# Patient Record
Sex: Female | Born: 1955 | Race: Black or African American | Hispanic: No | Marital: Married | Smoking: Never smoker
Health system: Southern US, Community
[De-identification: ages and names within clinical notes are randomized; demographics above are authoritative.]

## PROBLEM LIST (undated history)

## (undated) DIAGNOSIS — I10 Essential (primary) hypertension: Secondary | ICD-10-CM

## (undated) HISTORY — PX: EYE SURGERY: SHX253

---

## 1998-06-09 ENCOUNTER — Encounter: Admission: RE | Admit: 1998-06-09 | Discharge: 1998-06-09 | Payer: Self-pay | Admitting: Family Medicine

## 1998-07-05 ENCOUNTER — Encounter: Admission: RE | Admit: 1998-07-05 | Discharge: 1998-07-05 | Payer: Self-pay | Admitting: Family Medicine

## 1998-08-22 ENCOUNTER — Encounter: Admission: RE | Admit: 1998-08-22 | Discharge: 1998-08-22 | Payer: Self-pay | Admitting: Family Medicine

## 1998-08-29 ENCOUNTER — Encounter: Admission: RE | Admit: 1998-08-29 | Discharge: 1998-11-27 | Payer: Self-pay | Admitting: *Deleted

## 1998-09-19 ENCOUNTER — Encounter: Admission: RE | Admit: 1998-09-19 | Discharge: 1998-09-19 | Payer: Self-pay | Admitting: Family Medicine

## 1999-02-02 ENCOUNTER — Encounter: Admission: RE | Admit: 1999-02-02 | Discharge: 1999-02-02 | Payer: Self-pay | Admitting: Family Medicine

## 1999-02-07 ENCOUNTER — Encounter: Admission: RE | Admit: 1999-02-07 | Discharge: 1999-02-07 | Payer: Self-pay | Admitting: Family Medicine

## 1999-02-08 ENCOUNTER — Encounter: Admission: RE | Admit: 1999-02-08 | Discharge: 1999-02-08 | Payer: Self-pay | Admitting: Family Medicine

## 1999-05-24 ENCOUNTER — Encounter: Admission: RE | Admit: 1999-05-24 | Discharge: 1999-05-24 | Payer: Self-pay | Admitting: Family Medicine

## 1999-06-19 ENCOUNTER — Encounter: Admission: RE | Admit: 1999-06-19 | Discharge: 1999-06-19 | Payer: Self-pay | Admitting: Sports Medicine

## 1999-10-02 ENCOUNTER — Encounter: Admission: RE | Admit: 1999-10-02 | Discharge: 1999-10-02 | Payer: Self-pay | Admitting: Sports Medicine

## 1999-10-05 ENCOUNTER — Encounter: Admission: RE | Admit: 1999-10-05 | Discharge: 1999-10-05 | Payer: Self-pay | Admitting: Family Medicine

## 1999-10-31 ENCOUNTER — Encounter: Admission: RE | Admit: 1999-10-31 | Discharge: 1999-10-31 | Payer: Self-pay | Admitting: Family Medicine

## 1999-12-21 ENCOUNTER — Encounter: Admission: RE | Admit: 1999-12-21 | Discharge: 1999-12-21 | Payer: Self-pay | Admitting: Family Medicine

## 2000-01-04 ENCOUNTER — Encounter: Admission: RE | Admit: 2000-01-04 | Discharge: 2000-01-04 | Payer: Self-pay | Admitting: Family Medicine

## 2000-02-01 ENCOUNTER — Encounter: Admission: RE | Admit: 2000-02-01 | Discharge: 2000-02-01 | Payer: Self-pay | Admitting: Family Medicine

## 2000-02-18 ENCOUNTER — Encounter: Admission: RE | Admit: 2000-02-18 | Discharge: 2000-02-18 | Payer: Self-pay | Admitting: Family Medicine

## 2000-02-19 ENCOUNTER — Ambulatory Visit (HOSPITAL_COMMUNITY): Admission: RE | Admit: 2000-02-19 | Discharge: 2000-02-19 | Payer: Self-pay | Admitting: *Deleted

## 2000-05-01 ENCOUNTER — Encounter: Admission: RE | Admit: 2000-05-01 | Discharge: 2000-05-01 | Payer: Self-pay | Admitting: Family Medicine

## 2000-05-02 ENCOUNTER — Emergency Department (HOSPITAL_COMMUNITY): Admission: EM | Admit: 2000-05-02 | Discharge: 2000-05-02 | Payer: Self-pay | Admitting: Emergency Medicine

## 2000-05-28 ENCOUNTER — Encounter: Admission: RE | Admit: 2000-05-28 | Discharge: 2000-05-28 | Payer: Self-pay | Admitting: Family Medicine

## 2000-06-17 ENCOUNTER — Encounter: Admission: RE | Admit: 2000-06-17 | Discharge: 2000-06-17 | Payer: Self-pay | Admitting: Sports Medicine

## 2000-06-24 ENCOUNTER — Encounter: Admission: RE | Admit: 2000-06-24 | Discharge: 2000-06-24 | Payer: Self-pay | Admitting: Sports Medicine

## 2000-07-08 ENCOUNTER — Encounter: Admission: RE | Admit: 2000-07-08 | Discharge: 2000-07-08 | Payer: Self-pay | Admitting: Family Medicine

## 2000-07-22 ENCOUNTER — Encounter: Admission: RE | Admit: 2000-07-22 | Discharge: 2000-07-22 | Payer: Self-pay | Admitting: Family Medicine

## 2000-08-12 ENCOUNTER — Encounter: Admission: RE | Admit: 2000-08-12 | Discharge: 2000-08-12 | Payer: Self-pay | Admitting: Family Medicine

## 2000-09-11 ENCOUNTER — Encounter: Admission: RE | Admit: 2000-09-11 | Discharge: 2000-09-11 | Payer: Self-pay | Admitting: Family Medicine

## 2000-09-30 ENCOUNTER — Encounter: Admission: RE | Admit: 2000-09-30 | Discharge: 2000-09-30 | Payer: Self-pay | Admitting: Family Medicine

## 2000-11-17 ENCOUNTER — Encounter: Admission: RE | Admit: 2000-11-17 | Discharge: 2000-11-17 | Payer: Self-pay | Admitting: Family Medicine

## 2000-11-25 ENCOUNTER — Encounter: Admission: RE | Admit: 2000-11-25 | Discharge: 2000-11-25 | Payer: Self-pay | Admitting: Sports Medicine

## 2001-02-02 ENCOUNTER — Emergency Department (HOSPITAL_COMMUNITY): Admission: EM | Admit: 2001-02-02 | Discharge: 2001-02-02 | Payer: Self-pay | Admitting: Emergency Medicine

## 2001-04-24 ENCOUNTER — Encounter: Admission: RE | Admit: 2001-04-24 | Discharge: 2001-04-24 | Payer: Self-pay | Admitting: Family Medicine

## 2001-04-28 ENCOUNTER — Encounter: Admission: RE | Admit: 2001-04-28 | Discharge: 2001-04-28 | Payer: Self-pay | Admitting: Family Medicine

## 2001-04-28 ENCOUNTER — Encounter: Payer: Self-pay | Admitting: Family Medicine

## 2001-07-01 ENCOUNTER — Encounter: Admission: RE | Admit: 2001-07-01 | Discharge: 2001-07-01 | Payer: Self-pay | Admitting: Family Medicine

## 2001-10-05 ENCOUNTER — Encounter: Admission: RE | Admit: 2001-10-05 | Discharge: 2001-10-05 | Payer: Self-pay | Admitting: Sports Medicine

## 2001-10-23 ENCOUNTER — Encounter: Admission: RE | Admit: 2001-10-23 | Discharge: 2001-10-23 | Payer: Self-pay | Admitting: Family Medicine

## 2001-12-22 ENCOUNTER — Encounter: Admission: RE | Admit: 2001-12-22 | Discharge: 2002-03-22 | Payer: Self-pay | Admitting: *Deleted

## 2002-01-01 ENCOUNTER — Encounter: Admission: RE | Admit: 2002-01-01 | Discharge: 2002-01-01 | Payer: Self-pay | Admitting: Family Medicine

## 2002-01-19 ENCOUNTER — Encounter: Admission: RE | Admit: 2002-01-19 | Discharge: 2002-01-19 | Payer: Self-pay | Admitting: Family Medicine

## 2002-05-21 ENCOUNTER — Encounter: Admission: RE | Admit: 2002-05-21 | Discharge: 2002-05-21 | Payer: Self-pay | Admitting: Family Medicine

## 2002-06-10 ENCOUNTER — Encounter: Admission: RE | Admit: 2002-06-10 | Discharge: 2002-06-10 | Payer: Self-pay | Admitting: Family Medicine

## 2002-10-18 ENCOUNTER — Emergency Department (HOSPITAL_COMMUNITY): Admission: EM | Admit: 2002-10-18 | Discharge: 2002-10-18 | Payer: Self-pay | Admitting: Emergency Medicine

## 2002-10-27 ENCOUNTER — Encounter: Admission: RE | Admit: 2002-10-27 | Discharge: 2002-10-27 | Payer: Self-pay | Admitting: Family Medicine

## 2003-01-05 ENCOUNTER — Encounter: Admission: RE | Admit: 2003-01-05 | Discharge: 2003-01-05 | Payer: Self-pay | Admitting: Family Medicine

## 2003-04-04 ENCOUNTER — Encounter: Admission: RE | Admit: 2003-04-04 | Discharge: 2003-04-04 | Payer: Self-pay | Admitting: Family Medicine

## 2003-05-11 ENCOUNTER — Encounter: Admission: RE | Admit: 2003-05-11 | Discharge: 2003-05-11 | Payer: Self-pay | Admitting: Sports Medicine

## 2003-05-11 ENCOUNTER — Encounter: Payer: Self-pay | Admitting: Sports Medicine

## 2003-06-21 ENCOUNTER — Encounter: Payer: Self-pay | Admitting: Neurosurgery

## 2003-06-21 ENCOUNTER — Encounter: Admission: RE | Admit: 2003-06-21 | Discharge: 2003-06-21 | Payer: Self-pay | Admitting: Neurosurgery

## 2003-07-21 ENCOUNTER — Encounter: Admission: RE | Admit: 2003-07-21 | Discharge: 2003-07-21 | Payer: Self-pay | Admitting: Family Medicine

## 2003-09-26 ENCOUNTER — Encounter: Admission: RE | Admit: 2003-09-26 | Discharge: 2003-09-26 | Payer: Self-pay | Admitting: Family Medicine

## 2003-10-14 ENCOUNTER — Encounter: Admission: RE | Admit: 2003-10-14 | Discharge: 2003-10-14 | Payer: Self-pay | Admitting: Family Medicine

## 2003-11-09 ENCOUNTER — Emergency Department (HOSPITAL_COMMUNITY): Admission: EM | Admit: 2003-11-09 | Discharge: 2003-11-09 | Payer: Self-pay | Admitting: Emergency Medicine

## 2003-12-01 ENCOUNTER — Encounter: Admission: RE | Admit: 2003-12-01 | Discharge: 2003-12-01 | Payer: Self-pay | Admitting: Family Medicine

## 2003-12-16 ENCOUNTER — Encounter: Admission: RE | Admit: 2003-12-16 | Discharge: 2003-12-16 | Payer: Self-pay | Admitting: Family Medicine

## 2003-12-23 ENCOUNTER — Encounter: Admission: RE | Admit: 2003-12-23 | Discharge: 2003-12-23 | Payer: Self-pay | Admitting: Neurosurgery

## 2004-03-09 ENCOUNTER — Encounter (INDEPENDENT_AMBULATORY_CARE_PROVIDER_SITE_OTHER): Payer: Self-pay | Admitting: *Deleted

## 2004-03-09 LAB — CONVERTED CEMR LAB

## 2004-03-19 ENCOUNTER — Emergency Department (HOSPITAL_COMMUNITY): Admission: EM | Admit: 2004-03-19 | Discharge: 2004-03-19 | Payer: Self-pay

## 2004-03-30 ENCOUNTER — Encounter: Admission: RE | Admit: 2004-03-30 | Discharge: 2004-03-30 | Payer: Self-pay | Admitting: Family Medicine

## 2004-04-12 ENCOUNTER — Encounter: Admission: RE | Admit: 2004-04-12 | Discharge: 2004-04-12 | Payer: Self-pay | Admitting: Family Medicine

## 2004-09-07 ENCOUNTER — Ambulatory Visit: Payer: Self-pay

## 2004-12-06 ENCOUNTER — Ambulatory Visit: Payer: Self-pay | Admitting: Family Medicine

## 2005-03-25 ENCOUNTER — Ambulatory Visit: Payer: Self-pay | Admitting: Sports Medicine

## 2005-06-28 ENCOUNTER — Ambulatory Visit: Payer: Self-pay | Admitting: Family Medicine

## 2005-09-18 ENCOUNTER — Ambulatory Visit: Payer: Self-pay | Admitting: Family Medicine

## 2006-02-04 ENCOUNTER — Ambulatory Visit: Payer: Self-pay | Admitting: Family Medicine

## 2006-06-05 ENCOUNTER — Emergency Department (HOSPITAL_COMMUNITY): Admission: EM | Admit: 2006-06-05 | Discharge: 2006-06-05 | Payer: Self-pay | Admitting: Emergency Medicine

## 2006-07-02 ENCOUNTER — Ambulatory Visit: Payer: Self-pay | Admitting: Sports Medicine

## 2006-09-02 ENCOUNTER — Ambulatory Visit: Payer: Self-pay | Admitting: Family Medicine

## 2006-10-08 ENCOUNTER — Ambulatory Visit: Payer: Self-pay | Admitting: Family Medicine

## 2006-12-12 ENCOUNTER — Ambulatory Visit: Payer: Self-pay | Admitting: Family Medicine

## 2007-01-16 ENCOUNTER — Encounter (INDEPENDENT_AMBULATORY_CARE_PROVIDER_SITE_OTHER): Payer: Self-pay | Admitting: Family Medicine

## 2007-01-16 ENCOUNTER — Ambulatory Visit: Payer: Self-pay | Admitting: Family Medicine

## 2007-01-16 LAB — CONVERTED CEMR LAB
BUN: 10 mg/dL (ref 6–23)
CO2: 25 meq/L (ref 19–32)
Calcium: 9.4 mg/dL (ref 8.4–10.5)
Chloride: 102 meq/L (ref 96–112)
Creatinine, Ser: 0.74 mg/dL (ref 0.40–1.20)
Glucose, Bld: 240 mg/dL — ABNORMAL HIGH (ref 70–99)
Potassium: 3.6 meq/L (ref 3.5–5.3)
Sodium: 136 meq/L (ref 135–145)

## 2007-02-05 DIAGNOSIS — IMO0002 Reserved for concepts with insufficient information to code with codable children: Secondary | ICD-10-CM | POA: Insufficient documentation

## 2007-02-05 DIAGNOSIS — I10 Essential (primary) hypertension: Secondary | ICD-10-CM | POA: Insufficient documentation

## 2007-02-05 DIAGNOSIS — E1165 Type 2 diabetes mellitus with hyperglycemia: Secondary | ICD-10-CM | POA: Insufficient documentation

## 2007-02-05 DIAGNOSIS — E785 Hyperlipidemia, unspecified: Secondary | ICD-10-CM | POA: Insufficient documentation

## 2007-02-05 DIAGNOSIS — E1151 Type 2 diabetes mellitus with diabetic peripheral angiopathy without gangrene: Secondary | ICD-10-CM | POA: Insufficient documentation

## 2007-02-06 ENCOUNTER — Encounter (INDEPENDENT_AMBULATORY_CARE_PROVIDER_SITE_OTHER): Payer: Self-pay | Admitting: *Deleted

## 2007-10-02 ENCOUNTER — Ambulatory Visit: Payer: Self-pay | Admitting: Family Medicine

## 2007-10-02 LAB — CONVERTED CEMR LAB: Hgb A1c MFr Bld: 10.4 %

## 2007-10-23 ENCOUNTER — Encounter (INDEPENDENT_AMBULATORY_CARE_PROVIDER_SITE_OTHER): Payer: Self-pay | Admitting: Family Medicine

## 2007-10-23 ENCOUNTER — Ambulatory Visit: Payer: Self-pay | Admitting: Family Medicine

## 2007-10-23 LAB — CONVERTED CEMR LAB: Whiff Test: NEGATIVE

## 2007-10-25 LAB — CONVERTED CEMR LAB
ALT: 12 units/L (ref 0–35)
AST: 11 units/L (ref 0–37)
Albumin: 3.7 g/dL (ref 3.5–5.2)
Alkaline Phosphatase: 52 units/L (ref 39–117)
BUN: 11 mg/dL (ref 6–23)
CO2: 23 meq/L (ref 19–32)
Calcium: 9.1 mg/dL (ref 8.4–10.5)
Chloride: 105 meq/L (ref 96–112)
Cholesterol: 158 mg/dL (ref 0–200)
Creatinine, Ser: 0.85 mg/dL (ref 0.40–1.20)
Glucose, Bld: 170 mg/dL — ABNORMAL HIGH (ref 70–99)
HDL: 38 mg/dL — ABNORMAL LOW (ref >39)
LDL Cholesterol: 104 mg/dL — ABNORMAL HIGH (ref 0–99)
Potassium: 4.1 meq/L (ref 3.5–5.3)
Sodium: 139 meq/L (ref 135–145)
Total Bilirubin: 0.8 mg/dL (ref 0.3–1.2)
Total CHOL/HDL Ratio: 4.2
Total Protein: 6.9 g/dL (ref 6.0–8.3)
Triglycerides: 80 mg/dL (ref <150)
VLDL: 16 mg/dL (ref 0–40)

## 2007-11-20 ENCOUNTER — Telehealth (INDEPENDENT_AMBULATORY_CARE_PROVIDER_SITE_OTHER): Payer: Self-pay | Admitting: Family Medicine

## 2007-11-25 ENCOUNTER — Telehealth (INDEPENDENT_AMBULATORY_CARE_PROVIDER_SITE_OTHER): Payer: Self-pay | Admitting: *Deleted

## 2008-01-19 ENCOUNTER — Ambulatory Visit: Payer: Self-pay | Admitting: Family Medicine

## 2008-01-19 LAB — CONVERTED CEMR LAB
Hgb A1c MFr Bld: 10.8 %
Whiff Test: NEGATIVE

## 2008-02-01 ENCOUNTER — Telehealth (INDEPENDENT_AMBULATORY_CARE_PROVIDER_SITE_OTHER): Payer: Self-pay | Admitting: Family Medicine

## 2008-03-08 ENCOUNTER — Encounter (INDEPENDENT_AMBULATORY_CARE_PROVIDER_SITE_OTHER): Payer: Self-pay | Admitting: Family Medicine

## 2008-03-15 ENCOUNTER — Encounter: Payer: Self-pay | Admitting: *Deleted

## 2008-03-16 ENCOUNTER — Encounter (INDEPENDENT_AMBULATORY_CARE_PROVIDER_SITE_OTHER): Payer: Self-pay | Admitting: Family Medicine

## 2008-03-24 ENCOUNTER — Ambulatory Visit: Payer: Self-pay | Admitting: Family Medicine

## 2008-05-05 ENCOUNTER — Ambulatory Visit: Payer: Self-pay | Admitting: Family Medicine

## 2008-06-20 ENCOUNTER — Encounter: Payer: Self-pay | Admitting: *Deleted

## 2008-07-28 ENCOUNTER — Telehealth: Payer: Self-pay | Admitting: *Deleted

## 2008-08-29 ENCOUNTER — Encounter (INDEPENDENT_AMBULATORY_CARE_PROVIDER_SITE_OTHER): Payer: Self-pay | Admitting: Family Medicine

## 2008-09-16 ENCOUNTER — Ambulatory Visit: Payer: Self-pay | Admitting: Family Medicine

## 2008-09-16 ENCOUNTER — Encounter (INDEPENDENT_AMBULATORY_CARE_PROVIDER_SITE_OTHER): Payer: Self-pay | Admitting: Family Medicine

## 2008-09-16 LAB — CONVERTED CEMR LAB
ALT: 11 units/L (ref 0–35)
AST: 11 units/L (ref 0–37)
Albumin: 3.8 g/dL (ref 3.5–5.2)
Alkaline Phosphatase: 59 units/L (ref 39–117)
BUN: 12 mg/dL (ref 6–23)
CO2: 23 meq/L (ref 19–32)
Calcium: 8.7 mg/dL (ref 8.4–10.5)
Chloride: 103 meq/L (ref 96–112)
Cholesterol: 173 mg/dL (ref 0–200)
Creatinine, Ser: 0.89 mg/dL (ref 0.40–1.20)
Glucose, Bld: 247 mg/dL — ABNORMAL HIGH (ref 70–99)
HDL: 42 mg/dL (ref >39)
LDL Cholesterol: 118 mg/dL — ABNORMAL HIGH (ref 0–99)
Potassium: 3.7 meq/L (ref 3.5–5.3)
Sodium: 137 meq/L (ref 135–145)
Total Bilirubin: 0.5 mg/dL (ref 0.3–1.2)
Total CHOL/HDL Ratio: 4.1
Total Protein: 7 g/dL (ref 6.0–8.3)
Triglycerides: 65 mg/dL (ref <150)
VLDL: 13 mg/dL (ref 0–40)

## 2008-09-18 ENCOUNTER — Encounter (INDEPENDENT_AMBULATORY_CARE_PROVIDER_SITE_OTHER): Payer: Self-pay | Admitting: Family Medicine

## 2008-09-21 ENCOUNTER — Telehealth (INDEPENDENT_AMBULATORY_CARE_PROVIDER_SITE_OTHER): Payer: Self-pay | Admitting: *Deleted

## 2008-09-22 ENCOUNTER — Telehealth: Payer: Self-pay | Admitting: *Deleted

## 2008-10-11 ENCOUNTER — Telehealth (INDEPENDENT_AMBULATORY_CARE_PROVIDER_SITE_OTHER): Payer: Self-pay | Admitting: *Deleted

## 2008-10-12 ENCOUNTER — Encounter (INDEPENDENT_AMBULATORY_CARE_PROVIDER_SITE_OTHER): Payer: Self-pay | Admitting: *Deleted

## 2008-11-07 ENCOUNTER — Telehealth (INDEPENDENT_AMBULATORY_CARE_PROVIDER_SITE_OTHER): Payer: Self-pay | Admitting: Family Medicine

## 2008-11-27 ENCOUNTER — Encounter (INDEPENDENT_AMBULATORY_CARE_PROVIDER_SITE_OTHER): Payer: Self-pay | Admitting: Family Medicine

## 2008-12-12 ENCOUNTER — Telehealth (INDEPENDENT_AMBULATORY_CARE_PROVIDER_SITE_OTHER): Payer: Self-pay | Admitting: *Deleted

## 2008-12-13 ENCOUNTER — Telehealth: Payer: Self-pay | Admitting: *Deleted

## 2008-12-14 ENCOUNTER — Ambulatory Visit: Payer: Self-pay | Admitting: Family Medicine

## 2008-12-14 ENCOUNTER — Encounter: Payer: Self-pay | Admitting: Family Medicine

## 2008-12-14 DIAGNOSIS — R300 Dysuria: Secondary | ICD-10-CM | POA: Insufficient documentation

## 2008-12-14 LAB — CONVERTED CEMR LAB
Bilirubin Urine: NEGATIVE
Blood in Urine, dipstick: NEGATIVE
Glucose, Urine, Semiquant: 500
Hgb A1c MFr Bld: 11.3 %
Nitrite: NEGATIVE
Specific Gravity, Urine: 1.025
Urobilinogen, UA: 0.2
WBC Urine, dipstick: NEGATIVE
pH: 6

## 2008-12-15 LAB — CONVERTED CEMR LAB
BUN: 12 mg/dL (ref 6–23)
CO2: 26 meq/L (ref 19–32)
Calcium: 9.2 mg/dL (ref 8.4–10.5)
Chloride: 103 meq/L (ref 96–112)
Creatinine, Ser: 0.77 mg/dL (ref 0.40–1.20)
Glucose, Bld: 202 mg/dL — ABNORMAL HIGH (ref 70–99)
Potassium: 4 meq/L (ref 3.5–5.3)
Sodium: 139 meq/L (ref 135–145)

## 2009-02-03 ENCOUNTER — Telehealth (INDEPENDENT_AMBULATORY_CARE_PROVIDER_SITE_OTHER): Payer: Self-pay | Admitting: Family Medicine

## 2009-02-03 ENCOUNTER — Ambulatory Visit: Payer: Self-pay | Admitting: Family Medicine

## 2009-02-03 ENCOUNTER — Inpatient Hospital Stay (HOSPITAL_COMMUNITY): Admission: AD | Admit: 2009-02-03 | Discharge: 2009-02-04 | Payer: Self-pay | Admitting: Family Medicine

## 2009-02-03 ENCOUNTER — Encounter: Payer: Self-pay | Admitting: *Deleted

## 2009-02-03 DIAGNOSIS — I6350 Cerebral infarction due to unspecified occlusion or stenosis of unspecified cerebral artery: Secondary | ICD-10-CM | POA: Insufficient documentation

## 2009-02-03 LAB — CONVERTED CEMR LAB: Hgb A1c MFr Bld: 10.7 %

## 2009-02-04 ENCOUNTER — Encounter (INDEPENDENT_AMBULATORY_CARE_PROVIDER_SITE_OTHER): Payer: Self-pay | Admitting: Family Medicine

## 2009-02-04 ENCOUNTER — Emergency Department (HOSPITAL_COMMUNITY): Admission: EM | Admit: 2009-02-04 | Discharge: 2009-02-04 | Payer: Self-pay | Admitting: Emergency Medicine

## 2009-02-06 ENCOUNTER — Telehealth (INDEPENDENT_AMBULATORY_CARE_PROVIDER_SITE_OTHER): Payer: Self-pay | Admitting: Family Medicine

## 2009-02-06 ENCOUNTER — Telehealth (INDEPENDENT_AMBULATORY_CARE_PROVIDER_SITE_OTHER): Payer: Self-pay | Admitting: *Deleted

## 2009-02-07 ENCOUNTER — Ambulatory Visit: Payer: Self-pay | Admitting: Family Medicine

## 2009-02-08 ENCOUNTER — Ambulatory Visit (HOSPITAL_COMMUNITY): Admission: RE | Admit: 2009-02-08 | Discharge: 2009-02-08 | Payer: Self-pay | Admitting: Family Medicine

## 2009-02-08 ENCOUNTER — Encounter (INDEPENDENT_AMBULATORY_CARE_PROVIDER_SITE_OTHER): Payer: Self-pay | Admitting: Family Medicine

## 2009-02-08 DIAGNOSIS — I6529 Occlusion and stenosis of unspecified carotid artery: Secondary | ICD-10-CM | POA: Insufficient documentation

## 2009-02-16 ENCOUNTER — Telehealth (INDEPENDENT_AMBULATORY_CARE_PROVIDER_SITE_OTHER): Payer: Self-pay | Admitting: Family Medicine

## 2009-02-17 ENCOUNTER — Encounter (INDEPENDENT_AMBULATORY_CARE_PROVIDER_SITE_OTHER): Payer: Self-pay | Admitting: Family Medicine

## 2009-02-18 ENCOUNTER — Emergency Department (HOSPITAL_COMMUNITY): Admission: EM | Admit: 2009-02-18 | Discharge: 2009-02-18 | Payer: Self-pay | Admitting: Emergency Medicine

## 2009-02-20 ENCOUNTER — Telehealth (INDEPENDENT_AMBULATORY_CARE_PROVIDER_SITE_OTHER): Payer: Self-pay | Admitting: Family Medicine

## 2009-03-20 ENCOUNTER — Encounter (INDEPENDENT_AMBULATORY_CARE_PROVIDER_SITE_OTHER): Payer: Self-pay | Admitting: Family Medicine

## 2009-03-30 ENCOUNTER — Telehealth (INDEPENDENT_AMBULATORY_CARE_PROVIDER_SITE_OTHER): Payer: Self-pay | Admitting: Family Medicine

## 2009-03-30 ENCOUNTER — Ambulatory Visit: Payer: Self-pay | Admitting: Family Medicine

## 2009-03-30 LAB — CONVERTED CEMR LAB
Bilirubin Urine: NEGATIVE
Glucose, Urine, Semiquant: 500
Ketones, urine, test strip: NEGATIVE
Nitrite: NEGATIVE
Protein, U semiquant: NEGATIVE
Specific Gravity, Urine: 1.015
Urobilinogen, UA: 0.2
WBC Urine, dipstick: NEGATIVE
pH: 7

## 2009-03-31 ENCOUNTER — Encounter: Payer: Self-pay | Admitting: Sports Medicine

## 2009-04-03 ENCOUNTER — Encounter (INDEPENDENT_AMBULATORY_CARE_PROVIDER_SITE_OTHER): Payer: Self-pay | Admitting: Family Medicine

## 2009-04-11 ENCOUNTER — Encounter (INDEPENDENT_AMBULATORY_CARE_PROVIDER_SITE_OTHER): Payer: Self-pay | Admitting: Family Medicine

## 2009-04-11 ENCOUNTER — Telehealth (INDEPENDENT_AMBULATORY_CARE_PROVIDER_SITE_OTHER): Payer: Self-pay | Admitting: *Deleted

## 2009-04-11 ENCOUNTER — Ambulatory Visit: Payer: Self-pay | Admitting: Family Medicine

## 2009-04-11 LAB — CONVERTED CEMR LAB: Whiff Test: NEGATIVE

## 2009-04-12 LAB — CONVERTED CEMR LAB
ALT: 11 units/L (ref 0–35)
AST: 10 units/L (ref 0–37)
Albumin: 3.8 g/dL (ref 3.5–5.2)
Alkaline Phosphatase: 59 units/L (ref 39–117)
BUN: 14 mg/dL (ref 6–23)
CO2: 23 meq/L (ref 19–32)
Calcium: 8.9 mg/dL (ref 8.4–10.5)
Chloride: 105 meq/L (ref 96–112)
Cholesterol: 165 mg/dL (ref 0–200)
Creatinine, Ser: 0.84 mg/dL (ref 0.40–1.20)
Glucose, Bld: 192 mg/dL — ABNORMAL HIGH (ref 70–99)
HDL: 36 mg/dL — ABNORMAL LOW (ref >39)
LDL Cholesterol: 118 mg/dL — ABNORMAL HIGH (ref 0–99)
Potassium: 4.1 meq/L (ref 3.5–5.3)
Sodium: 138 meq/L (ref 135–145)
Total Bilirubin: 0.7 mg/dL (ref 0.3–1.2)
Total CHOL/HDL Ratio: 4.6
Total Protein: 6.7 g/dL (ref 6.0–8.3)
Triglycerides: 56 mg/dL (ref <150)
VLDL: 11 mg/dL (ref 0–40)

## 2009-04-16 ENCOUNTER — Encounter (INDEPENDENT_AMBULATORY_CARE_PROVIDER_SITE_OTHER): Payer: Self-pay | Admitting: Family Medicine

## 2009-05-16 ENCOUNTER — Encounter (INDEPENDENT_AMBULATORY_CARE_PROVIDER_SITE_OTHER): Payer: Self-pay | Admitting: Family Medicine

## 2009-06-02 ENCOUNTER — Encounter: Payer: Self-pay | Admitting: *Deleted

## 2009-06-15 ENCOUNTER — Telehealth (INDEPENDENT_AMBULATORY_CARE_PROVIDER_SITE_OTHER): Payer: Self-pay | Admitting: *Deleted

## 2009-06-19 ENCOUNTER — Ambulatory Visit: Payer: Self-pay | Admitting: Family Medicine

## 2009-07-03 ENCOUNTER — Telehealth: Payer: Self-pay | Admitting: Family Medicine

## 2009-08-03 ENCOUNTER — Telehealth: Payer: Self-pay | Admitting: Family Medicine

## 2009-08-04 ENCOUNTER — Ambulatory Visit: Payer: Self-pay | Admitting: Family Medicine

## 2009-08-04 ENCOUNTER — Encounter: Payer: Self-pay | Admitting: Family Medicine

## 2009-08-04 LAB — CONVERTED CEMR LAB
Bilirubin Urine: NEGATIVE
Blood in Urine, dipstick: NEGATIVE
Glucose, Urine, Semiquant: 500
Hgb A1c MFr Bld: 10.6 %
Ketones, urine, test strip: NEGATIVE
Nitrite: NEGATIVE
Protein, U semiquant: NEGATIVE
Specific Gravity, Urine: 1.015
Urobilinogen, UA: 0.2
WBC Urine, dipstick: NEGATIVE
Whiff Test: NEGATIVE
pH: 6

## 2009-08-07 ENCOUNTER — Encounter: Payer: Self-pay | Admitting: Family Medicine

## 2009-10-13 ENCOUNTER — Encounter: Payer: Self-pay | Admitting: Family Medicine

## 2009-10-26 ENCOUNTER — Telehealth: Payer: Self-pay | Admitting: Family Medicine

## 2009-12-04 ENCOUNTER — Telehealth: Payer: Self-pay | Admitting: Family Medicine

## 2010-01-04 ENCOUNTER — Ambulatory Visit: Payer: Self-pay | Admitting: Family Medicine

## 2010-01-04 DIAGNOSIS — H6980 Other specified disorders of Eustachian tube, unspecified ear: Secondary | ICD-10-CM | POA: Insufficient documentation

## 2010-01-05 ENCOUNTER — Ambulatory Visit: Payer: Self-pay | Admitting: Family Medicine

## 2010-01-05 LAB — CONVERTED CEMR LAB
Bilirubin Urine: NEGATIVE
Glucose, Urine, Semiquant: 500
Ketones, urine, test strip: NEGATIVE
Nitrite: NEGATIVE
Protein, U semiquant: NEGATIVE
Specific Gravity, Urine: <1.005
Urobilinogen, UA: 0.2
WBC Urine, dipstick: NEGATIVE
pH: 6

## 2010-01-06 ENCOUNTER — Encounter: Payer: Self-pay | Admitting: Family Medicine

## 2010-01-09 ENCOUNTER — Telehealth (INDEPENDENT_AMBULATORY_CARE_PROVIDER_SITE_OTHER): Payer: Self-pay | Admitting: *Deleted

## 2010-01-10 ENCOUNTER — Telehealth: Payer: Self-pay | Admitting: *Deleted

## 2010-01-23 ENCOUNTER — Ambulatory Visit: Payer: Self-pay | Admitting: Family Medicine

## 2010-01-23 DIAGNOSIS — L293 Anogenital pruritus, unspecified: Secondary | ICD-10-CM | POA: Insufficient documentation

## 2010-01-23 LAB — CONVERTED CEMR LAB: Whiff Test: NEGATIVE

## 2010-04-17 ENCOUNTER — Telehealth: Payer: Self-pay | Admitting: Family Medicine

## 2010-04-26 ENCOUNTER — Encounter: Payer: Self-pay | Admitting: Family Medicine

## 2010-04-26 ENCOUNTER — Ambulatory Visit: Payer: Self-pay | Admitting: Family Medicine

## 2010-04-26 LAB — CONVERTED CEMR LAB
ALT: 16 units/L (ref 0–35)
AST: 16 units/L (ref 0–37)
Albumin: 4.1 g/dL (ref 3.5–5.2)
Alkaline Phosphatase: 103 units/L (ref 39–117)
BUN: 15 mg/dL (ref 6–23)
Bilirubin Urine: NEGATIVE
Blood in Urine, dipstick: NEGATIVE
CO2: 24 meq/L (ref 19–32)
Calcium: 9.1 mg/dL (ref 8.4–10.5)
Chloride: 98 meq/L (ref 96–112)
Cholesterol: 192 mg/dL (ref 0–200)
Creatinine, Ser: 0.83 mg/dL (ref 0.40–1.20)
Glucose, Bld: 264 mg/dL — ABNORMAL HIGH (ref 70–99)
Glucose, Urine, Semiquant: 500
HDL: 42 mg/dL (ref >39)
Hgb A1c MFr Bld: 12.5 %
Ketones, urine, test strip: NEGATIVE
LDL Cholesterol: 139 mg/dL — ABNORMAL HIGH (ref 0–99)
Nitrite: NEGATIVE
Potassium: 3.7 meq/L (ref 3.5–5.3)
Sodium: 135 meq/L (ref 135–145)
Specific Gravity, Urine: 1.02
Total Bilirubin: 0.7 mg/dL (ref 0.3–1.2)
Total CHOL/HDL Ratio: 4.6
Total Protein: 7 g/dL (ref 6.0–8.3)
Triglycerides: 55 mg/dL (ref <150)
Urobilinogen, UA: 0.2
VLDL: 11 mg/dL (ref 0–40)
WBC Urine, dipstick: NEGATIVE
pH: 6

## 2010-05-03 ENCOUNTER — Encounter: Payer: Self-pay | Admitting: Family Medicine

## 2010-05-03 ENCOUNTER — Telehealth: Payer: Self-pay | Admitting: Family Medicine

## 2010-05-08 ENCOUNTER — Telehealth: Payer: Self-pay | Admitting: Family Medicine

## 2010-07-31 ENCOUNTER — Ambulatory Visit: Payer: Self-pay | Admitting: Family Medicine

## 2010-08-01 ENCOUNTER — Telehealth: Payer: Self-pay | Admitting: Family Medicine

## 2010-08-02 ENCOUNTER — Telehealth: Payer: Self-pay | Admitting: Family Medicine

## 2010-08-02 ENCOUNTER — Ambulatory Visit: Payer: Self-pay | Admitting: Family Medicine

## 2010-08-02 LAB — CONVERTED CEMR LAB
Bilirubin Urine: NEGATIVE
Blood in Urine, dipstick: NEGATIVE
Glucose, Urine, Semiquant: 500
Ketones, urine, test strip: NEGATIVE
Nitrite: NEGATIVE
Specific Gravity, Urine: 1.025
Urobilinogen, UA: 0.2
WBC Urine, dipstick: NEGATIVE
pH: 6

## 2010-08-03 ENCOUNTER — Telehealth: Payer: Self-pay | Admitting: *Deleted

## 2010-11-05 ENCOUNTER — Telehealth: Payer: Self-pay | Admitting: Family Medicine

## 2010-11-06 ENCOUNTER — Ambulatory Visit: Payer: Self-pay | Admitting: Family Medicine

## 2010-11-06 DIAGNOSIS — Z780 Asymptomatic menopausal state: Secondary | ICD-10-CM | POA: Insufficient documentation

## 2010-11-07 ENCOUNTER — Telehealth: Payer: Self-pay | Admitting: *Deleted

## 2010-11-15 ENCOUNTER — Ambulatory Visit: Payer: Self-pay | Admitting: Family Medicine

## 2010-11-15 LAB — CONVERTED CEMR LAB
Bilirubin Urine: NEGATIVE
Blood in Urine, dipstick: NEGATIVE
Glucose, Urine, Semiquant: 500
Ketones, urine, test strip: NEGATIVE
Nitrite: NEGATIVE
Protein, U semiquant: NEGATIVE
Specific Gravity, Urine: 1.02
Urobilinogen, UA: 0.2
WBC Urine, dipstick: NEGATIVE
pH: 6.5

## 2010-11-27 ENCOUNTER — Ambulatory Visit: Payer: Self-pay

## 2010-11-27 ENCOUNTER — Emergency Department (HOSPITAL_COMMUNITY)
Admission: EM | Admit: 2010-11-27 | Discharge: 2010-11-27 | Payer: Self-pay | Source: Home / Self Care | Admitting: Emergency Medicine

## 2010-11-28 ENCOUNTER — Encounter: Payer: Self-pay | Admitting: Family Medicine

## 2010-12-29 ENCOUNTER — Encounter: Payer: Self-pay | Admitting: Neurosurgery

## 2010-12-30 ENCOUNTER — Encounter: Payer: Self-pay | Admitting: Cardiovascular Disease

## 2011-01-03 ENCOUNTER — Telehealth (INDEPENDENT_AMBULATORY_CARE_PROVIDER_SITE_OTHER): Payer: Self-pay | Admitting: *Deleted
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
## 2011-01-10 NOTE — Progress Notes (Signed)
Summary: Rx Req  Phone Note Refill Request Call back at Home Phone [phone] Message from:  Patient  Refills Requested: Medication #1:  NORVASC 10 MG TABS Take 1 tablet by mouth once a day  Medication #2:  HUMULIN N 100 UNIT/ML SUSP 38 units subcutaneously in am and 15 units in pm. [facility].  Initial call taken by: Clydell Hakim,  Apr 17, 2010 10:34 AM  Follow-up for Phone Call         will forward to Dr. Jennette Kettle. Follow-up by: Theresia Lo RN,  Apr 17, 2010 10:58 AM    Prescriptions: NORVASC 10 MG TABS (AMLODIPINE BESYLATE) Take 1 tablet by mouth once a day  #30 x 0   Entered by:   Theresia Lo RN   Authorized by:   Denny Levy MD   Signed by:   Theresia Lo RN on 04/17/2010   Method used:   Telephoned to ...       [facility] [address] [phone]* (retail)       [address]       [place], [place]  [address]       Ph: [phone] or [phone]       Fax: [phone]   RxID:   [id number]  Larita Fife I will fill one month but she was supposed to f/u 2 w after Feb ov and no showed---so she needs appt. Dr Burnadette Pop has seen her for that visit so he would be a good f/u choice Thanks!   Denny Levy MD  Apr 17, 2010 2:23 PM   appointment scheduled with Dr. Burnadette Pop for 04/26/2010.  patient had refills on the insulin. Rx called in for Norvasc. Theresia Lo RN  Apr 17, 2010 4:50 PM

## 2011-01-10 NOTE — Progress Notes (Signed)
Summary: test results  Phone Note Outgoing Call   Call placed by: Magnus Ivan MD,  May 08, 2010 3:57 PM Call placed to: Patient Details for Reason: need to put patient on a statin Summary of Call: called patient and told her about her high ldl cholesterol and high glucose and advised her to come and see me with an appointment so that we can put her on a statin to help because of her diabetes and her past CVA.  will send a letter to patient about results as per her requests.

## 2011-01-10 NOTE — Assessment & Plan Note (Signed)
Summary: f/u chronic issues   Vital Signs:  Patient profile:   55 year old female Weight:      145.6 pounds Temp:     98 degrees F oral Pulse rate:   84 / minute Pulse rhythm:   regular BP sitting:   178 / 97  (left arm)  Vitals Entered By: Loralee Pacas CMA (Apr 26, 2010 8:35 AM)  Serial Vital Signs/Assessments:  Time      Position  BP       Pulse  Resp  Temp     By                     170/88                         Marisue Ivan  MD  Comments: right arm, manual, sitting By: Marisue Ivan  MD   CC: follow-up visit Is Patient Diabetic? Yes Did you bring your meter with you today? No Comments pt states that she wanted to have urine checked    Primary Care Provider:  Magnus Ivan MD  CC:  follow-up visit.  History of Present Illness: 55yo F here for f/u HTN and DM  HTN: No adverse effects from medication.  Checking it regularly and states it ranges in the 120-130s/70-80s.  Was not well controlled at last visit.  No dizziness, HA, CP, palpitations, or swelling.  DM:  Currently on Humulin N 57u in AM and 15u in PM.  No adverse effects. Rare hypoglycemic events.  No new paresthesia or peripheral nerve pain.  CBGs checked 3-4 times a day and typically run b/w 170-190s with occasional readings up to 270s.  Due for diabetic eye exam.  HLD: Stopped taking the simvastatin b/c states it made her "feel funny and not well".  Denies any muscle pain or cramps.  No abd pain.  Currently taking Omega 3 supplements.  Last LDL 118 (04/2009)    Habits & Providers  Alcohol-Tobacco-Diet     Tobacco Status: never  Current Medications (verified): 1)  Aspirin 81 Mg Tbec (Aspirin) .... Take 1 Tablet By Mouth Once A Day 2)  Humulin N 100 Unit/ml Susp (Insulin Isophane Human) .... 38 Units Subcutaneously in Am and 15 Units in Pm. Increase The Nightime Dose By 1 Unit Each Night Until Your Am Blood Sugar Is Less Than 130. 3)  Metoprolol Tartrate 50 Mg Tabs (Metoprolol Tartrate) .Marland Kitchen..  1 Tab By Mouth Daily 4)  Norvasc 10 Mg Tabs (Amlodipine Besylate) .... Take 1 Tablet By Mouth Once A Day 5)  Bd Insulin Syringe Ultrafine 30g X 1/2" 0.5 Ml  Misc (Insulin Syringe-Needle U-100) .... Inject Subcutaneously Two Times A Day As  Directed 6)  Accu-Chek Compact   Strp (Glucose Blood) .... Use 4 Times A Day As Directed Disp: Qs X One Month 7)  Hydrochlorothiazide 25 Mg Tabs (Hydrochlorothiazide) .... Take 1/2 Tablet Two Times A Day 8)  Metformin Hcl 500 Mg Tabs (Metformin Hcl) .Marland Kitchen.. 1 Tablet By Mouth Every Morning With Food  Allergies (verified): 1)  * Hyzaar 2)  Diovan  Review of Systems      See HPI  Physical Exam  General:  VS Reviewed. Well appearing, NAD.  Neck:  supple, full ROM, no goiter or mass  Lungs:  Normal respiratory effort, chest expands symmetrically. Lungs are clear to auscultation, no crackles or wheezes. Heart:  Normal rate and regular rhythm. S1 and S2 normal without gallop,  murmur, click, rub or other extra sounds. Abdomen:  Soft, NT, ND, no HSM, active BS  Extremities:  no edema Neurologic:  no focal deficits   Impression & Recommendations:  Problem # 1:  HYPERTENSION, BENIGN SYSTEMIC (ICD-401.1) Assessment Unchanged  Not well controlled in the office despite nl home readings.  Pt reluctant to change any BP meds. Hx of intolerance of ACE-I's per pt. She was previously taking HCTZ 12.5mg  daily.  Will inc to two times a day. Check CMET today. F/U with Dr. Mayford Knife in 2 weeks to reassess.  Her updated medication list for this problem includes:    Metoprolol Tartrate 50 Mg Tabs (Metoprolol tartrate) .Marland Kitchen... 1 tab by mouth daily    Norvasc 10 Mg Tabs (Amlodipine besylate) .Marland Kitchen... Take 1 tablet by mouth once a day    Hydrochlorothiazide 25 Mg Tabs (Hydrochlorothiazide) .Marland Kitchen... 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 A1c  12.5%.   Will inc bedtime dosing by 1 unit each night until morning glucose is less than 130. Will also start metformin 500mg . Will reassess in 2 weeks.  Her updated medication list for this problem includes:    Aspirin 81 Mg Tbec (Aspirin) .Marland Kitchen... Take 1 tablet by mouth once a day    Humulin N 100 Unit/ml Susp (Insulin isophane human) .Marland KitchenMarland KitchenMarland KitchenMarland Kitchen 38 units subcutaneously in am and 15 units in pm. increase the nightime dose by 1 unit each night until your am blood sugar is less than 130.    Metformin Hcl 500 Mg Tabs (Metformin hcl) .Marland Kitchen... 1 tablet by mouth every morning with food  Orders: A1C-[facility] (47829) Ophthalmology Referral (Ophthalmology) [facility]- Est  Level 4 (56213)  Problem # 3:  HYPERLIPIDEMIA (ICD-272.4) Assessment: Unchanged Checking FLP and LFTs today. Pt not willing to take a statin but she needs it b/c of her DM and CAD risk factors.  The following medications were removed from the medication list:    Simvastatin 20 Mg Tabs (Simvastatin) .Marland Kitchen... 1 tab by mouth at bedtime  Orders: Lipid-[facility] ([id number]) [facility]- Est  Level 4 (29528)  Complete Medication List: 1)  Aspirin 81 Mg Tbec (Aspirin) .... Take 1 tablet by mouth once a day 2)  Humulin N 100 Unit/ml Susp (Insulin isophane human) .... 38 units subcutaneously in am and 15 units in pm. increase the nightime dose by 1 unit each night until your am blood sugar is less than 130. 3)  Metoprolol Tartrate 50 Mg Tabs (Metoprolol tartrate) .Marland Kitchen.. 1 tab by mouth daily 4)  Norvasc 10 Mg Tabs (Amlodipine besylate) .... Take 1 tablet by mouth once a day 5)  Bd Insulin Syringe Ultrafine 30g X 1/2" 0.5 Ml Misc (Insulin syringe-needle u-100) .... Inject subcutaneously two times a day as  directed 6)  Accu-chek Compact Strp (Glucose blood) .... Use 4 times a day as directed disp: qs x one month 7)  Hydrochlorothiazide 25 Mg Tabs (Hydrochlorothiazide) .... Take 1/2 tablet two times a day 8)  Metformin Hcl 500 Mg Tabs (Metformin hcl) .Marland Kitchen.. 1 tablet by  mouth every morning with food  Other Orders: Urinalysis-[facility] (00000) Urine Culture-[facility] ([id number]) Comp Met-[facility] ([id number])  Patient Instructions: 1)  Please schedule a follow-up appointment in 2 weeks with Dr. Mayford Knife. 2)  Changes today: 1. Increase the nighttime insulin by 1 unit every night until morning glucose is less than 130.  2.  Start the new med called  Metformin 3. HCTZ take 1/2 two times a day  Prescriptions: METFORMIN HCL 500 MG TABS (METFORMIN HCL) 1 tablet by mouth every morning with food  #30 x 1   Entered and Authorized by:   Marisue Ivan  MD   Signed by:   Marisue Ivan  MD on 04/26/2010   Method used:   [facility] to        [facility] [address] [phone]* (retail)       [address]       [place], [place]  [address]       Ph: [phone] or [phone]       Fax: [phone]   RxID:   [id number]   Laboratory Results   Urine Tests  Date/Time Received: Apr 26, 2010 8:57 AM  Date/Time Reported: Apr 26, 2010 9:28 AM   Routine Urinalysis   Color: yellow Appearance: Clear Glucose: 500   (Normal Range: Negative) Bilirubin: negative   (Normal Range: Negative) Ketone: negative   (Normal Range: Negative) Spec. Gravity: 1.020   (Normal Range: 1.003-1.035) Blood: negative   (Normal Range: Negative) pH: 6.0   (Normal Range: 5.0-8.0) Protein: trace   (Normal Range: Negative) Urobilinogen: 0.2   (Normal Range: 0-1) Nitrite: negative   (Normal Range: Negative) Leukocyte Esterace: negative   (Normal Range: Negative)    Comments: urine sent for culture ...........test performed by...........Marland KitchenTerese Door, CMA'  Blood Tests   Date/Time Received: Apr 26, 2010 8:37 AM  Date/Time Reported: Apr 26, 2010 8:58 AM   HGBA1C: 12.5%   (Normal Range: Non-Diabetic - 3-6%   Control Diabetic - 6-8%)  Comments: ...............test performed by......Marland KitchenBonnie A. [place], MLS (ASCP)cm       Prevention & Chronic  Care Immunizations   Influenza vaccine: Not documented   Influenza vaccine due: Refused  (12/14/2008)    Tetanus booster: 04/08/1993: Done.   Tetanus booster due: 04/09/2003    Pneumococcal vaccine: Not documented  Colorectal Screening   Hemoccult: Done.  (08/09/1997)   Hemoccult due: 08/09/1998    Colonoscopy: Not documented  Other Screening   Pap smear: NEGATIVE FOR INTRAEPITHELIAL LESIONS OR MALIGNANCY.  (04/11/2009)   Pap smear due: 03/09/2005    Mammogram: Done.  (12/09/2002)   Mammogram due: 12/10/2003   Smoking status: never  (04/26/2010)  Diabetes Mellitus   HgbA1C: 12.5  (04/26/2010)   Hemoglobin A1C due: 04/17/2008    Eye exam: Not documented   Diabetic eye exam action/deferral: Ophthalmology referral  (04/26/2010)    Foot exam: yes  (10/02/2007)   Foot exam action/deferral: Do today   High risk foot: Not documented   Foot care education: Not documented   Foot exam due: 10/01/2008    Urine microalbumin/creatinine ratio: Not documented    Diabetes flowsheet reviewed?: Yes   Progress toward A1C goal: Deteriorated  Lipids   Total Cholesterol: 165  (04/11/2009)   LDL: 118  (04/11/2009)   LDL Direct: Not documented   HDL: 36  (04/11/2009)   Triglycerides: 56  (04/11/2009)    SGOT (AST): 10  (04/11/2009)   SGPT (ALT): 11  (04/11/2009) CMP ordered    Alkaline phosphatase: 59  (04/11/2009)   Total bilirubin: 0.7  (04/11/2009)  Hypertension   Last Blood Pressure: 178 / 97  (04/26/2010)   Serum creatinine: 0.84  (04/11/2009)   Serum potassium 4.1  (04/11/2009) CMP ordered     Hypertension flowsheet reviewed?: Yes   Progress toward BP goal: Unchanged  Self-Management Support :  Personal Goals (by the next clinic visit) :     Personal A1C goal: 8  (04/26/2010)     Personal blood pressure goal: 140/90  (04/26/2010)     Personal LDL goal: 100  (04/26/2010)    Patient will work on the following items until the next clinic visit to reach self-care  goals:     Medications and monitoring: take my medicines every day, check my blood sugar, check my blood pressure, bring all of my medications to every visit  (04/26/2010)     Eating: drink diet soda or water instead of juice or soda, eat more vegetables, use fresh or frozen vegetables, eat foods that are low in salt, eat baked foods instead of fried foods, eat fruit for snacks and desserts  (04/26/2010)    Diabetes self-management support: Copy of home glucose meter record, CBG self-monitoring log, Written self-care plan, Education handout  (04/26/2010)   Diabetes care plan printed   Diabetes education handout printed    Hypertension self-management support: Copy of home glucose meter record, CBG self-monitoring log, Written self-care plan  (04/26/2010)   Hypertension self-care plan printed.    Lipid self-management support: Written self-care plan  (04/26/2010)   Lipid self-care plan printed.   Nursing Instructions: Refer for screening diabetic eye exam (see order) Diabetic foot exam today    Diabetic Foot Exam Foot Inspection Is there a history of a foot ulcer?              No Is there a foot ulcer now?              No Can the patient see the bottom of their feet?          Yes Are the shoes appropriate in style and fit?          Yes Is there swelling or an abnormal foot shape?          No Are the toenails long?                No Are the toenails thick?                No Are the toenails ingrown?              No Is there heavy callous build-up?              No Is there pain in the calf muscle (Intermittent claudication) when walking?    NoIs there a claw toe deformity?              No Is there elevated skin temperature?            No Is there limited ankle dorsiflexion?            No Is there foot or ankle muscle weakness?            No  Diabetic Foot Care Education    10-g (5.07) Semmes-Weinstein Monofilament Test           Right Foot          Left Foot Visual Inspection      normal           normal Site 1         normal         normal Site 2         normal         normal Site 3  normal         normal Site 4         normal         normal Site 5         normal         normal Site 6         normal         normal

## 2011-01-10 NOTE — Progress Notes (Signed)
Summary: [facility] lab results  Phone Note Call from Patient Call back at Home Phone [phone]   Summary of Call: pt would like to discuss lab results from last week. Initial call taken by: Haydee Salter,  September 21, 2008 10:42 AM  Follow-up for Phone Call        pt will wait for letter and will make an appt. She was notified that an rx was sent to the pharmacy for her wet prep results Follow-up by: Alphia Kava,  September 21, 2008 1:57 PM

## 2011-01-10 NOTE — Progress Notes (Signed)
Summary: Rx  Phone Note Call from Patient Call back at Home Phone [phone]   Reason for Call: Talk to Nurse Summary of Call: pt is calling re: an rx that was suppose to be called in, pt not sure what it was. Initial call taken by: Knox Royalty,  September 22, 2008 11:36 AM  Follow-up for Phone Call        patient reports pharmacy does not have RX for  Metronidazole. contacted pharmacy and this Rx is ready for her to pick up. she has already picked up Diovan. read her contents of letter MD  is sending her and explained she should receive in mail soon. Follow-up by: Theresia Lo RN,  September 22, 2008 11:46 AM

## 2011-01-10 NOTE — Assessment & Plan Note (Signed)
Summary: f/u uti & vag burning   Vital Signs:  Patient profile:   55 year old female Height:      66 inches Weight:      146.6 pounds Temp:     98.3 degrees F Pulse rate:   83 / minute BP sitting:   170 / 96  (right arm)  Vitals Entered By: Theresia Lo RN (March 30, 2009 1:40 PM) CC: follow up UTI from three weeks ago, still has some dysuria, vaginal irriation Is Patient Diabetic? No   History of Present Illness: 83 female with DM, HTN, HLD seen as a work in.  Pt has had burning during urination for approx 3 weeks now.  Also associated with vaginal burning and itching.  No discharge.  No urinary frequency.  No change in color or odor of urine.  No association with menstrual period (no menopause yet).  Pt recently went to ER for UTI.  Was given Macrobid.  Completed course but has had no change in symptoms.  UA done in ER but no UCx.  Pt states she has had these same symptoms for a long time.  UA done today which was not consistent with UTI/cystitis.  When I informed pt that she would need a pelvic exam and wet prep she refused. Offered to self-wet prep but lab does not accept these.  Stated she would come back and have her PCP to this.  No HA, SOB, CP, fevers/chills/N/V/D/C.  No current bleeding or discharge.  No flank pain, no abd pain.  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 Lives with husband and 4 children. No alcohol,  tobacco, IVDA.   Husband is [place].  Neither like to take meds and rather use herbal meds (09/16/2008)  Review of Systems       12 point negative except as above in HPI.  Physical Exam  General:  Well-developed,well-nourished,in no acute distress; alert,appropriate and cooperative throughout examination Lungs:  Normal respiratory effort, chest expands symmetrically. Lungs are clear to auscultation, no crackles or wheezes. Heart:  Normal rate and regular rhythm. S1 and S2 normal without gallop, murmur, click, rub or other extra sounds. Abdomen:  Bowel sounds positive,abdomen soft and non-tender without masses, organomegaly or hernias noted.  No CVA tenderness.   Impression & Recommendations:  Problem # 1:  DYSURIA (ICD-788.1) With recurrent UTI symptoms and Hx recurrent BV, would need pelvic exam and wet prep when pt ready at next appt.  Likely etiology is DM.  Would also need to follow up on UCx checked with today's UA.  If negative UCx and wet prep, and no signs atrophic vaginitis/lichen planus/lichen simplex or other lesions on pelvic exam, would consider interstitial cystitis.  Pt would need Urology referral if we get to this point.  Orders: [facility]- Est Level  3 (16109) Urinalysis-[facility] (00000) Urine Culture-[facility] ([id number])  Complete Medication List: 1)  Aspirin 81 Mg Tbec (Aspirin) .... Take 1 tablet by mouth once a day 2)  Humulin N 100 Unit/ml Susp (Insulin isophane human) .... 38 units subcutaneously in am and 20 units in pm. 3)  Metoprolol Succinate 25 Mg Tb24 (Metoprolol succinate) .... Take 1 tablet once a day 4)  Norvasc 10 Mg Tabs (Amlodipine besylate) .... Take 1 tablet by mouth once a day 5)  Bd Insulin Syringe Ultrafine 30g X 1/2" 0.5 Ml Misc (Insulin syringe-needle u-100) .... Inject subcutaneously two times a day as  directed 6)  Diovan Hct 160-12.5 Mg Tabs (Valsartan-hydrochlorothiazide) .Marland Kitchen.. 1 tab by mouth daily 7)  Accu-chek Compact Strp (Glucose blood) .... Use 4  times a day as directed disp: qs x one month  Patient Instructions: 1)  Good to meet you today, 2)  Its too bad that you do not want a pelvic/wet prep today.  You should still come back at Dr. Aretha Parrot next slot and have her do a full pelvic exam and wet prep.   3)  -Dr. Karie Schwalbe.  Laboratory Results   Urine Tests  Date/Time Received: March 30, 2009 2:14 PM  Date/Time Reported: March 30, 2009 2:29 PM   Routine Urinalysis   Color: yellow Appearance: Clear Glucose: 500   (Normal Range: Negative) Bilirubin: negative   (Normal Range: Negative) Ketone: negative   (Normal Range: Negative) Spec. Gravity: 1.015   (Normal Range: 1.003-1.035) Blood: small   (Normal Range: Negative) pH: 7.0   (Normal Range: 5.0-8.0) Protein: negative   (Normal Range: Negative) Urobilinogen: 0.2   (Normal Range: 0-1) Nitrite: negative   (Normal Range: Negative) Leukocyte Esterace: negative   (Normal Range: Negative)  Urine Microscopic WBC/HPF: rare RBC/HPF: rare Bacteria/HPF: trace Epithelial/HPF: 0-3    Comments: Urine sent for culture ...........test performed by...........Marland KitchenTerese Door, CMA

## 2011-01-10 NOTE — Miscellaneous (Signed)
Summary: prevention  Clinical Lists Changes  Observations: Added new observation of TDBOOSTDUE: 04/09/2003 (11/27/2008 10:50) Added new observation of HDLNXTDUE: 09/16/2009 (11/27/2008 10:50) Added new observation of LDLNXTDUE: 09/16/2009 (11/27/2008 10:50) Added new observation of HEMOCULTDUE: 08/09/1998 (11/27/2008 10:50) Added new observation of PAP DUE: 03/09/2005 (11/27/2008 10:50) Added new observation of MAMMO DUE: 12/10/2003 (11/27/2008 10:50) Added new observation of DB FT EX DUE: 10/01/2008 (11/27/2008 10:50) Added new observation of HGBA1CNXTDUE: 04/17/2008 (11/27/2008 10:50) Added new observation of CREATNXTDUE: 09/16/2009 (11/27/2008 10:50) Added new observation of POTASSIUMDUE: 09/16/2009 (11/27/2008 10:50)     Last HDL:  42 (09/16/2008 8:55:00 PM) HDL Next Due:  1 yr Last LDL:  118 (09/16/2008 8:55:00 PM) LDL Next Due:  1 yr

## 2011-01-10 NOTE — Progress Notes (Signed)
  Phone Note Outgoing Call   Details for Reason: Keep appt Summary of Call: Please have patient keep appointment 3/2 pror to her MRA so i can discuss DM and HTN Initial call taken by: Johney Maine MD,  February 06, 2009 3:12 PM  Follow-up for Phone Call        pt notified Follow-up by: Alphia Kava,  February 06, 2009 3:14 PM

## 2011-01-10 NOTE — Miscellaneous (Signed)
Summary: Orders Update  Clinical Lists Changes  Orders: Added new Test order of MRI with Contrast (MRI w/Contrast) - Signed 

## 2011-01-10 NOTE — Progress Notes (Signed)
Summary: Medication confusion  Phone Note Call from Patient Call back at Home Phone [phone]   Summary of Call: Pt states she thought Dr. Karn Pickler was going to lower her Hyzaar dosage, but when she picked up rx earlier this month it was still the same dosage.  Pt would like to speak with someone about this to see what to do. Initial call taken by: Haydee Salter,  November 20, 2007 11:56 AM  Follow-up for Phone Call        she is unclear about hyzaar dose. was given sample of a lower dose, has rx ready for p/u at pharmacy for usual dose she had been on. should she resume the higher dose? message to pcp for clarification Follow-up by: Golden Circle RN,  November 20, 2007 11:57 AM  Additional Follow-up for Phone Call Additional follow up Details #1::        Pt was given samples of a lower dose of Hyzaar and told to increase her amlodipine at last visit.  She was to have kept a BP diary and followed up in one month but DNKA this past week.  I had received a refill request from pharmacy for her previous rx.  Because she did not keep her appt and the last BP I have is very high, she should continue at the Hyzaar dose she had been on prior to samples.  Should continue with amlodpine.  Needs to reschedule as I will not continue to refill meds without knowing patients blood pressure. Pt also missed a Pharm clinic appt which needs to be rescheduled as pt's diabetes is very poorly controlled and pt wanted to talk to them about starting Lantus.    Please notify pt. Additional Follow-up by: Johney Maine, MD    Additional Follow-up for Phone Call Additional follow up Details #2::    Informed pt of the above message and set her up an appt with Dr. Karn Pickler on 01/08/08 @ 2:00pm. Follow-up by: Haydee Salter,  November 23, 2007 10:42 AM

## 2011-01-10 NOTE — Progress Notes (Signed)
  Phone Note Outgoing Call Call back at [facility] Phone [phone]   Call placed by: Johney Maine MD,  February 20, 2009 12:13 PM Details for Reason: returning call Summary of Call: went to vascular MD.  Given zocor 20mg   and will start.  Also MD wanted to increase beta blocker to 50mg .  Wants her to do stress test.  Didn't talk about surgery.  Says thy did something to neck and said it wasn't her neck causing problem.  But then said the tech couldn't hear anything in her neck. She does not seem to have clear understanding as to what happened at appt..  Doesn't want to start Lantus b/c has too much going on. ADvised her that it is IMPORTANT to start this to get better glucose control.  Needs appt ASAP once her other issues are resolved.   Will try and get records.   Initial call taken by: Johney Maine MD,  February 20, 2009 12:26 PM  Follow-up for Phone Call        Can we call [facility] and get records from last weeks appt? patient does not understand what occured at appt.   Follow-up by: Johney Maine MD,  February 20, 2009 12:27 PM  Additional Follow-up for Phone Call Additional follow up Details #1::        Called Dr. Hazle Coca office and records are to be faxed today.  Let me know if you want me to schedule a f/u appointment. Additional Follow-up by: Dennison Nancy RN,  February 20, 2009 2:30 PM

## 2011-01-10 NOTE — Progress Notes (Signed)
Summary: Rx issue  Phone Note Call from Patient Call back at Home Phone [phone]   Summary of Call: Pt states wrong dose of norvasc was called into her pharmacy.  Pt takes 5mg  and this refill was for 10 mg.  Pt uses [facility] on [address]. Initial call taken by: Haydee Salter,  February 01, 2008 10:13 AM  Follow-up for Phone Call        Pt should have been taking 10mg  of Norvasc. This has been discussed at 2 office visits, the first one back in October.  She probably was supposed to have taken 2 5mg  until they ran out.  She should take ONE 10mg  pill daily. She also needs to make an appt with Pharm clinic to discuss her insulin and HTN regimine.  She was supposed to have made this appt after last visit. It is impt she makes this as her A1C continues to increase.  Please notify her of above Follow-up by: Johney Maine MD,  February 01, 2008 9:01 PM  Additional Follow-up for Phone Call Additional follow up Details #1::        Advised pt that she is supposed to be taking the norvasc 10 mg per day as discussed at the last 2 office.  Pt states when she took norvasc 5 mg and hyzaar 100/25mg  she got dizzy - she was scared to take the 10 mg of norvasc.  She says she was told by Dr. Karn Pickler to take 1/2 tab of hyzaar 100/25 mg and the norvasc 10 mg so this is what she is going to try to eliminate her dizziness.  She states that taking 1/2 of the hyzaar 100/25 mg causes her dizziness to go away.  Advised will give Dr. Karn Pickler this information to review and we will be in touch if changes need to be made.  Scheduled pt for pharm clinic 03/01/08 and to see Dr. Karn Pickler 03/08/08. Additional Follow-up by: AMY MARTIN RN,  February 02, 2008 11:00 AM    Additional Follow-up for Phone Call Additional follow up Details #2::    Compliance and understanding continue to be issues with this patient.  At last office visit pt stated she was taking a whole Hyzaar b/c it was no longer causing dizziness, NOrvasc, and metoprolol.   It seems that pt doesn not truly understand the regimine.  Pt has been scheduled for Pharm clinc to help sort out HTN meds as well as hopefully transition pt to long acting insulin, which she has refused in the past.  I will not make any further changes as it will only cloud the picture. Follow-up by: Johney Maine MD,  February 02, 2008 12:06 PM

## 2011-01-10 NOTE — Progress Notes (Signed)
Summary: request to speak with MD  Phone Note Call from Patient Call back at Home Phone [phone]   Reason for Call: Talk to Doctor Summary of Call: pt is requesting to speak with MD re: some things that are going on with her, did not want to give any other details Initial call taken by: ERIN LEVAN,  November 25, 2007 2:10 PM  Follow-up for Phone Call        Pt. states she had samples of lower dose of Hyzarr which she felt better with.  Now she only has the original higher dose and she feels dizzy and dehydrated.  Wants Rx for the lower dose.  Knows she missed a f/u appt., but had personal problems.  Next scheduled appt. end of Jan. Also wonders if she should just take the higher dose every other day. Follow-up by: Starleen Blue RN,  November 25, 2007 2:51 PM  Additional Follow-up for Phone Call Additional follow up Details #1::        Pt may cut Hyzaar in half and take every day.  It is VERY important for her to check her blood pressures.  If they are higher than 155 systolic, she needs to call me as I will have to adjust meds.  Please make sure she continues to take her Norvasc and metoprolol as directed.  Thanks Additional Follow-up by: Johney Maine MD,  November 26, 2007 3:21 PM    Additional Follow-up for Phone Call Additional follow up Details #2::    PT. CONTACTED AND GIVEN INSTRUCTIONS.  UNDERSTOOD Follow-up by: Starleen Blue RN,  November 27, 2007 9:37 AM

## 2011-01-10 NOTE — Progress Notes (Signed)
Summary: Lab Res  Phone Note Call from Patient Call back at Home Phone [phone]   Caller: Patient Summary of Call: Calling concerning lab report from last Friday. Initial call taken by: Clydell Hakim,  January 09, 2010 4:31 PM  Follow-up for Phone Call        will forward to MD. Follow-up by: Theresia Lo RN,  January 09, 2010 4:37 PM  Additional Follow-up for Phone Call Additional follow up Details #1::        pt calling back about lab results wondering if she has a bladder inf? Additional Follow-up by: Clydell Hakim,  January 10, 2010 2:01 PM    Additional Follow-up for Phone Call Additional follow up Details #2::    no answer Follow-up by: Golden Circle RN,  January 10, 2010 2:27 PM  Additional Follow-up for Phone Call Additional follow up Details #3:: Details for Additional Follow-up Action Taken: called and advised patient that urine culture was negative , no growth. she is to follow up with MD in 2 weeks , advised her to call for appointment. Additional Follow-up by: Theresia Lo RN,  January 10, 2010 4:32 PM

## 2011-01-10 NOTE — Progress Notes (Signed)
  Phone Note Outgoing Call Call back at [facility] Phone [phone]   Call placed by: Johney Maine MD,  February 16, 2009 10:02 AM Details for Reason: appt reminder Summary of Call: called patient and reminded her of vascular appt tomorrow.   Initial call taken by: Johney Maine MD,  February 16, 2009 10:03 AM      Appended Document:  Pt has additional questions for MD.  Advised pt it might be monday before MD able to call.  # [id number] or cell [phone]

## 2011-01-10 NOTE — Assessment & Plan Note (Signed)
Summary: menopause/requested female dr/kh   Vital Signs:  Patient profile:   55 year old female Height:      66 inches Weight:      151 pounds BMI:     24.46 Temp:     98.1 degrees F oral Pulse rate:   81 / minute BP sitting:   192 / 104  (right arm) Cuff size:   regular  Vitals Entered By: Jimmy Footman, CMA (November 06, 2010 8:50 AM) CC: menopause issues, leg and back pain Is Patient Diabetic? Yes Did you bring your meter with you today? No Pain Assessment Patient in pain? yes     Location: back and leg Intensity: 8 Type: sharp   Primary Care Provider:  Edd Arbour  CC:  menopause issues and leg and back pain.  History of Present Illness: 55 yo here to see female provider for menopausal symptoms  Menopausal symptoms:  She is concerned that her symptoms are abdnormal at her age.  Has had period 10 times in the past year- still fairly regular.  Mother had menopause late and is still experiencing hot flashes in her 70's.  She experiences hot flashes and vaginal dryness but neither are symptomatic enough to warrant treatment and is does nto disrupt her quality of life.  HYPERTENSION Meds: Taking and tolerating? no- has been out for 1-2 weeks Home BP's: 130's/80's. Chest Pain: no Dyspnea: no  She gives me long list of medications she does not want to take.  She feels that HCTZ caused her dizziness and in turn caused her stroke.  She states she would like to take supplements instead as she feels they are safer.   Habits & Providers  Alcohol-Tobacco-Diet     Tobacco Status: never  Current Medications (verified): 1)  Aspirin 81 Mg Tbec (Aspirin) .... Take 1 Tablet By Mouth Once A Day 2)  Humulin N 100 Unit/ml Susp (Insulin Isophane Human) .... 38 Units Subcutaneously in Am and 15 Units in Pm. Increase The Nightime Dose By 1 Unit Each Night Until Your Am Blood Sugar Is Less Than 130. 3)  Metoprolol Tartrate 50 Mg Tabs (Metoprolol Tartrate) .Marland Kitchen.. 1 Tab By Mouth Two Times  A Day 4)  Norvasc 10 Mg Tabs (Amlodipine Besylate) .... Take 1 Tablet By Mouth Once A Day 5)  Bd Insulin Syringe Ultrafine 30g X 1/2" 0.5 Ml  Misc (Insulin Syringe-Needle U-100) .... Inject Subcutaneously Two Times A Day As  Directed 6)  Accu-Chek Compact   Strp (Glucose Blood) .... Use 4 Times A Day As Directed Disp: Qs X One Month 7)  Metformin Hcl 500 Mg Tabs (Metformin Hcl) .Marland Kitchen.. 1 Tablet By Mouth Every Morning With Food  Allergies: 1)  * Hyzaar 2)  Diovan PMH-FH-SH reviewed for relevance  Review of Systems      See HPI  Physical Exam  General:  Well-developed,well-nourished,in no acute distress; alert,appropriate and cooperative throughout examination.  Nervous appearing. Lungs:  Normal respiratory effort, chest expands symmetrically. Lungs are clear to auscultation, no crackles or wheezes. Heart:  Normal rate and regular rhythm. S1 and S2 normal without gallop, murmur, click, rub or other extra sounds.   Impression & Recommendations:  Problem # 1:  PERIMENOPAUSAL STATUS (ICD-V49.81)  She is not seeking treatment.  Advised may try herbal supplements of vasomotor symptoms and vaginal moisturizer and libricant for vaginial dryness.  Would not consider systemic estrogen in this patient with hx of CVA and poorly controlled HTN.  Orders: [facility]- Est Level  3 (56433)  Problem # 2:  HYPERTENSION, BENIGN SYSTEMIC (ICD-401.1)  porly controlled likely due to healthcare beliefs, suspicious of prescription medication.  Advised retsrating metoprolol and norvasc, and bringing BP log to visit in 1 week.  Her updated medication list for this problem includes:    Metoprolol Tartrate 50 Mg Tabs (Metoprolol tartrate) .Marland Kitchen... 1 tab by mouth two times a day    Norvasc 10 Mg Tabs (Amlodipine besylate) .Marland Kitchen... Take 1 tablet by mouth once a day  Orders: [facility]- Est Level  3 (11914)  Complete Medication List: 1)  Aspirin 81 Mg Tbec (Aspirin) .... Take 1 tablet by mouth once a day 2)  Humulin N 100  Unit/ml Susp (Insulin isophane human) .... 38 units subcutaneously in am and 15 units in pm. increase the nightime dose by 1 unit each night until your am blood sugar is less than 130. 3)  Metoprolol Tartrate 50 Mg Tabs (Metoprolol tartrate) .Marland Kitchen.. 1 tab by mouth two times a day 4)  Norvasc 10 Mg Tabs (Amlodipine besylate) .... Take 1 tablet by mouth once a day 5)  Bd Insulin Syringe Ultrafine 30g X 1/2" 0.5 Ml Misc (Insulin syringe-needle u-100) .... Inject subcutaneously two times a day as  directed 6)  Accu-chek Compact Strp (Glucose blood) .... Use 4 times a day as directed disp: qs x one month 7)  Metformin Hcl 500 Mg Tabs (Metformin hcl) .Marland Kitchen.. 1 tablet by mouth every morning with food  Patient Instructions: 1)  I am very concerned with your blood pressure!!!!!!  2)  Please restart your blood pressure medicines- they will help prevent stroke. 3)  I would not use herbal supplements to help control your high blood pressure or diabetes. 4)  You can try herbals supplements such as black cohosh to help with menopausal symptoms. 5)  FOllow-up in one week to recheck blood pressure, talk about diabetes, and get fasting labs (no food in 8 hours)   Orders Added: 1)  [facility]- Est Level  3 [78295]     Prevention & Chronic Care Immunizations   Influenza vaccine: Not documented   Influenza vaccine due: Refused  (12/14/2008)    Tetanus booster: 04/08/1993: Done.   Tetanus booster due: 04/09/2003    Pneumococcal vaccine: Not documented  Colorectal Screening   Hemoccult: Done.  (08/09/1997)   Hemoccult due: 08/09/1998    Colonoscopy: Not documented  Other Screening   Pap smear: NEGATIVE FOR INTRAEPITHELIAL LESIONS OR MALIGNANCY.  (04/11/2009)   Pap smear due: 03/09/2005    Mammogram: Done.  (12/09/2002)   Mammogram due: 12/10/2003   Smoking status: never  (11/06/2010)  Diabetes Mellitus   HgbA1C: 12.5  (04/26/2010)   Hemoglobin A1C due: 04/17/2008    Eye exam: Not documented    Diabetic eye exam action/deferral: Ophthalmology referral  (04/26/2010)    Foot exam: yes  (10/02/2007)   Foot exam action/deferral: Do today   High risk foot: Not documented   Foot care education: Not documented   Foot exam due: 10/01/2008    Urine microalbumin/creatinine ratio: Not documented  Lipids   Total Cholesterol: 192  (04/26/2010)   LDL: 139  (04/26/2010)   LDL Direct: Not documented   HDL: 42  (04/26/2010)   Triglycerides: 55  (04/26/2010)    SGOT (AST): 16  (04/26/2010)   SGPT (ALT): 16  (04/26/2010)   Alkaline phosphatase: 103  (04/26/2010)   Total bilirubin: 0.7  (04/26/2010)  Hypertension   Last Blood Pressure: 192 / 104  (11/06/2010)   Serum creatinine: 0.83  (  04/26/2010)   Serum potassium 3.7  (04/26/2010)    Hypertension flowsheet reviewed?: Yes   Progress toward BP goal: Unchanged  Self-Management Support :   Personal Goals (by the next clinic visit) :     Personal A1C goal: 8  (04/26/2010)     Personal blood pressure goal: 140/90  (04/26/2010)     Personal LDL goal: 100  (04/26/2010)    Patient will work on the following items until the next clinic visit to reach self-care goals:     Medications and monitoring: take my medicines every day, check my blood pressure, bring all of my medications to every visit  (11/06/2010)     Eating: drink diet soda or water instead of juice or soda, eat more vegetables, use fresh or frozen vegetables, eat foods that are low in salt, eat baked foods instead of fried foods, eat fruit for snacks and desserts  (04/26/2010)    Diabetes self-management support: Written self-care plan  (07/31/2010)    Hypertension self-management support: BP self-monitoring log, Education handout, Written self-care plan  (11/06/2010)   Hypertension self-care plan printed.   Hypertension education handout printed    Lipid self-management support: Written self-care plan  (04/26/2010)

## 2011-01-10 NOTE — Assessment & Plan Note (Signed)
Summary: mri results/dlh   Vital Signs:  Patient Profile:   55 Years Old Female Height:     66.25 inches Weight:      147.4 pounds BMI:     23.70 Temp:     97.5 degrees F oral Pulse rate:   84 / minute Pulse rhythm:   regular BP sitting:   154 / 94  (left arm)  Pt. in pain?   no  Vitals Entered By: Modesta Messing LPN (February 07, 1609 9:32 AM)                  PCP:  Johney Maine MD  Chief Complaint:  Hospital follow up visit for stroke.  History of Present Illness: 1)stroke: discusses MRI results w/ patient.  She said she wasn't completely aware that she had  many mini-strokes.  Had actually returned to ED same day she was sent home b/c she felt like heart was racing.  Was sent home.  Has regained full strength of right extremities. no trouble speaking.  is finally understanding importance of meds.  has MRA scheduled for tomorrow. Regrets not having it done w/ contrast in hospital but states the importance of contrast wasn't explained to her.  2)HTN: Pt doing well.  Taking prescribed medications as indicated.  Denies chest pain, shortness of breath,  edema.  Denies HA, vision changes. Does not want to start new medicicine today.  Understands that uncontrolled HTN played role in stroke.  Requests rx for bp cuff  3)DM: Pt doing well.  Taking medications as prescribed.  Denies increased urination, being thirsty, vision changes, signs of low sugars.   Wants to start lantus but not until after imaging and work-up done.    Reviewed medical history.  Updated and reviewed medications.       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 Lungs are clear to auscultation, no crackles or wheezes. Heart:     Normal rate and regular rhythm. S1 and S2 normal without gallop, murmur, click, rub or other extra sounds. Neurologic:     cranial nerves II-XII intact, strength normal in all extremities, and gait normal.   Psych:     normally interactive, good eye contact, and not anxious appearing.      Impression & Recommendations:  Problem # 1:  CVA (ICD-434.91) Assessment: Improved Appears to have no sequela. Spent over half of 30 min appt counseling patient on stroke and prevention and importance of medication adherance.  She will have MRA w/ contrast tomorrow to  get a better look at stenoses L ICA, which is likely the cause of her stroke.  Will refer to vascular surgeon if confirmed.  Needs 2D Echo ordered at next appt.  See below for prevention.  continue asa which patient was not taking regularly.  Needs better cholesterol control (LDL 137) however patient refuses statin due to side effects.  Her updated medication list for this problem includes:    Aspirin 81 Mg Tbec (Aspirin) .Marland Kitchen... Take 1 tablet by mouth once a day  Orders: [facility]- Est  Level 4 (56213) MRI with & without Contrast (MRI w&w/o Contrast) MRI (MRI)   Problem # 2:  HYPERTENSION, BENIGN SYSTEMIC (ICD-401.1) Assessment: Unchanged Spent much time discussing that patient's BP today still puts her at risk for stroke. Out of acute window of CVA so ok to lower BP however given stenosis is likely etiology, recomendatiosn are to lower after 7-10days.  Discussed option of addign on medication but  patient does not want to add on anything at today's appt. She will continue to think about it. I informed her that I strongly recommend new med.  However,  no point in adding med if patient not willing to take.  I hope to work with her on this and will discuss at next appt.  Could increase metoprolol but this does not add much bp control.  Would not add on clonodine pill due to adherence problem but could consider a patch.  Hydralzine would be a poor choice given patient's difficulty in taking meds. Cardura and Janace Aris are also options Her updated medication list for this problem includes:    Metoprolol Succinate 25 Mg Tb24 (Metoprolol succinate) .Marland Kitchen... Take 1 tablet once a day    Norvasc 10 Mg Tabs (Amlodipine besylate) .Marland Kitchen... Take 1 tablet by mouth once a day    Diovan Hct 160-12.5 Mg Tabs (Valsartan-hydrochlorothiazide) .Marland Kitchen... 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  patient finally agreeable to Lantus as she has read on it.  Encouraged this change for safety as well as adherance issues.  Will discuss and start at next visit.   Her updated medication list for this problem includes:    Aspirin 81 Mg Tbec (Aspirin) .Marland Kitchen... Take 1 tablet by mouth once a day    Humulin N 100 Unit/ml Susp (Insulin isophane human) .Marland KitchenMarland KitchenMarland KitchenMarland Kitchen 38 units subcutaneously in am and 20 units in pm.    Diovan Hct 160-12.5 Mg Tabs (Valsartan-hydrochlorothiazide) .Marland Kitchen... 1 tab by mouth daily  Labs Reviewed: HgBA1c: 10.7 (02/03/2009)   Creat: 0.77 (12/14/2008)     Orders: [facility]- Est  Level 4 (84696)   Problem # 4:  HYPERLIPIDEMIA (ICD-272.4) Assessment: Unchanged   Needs better cholesterol control (LDL 137) however patient  refuses statin due to side effects.  Complete Medication List: 1)  Aspirin 81 Mg Tbec (Aspirin) .... Take 1 tablet by mouth once a day 2)  Humulin N 100 Unit/ml Susp (Insulin isophane human) .... 38 units subcutaneously in am and 20 units in pm. 3)  Metoprolol Succinate 25 Mg Tb24 (Metoprolol succinate) .... Take 1 tablet once a day 4)  Norvasc 10 Mg Tabs (Amlodipine besylate) .... Take 1 tablet by mouth once a day 5)  Bd Insulin Syringe Ultrafine 30g X 1/2" 0.5 Ml Misc (Insulin syringe-needle u-100) .... Inject subcutaneously two times a day as  directed 6)  Diovan Hct 160-12.5 Mg Tabs (Valsartan-hydrochlorothiazide) .Marland Kitchen.. 1 tab by mouth daily 7)  Accu-chek Compact Strp (Glucose blood) .... Use 4 times a day as directed disp: qs x one month   Patient Instructions: 1)  f/u next week (OK to double book) 2)  IT IS SUPER IMPORTANT TO TAKE ALL YOUR BLOOD PRESSURE AND DIABETES MEDICINE TO PREVENT STROKES AND  HEART ATTACK 3)  GO TO YOUR MRI APPOINTMENT 4)  THINK ABOUT THE IMPORTANCE OF ADDING ON A BLOOD PRESSURE MEDICINE AND STARTING LANTUS 5)  TRY AND AVOID FATTY FOODS AND SWEET FOODS  Appended Document: mri results/dlh Of note BP elevated to 170s systolic 90s diastolic in hospital on all meds

## 2011-01-10 NOTE — Letter (Signed)
Summary: [name]  [address]   [place], [place] [address]   Phone: [phone]  Fax: [phone]    06/02/2009 MRN: [id number]  Dear [name],   You have missed three scheduled appointments with the [facility] since the beginning of the year.  It is expected that if you are unable to keep your appointments that you will call to cancel at least 24 hours prior to your appointment time.  In order for [place] to provide you with the best possible care, we urge you to keep your appointments.  If you continue to miss appointments, however, we will ask that you find another healthcare provider.  Our business office staff can be reached at [phone] Monday through Friday from 8:30 a.m.-5:00 p.m. and will be glad to reschedule your appointment as necessary.  Thank you for your cooperation.  Sincerely,     [name] MSN RN Clinic Manager [name]

## 2011-01-10 NOTE — Progress Notes (Signed)
Summary: phn msg  Phone Note Call from Patient Call back at Home Phone [phone] Call back at [phone]   Caller: Patient Summary of Call: pt was checking on test she had today would like to have cream instead of pills if something is called in for her yeast infection. Initial call taken by: Clydell Hakim,  Apr 11, 2009 11:35 AM  Follow-up for Phone Call        pt stataes she picked her rx and this is fine with her. Follow-up by: Alphia Kava,  Apr 11, 2009 1:57 PM

## 2011-01-10 NOTE — Assessment & Plan Note (Signed)
Summary: UTI?,df   Vital Signs:  Patient profile:   55 year old female Height:      66 inches Weight:      149 pounds BMI:     24.14 Temp:     98.5 degrees F oral Pulse rate:   79 / minute BP sitting:   170 / 90  (right arm) Cuff size:   regular  Vitals Entered By: Tessie Fass CMA (November 15, 2010 9:49 AM) CC: dysuria, itching and burning.   Primary Care Provider:  Edd Arbour  CC:  dysuria and itching and burning.Marland Kitchen  History of Present Illness: 55 y/o F here for dysuria  DYSURIA Onset:  3 wks ago  Description: Itching and burning sensation.  Suprapubic pressure.  Urine darker than normal.   Modifying factors: Pt purchased Azo Test Strip 2 days ago and it was positive .    Symptoms Urgency:  yes Frequency: yes  Hesitancy: yes  Hematuria: ?, urine darker  Flank Pain:  pt has back pain chronically from herniated disk Fever: ?, has hot flashes so pt cannot tell  Nausea/Vomiting:  +nausea, no vomiting  Missed LMP: perimenopausal, finished menses 2 days ago STD exposure: no Discharge: no Irritants: no Rash: no  Red Flags   More than 3 UTI's last 12 months:  no  PMH of Diabetes or Immunosuppression:  +DM Renal Disease/Calculi:  Urinary Tract Abnormality:  no Instrumentation or Trauma: no    Allergies: 1)  * Hyzaar 2)  Diovan  Review of Systems       per hpi    Impression & Recommendations:  Problem # 1:  DYSURIA (ICD-788.1) Assessment New Pt with symptoms but UA negative.  Will not treat.    Orders: Urinalysis-[facility] (00000) [facility]- Est Level  3 (60454)  Problem # 2:  HYPERTENSION, BENIGN SYSTEMIC (ICD-401.1) BP elevated today.  Pt has appt next week to see pcp regarding HTN.    Her updated medication list for this problem includes:    Metoprolol Tartrate 50 Mg Tabs (Metoprolol tartrate) .Marland Kitchen... 1 tab by mouth two times a day    Norvasc 10 Mg Tabs (Amlodipine besylate) .Marland Kitchen... Take 1 tablet by mouth once a day  Complete Medication List: 1)   Aspirin 81 Mg Tbec (Aspirin) .... Take 1 tablet by mouth once a day 2)  Humulin N 100 Unit/ml Susp (Insulin isophane human) .... 38 units subcutaneously in am and 15 units in pm. increase the nightime dose by 1 unit each night until your am blood sugar is less than 130. 3)  Metoprolol Tartrate 50 Mg Tabs (Metoprolol tartrate) .Marland Kitchen.. 1 tab by mouth two times a day 4)  Norvasc 10 Mg Tabs (Amlodipine besylate) .... Take 1 tablet by mouth once a day 5)  Bd Insulin Syringe Ultrafine 30g X 1/2" 0.5 Ml Misc (Insulin syringe-needle u-100) .... Inject subcutaneously two times a day as  directed 6)  Accu-chek Compact Strp (Glucose blood) .... Use 4 times a day as directed disp: qs x one month 7)  Metformin Hcl 500 Mg Tabs (Metformin hcl) .Marland Kitchen.. 1 tablet by mouth every morning with food   Orders Added: 1)  Urinalysis-[facility] [00000] 2)  [facility]- Est Level  3 [99213]    Laboratory Results   Urine Tests  Date/Time Received: November 15, 2010 9:57 AM  Date/Time Reported: November 15, 2010 10:12 AM   Routine Urinalysis   Color: yellow Appearance: Clear Glucose: 500   (Normal Range: Negative) Bilirubin: negative   (  Normal Range: Negative) Ketone: negative   (Normal Range: Negative) Spec. Gravity: 1.020   (Normal Range: 1.003-1.035) Blood: negative   (Normal Range: Negative) pH: 6.5   (Normal Range: 5.0-8.0) Protein: negative   (Normal Range: Negative) Urobilinogen: 0.2   (Normal Range: 0-1) Nitrite: negative   (Normal Range: Negative) Leukocyte Esterace: negative   (Normal Range: Negative)    Comments: .........Marland Kitchenbiochemical negative; microscopic not indicated ...............test performed by......Marland KitchenBonnie A. [place], MLS (ASCP)cm

## 2011-01-10 NOTE — Miscellaneous (Signed)
Summary: dnka/ts  Clinical Lists Changes 

## 2011-01-10 NOTE — Miscellaneous (Signed)
Summary: pt summary  Clinical Lists Changes patient does not adhere to medication regimine.  has fear of physicians and many treatments.  continues to miss appointments.  Problems: Assessed CAROTID ARTERY STENOSIS, LEFT as unchanged - followed by vascular but not significant enough for surgery Her updated medication list for this problem includes:    Aspirin 81 Mg Tbec (Aspirin) .Marland Kitchen... Take 1 tablet by mouth once a day  Assessed CVA as comment only - CONTINUES TO BE HESITANT TO CHANGE MEDICATIONS ADN FOLLOW TREATMENT PLAN DESPITE CVA.   Have discussed the severity of her situation.  Need to discuss increasing her ASA to 325mg  at upcoming visit.  On a statin.  [facility]ed [facility].  carotid dopplers and stress test wnl Her updated medication list for this problem includes:    Aspirin 81 Mg Tbec (Aspirin) .Marland Kitchen... Take 1 tablet by mouth once a day  Assessed HYPERTENSION, BENIGN SYSTEMIC as unchanged - improved but not at goal.  I think that non adherance is the main issue although she did have elevated BP while in  hospital on all meds.  She does not like starting new meds and says most give her side effects.  Would add on hydralazine if continud elevation.  or switch norvasc to diltiazem and titrate upwards.  or consider cardura.   Her updated medication list for this problem includes:    Metoprolol Succinate 25 Mg Tb24 (Metoprolol succinate) .Marland Kitchen... Take 1 tablet once a day    Norvasc 10 Mg Tabs (Amlodipine besylate) .Marland Kitchen... Take 1 tablet by mouth once a day    Diovan Hct 160-12.5 Mg Tabs (Valsartan-hydrochlorothiazide) .Marland Kitchen... 1 tab by mouth daily  Prior BP: 157/95 (04/11/2009)  Labs Reviewed: K+: 4.1 (04/11/2009) Creat: : 0.84 (04/11/2009)   Chol: 165 (04/11/2009)   HDL: 36 (04/11/2009)   LDL: 118 (04/11/2009)   TG: 56 (04/11/2009)  Assessed HYPERLIPIDEMIA as unchanged -  Her updated medication list for this problem includes:    Simvastatin 20 Mg Tabs (Simvastatin) .Marland Kitchen... 1 tab by mouth at  bedtime  Labs Reviewed: SGOT: 10 (04/11/2009)   SGPT: 11 (04/11/2009)   HDL:36 (04/11/2009), 42 (09/16/2008)  LDL:118 (04/11/2009), 118 (09/16/2008)  Chol:165 (04/11/2009), 173 (09/16/2008)  Trig:56 (04/11/2009), 65 (09/16/2008)  Assessed DIABETES MELLITUS, II, COMPLICATIONS as comment only - Needed A1C end of May or early June but MISSED Appt.  I have advised patient that Lantus is preferred insulin as treating w/ short acting insulin is dangerous and not as effective.  patient continues to refuse this change but again says she will switch at next visit.  Encouraged good eating habits.  Informed her that 150s are much better and that 130-150 is a good goal Her updated medication list for this problem includes:    Aspirin 81 Mg Tbec (Aspirin) .Marland Kitchen... Take 1 tablet by mouth once a day    Humulin N 100 Unit/ml Susp (Insulin isophane human) .Marland KitchenMarland KitchenMarland KitchenMarland Kitchen 38 units subcutaneously in am and 20 units in pm.    Diovan Hct 160-12.5 Mg Tabs (Valsartan-hydrochlorothiazide) .Marland Kitchen... 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 Take 1 tablet by mouth once a day  Problem # 2:  CVA (ICD-434.91) CONTINUES TO BE HESITANT TO CHANGE  MEDICATIONS ADN FOLLOW TREATMENT PLAN DESPITE CVA.   Have discussed the severity of her situation.  Need to discuss increasing her ASA to 325mg  at upcoming visit.  On a statin.  [facility]ed [facility].  carotid dopplers and stress test wnl Her updated medication list for this problem includes:    Aspirin 81 Mg Tbec (Aspirin) .Marland Kitchen... Take 1 tablet by mouth once a day  Problem # 3:  HYPERTENSION, BENIGN SYSTEMIC (ICD-401.1) Assessment: Unchanged improved but not at goal.  I think that non adherance is the main  issue although she did have elevated BP while in  hospital on all meds.  She does not like starting new meds and says most give her side effects.  Would add on hydralazine if continud elevation.  or switch norvasc to diltiazem and titrate upwards.  or consider cardura.   Her updated medication list for this problem includes:    Metoprolol Succinate 25 Mg Tb24 (Metoprolol succinate) .Marland Kitchen... Take 1 tablet once a day    Norvasc 10 Mg Tabs (Amlodipine besylate) .Marland Kitchen... Take 1 tablet by mouth once a day    Diovan Hct 160-12.5 Mg Tabs (Valsartan-hydrochlorothiazide) .Marland Kitchen... 1 tab by mouth daily  Prior BP: 157/95 (04/11/2009)  Labs Reviewed: K+: 4.1 (04/11/2009) Creat: : 0.84 (04/11/2009)   Chol: 165 (04/11/2009)   HDL: 36 (04/11/2009)   LDL: 118 (04/11/2009)   TG: 56 (04/11/2009)  Problem # 4:  HYPERLIPIDEMIA (ICD-272.4) Assessment: Unchanged  Her updated medication list for this problem includes:    Simvastatin 20 Mg Tabs (Simvastatin) .Marland Kitchen... 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  I have advised patient that Lantus is preferred insulin as treating w/ short acting insulin is dangerous and not as effective.  patient continues to refuse this change but again says she will switch at next visit.  Encouraged good eating habits.  Informed her that 150s are much better and that 130-150 is a good goal Her updated medication list for this problem includes:    Aspirin 81 Mg Tbec (Aspirin) .Marland Kitchen... Take 1 tablet by mouth once a day    Humulin N 100 Unit/ml Susp (Insulin isophane human) .Marland KitchenMarland KitchenMarland KitchenMarland Kitchen 38 units subcutaneously in am and 20 units in pm.    Diovan Hct 160-12.5 Mg Tabs  (Valsartan-hydrochlorothiazide) .Marland Kitchen... 1 tab by mouth daily  Orders: [facility]- Est  Level 4 (16109)  Complete Medication List: 1)  Aspirin 81 Mg Tbec (Aspirin) .... Take 1 tablet by mouth once a day 2)  Humulin N 100 Unit/ml Susp (Insulin isophane human) .... 38 units subcutaneously in am and 20 units in pm. 3)  Metoprolol Succinate 25 Mg Tb24 (Metoprolol succinate) .... Take 1 tablet once a day 4)  Norvasc 10 Mg Tabs (Amlodipine besylate) .... Take 1 tablet by mouth once a day 5)  Bd Insulin Syringe Ultrafine 30g X 1/2" 0.5 Ml Misc (Insulin syringe-needle u-100) .... Inject subcutaneously two times a day as  directed 6)  Diovan Hct 160-12.5 Mg Tabs (Valsartan-hydrochlorothiazide) .Marland Kitchen.. 1 tab by mouth daily 7)  Accu-chek Compact Strp (Glucose blood) .... Use 4 times a day as directed disp: qs x one month 8)  Simvastatin 20 Mg Tabs (Simvastatin) .Marland Kitchen.. 1 tab by mouth  at bedtime

## 2011-01-10 NOTE — Assessment & Plan Note (Signed)
Summary: F/U Rx clinic DM insulin change.    Vital Signs:  Patient Profile:   55 Years Old Female Height:     66.25 inches Weight:      151 pounds BMI:     24.28 Pulse rate:   82 / minute BP sitting:   157 / 90  (right arm)                 PCP:  JESSICA TRICHE MD  Chief Complaint:  Pain and Cramps .  History of Present Illness: DM regimen  NPH 35 QAM  NPH 17 QPM  Pain and cramps managed with ibuprofen 200mg  as needed.  Works OK per patient.  Diovan HCT has caused a slight cough.   Diabetes Management History:      She has not been enrolled in the "Diabetic Education Program".  She states that she is not following the diet appropriately.  She is checking home blood sugars.  She says that she is not exercising regularly.        Reported hypoglycemic symptoms include confusion.  Frequency of hypoglycemic symptoms are reported to be fairly frequently.  Other comments include: shakes, disorientation.        Her home fasting blood sugars are as follows: highest: 200; lowest: 135; average: 170.  11:00 AM blood sugars include: average: 180.  Her 4:00 PM blood sugars reveal: average: 200.  9:00 PM blood sugars are: average: 165.       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  Remains uninterested in changing insulin regime today.  Her cramps make it difficult to think about any thing new today.  Following education on glycemic goals and frequency of hypoglycemia patient is willing to consider new DM regiment with long acting insulin and bolus coverage at meals.  She will reschedule visit with Rx clinic to attempt Lantus and apid acting insulin in 2 weeks.  Needs significan  support to make change in insulin regimen. Her updated medication list for this problem includes:    Aspirin 81 Mg Tbec (Aspirin) .Marland Kitchen... Take 1 tablet by mouth once a day    Humulin N 100 Unit/ml Susp (Insulin isophane human) .Marland KitchenMarland KitchenMarland KitchenMarland Kitchen 35 units subcutaneously in am and 17 units in pm.    Diovan Hct 80-12.5 Mg Tabs (Valsartan-hydrochlorothiazide) .Marland Kitchen... 1 daily  Orders: Reassessment Each 15 min unit- [facility] ([address]75)   Problem # 2:  HYPERTENSION, BENIGN SYSTEMIC (ICD-401.1) Assessment: Improved imporved BP numbers in patient currenty taking 4 BP meds. Reports increase in cough with Diovan HCT.  Patient reports less dizziness with this compared to Hyzaar.  We  can consider alternative ARB/HCTZ combination at next visit.  Patient reluctant to make change as she has recenlty purchased another month supply.  cough is uncommon with ARB/HCTZ but possibily of switching ot an alternaive option is reasonable.  Her updated medication list for this problem includes:    Metoprolol Succinate 25 Mg Tb24 (Metoprolol succinate) .Marland Kitchen... Take 1 tablet once a day    Norvasc 10 Mg Tabs (Amlodipine besylate) .Marland Kitchen... Take 1 tablet by mouth once a day    Diovan Hct 80-12.5 Mg Tabs (Valsartan-hydrochlorothiazide) .Marland Kitchen... 1 daily  [id number]   Complete Medication List: 1)  Aspirin 81 Mg Tbec (Aspirin) .... Take 1 tablet by mouth once a day 2)  Humulin N 100 Unit/ml Susp (Insulin isophane human) .... 35 units subcutaneously in am and 17 units in pm. 3)  Metoprolol Succinate 25 Mg Tb24 (Metoprolol succinate) .... Take 1 tablet once a day 4)  Norvasc 10 Mg Tabs (Amlodipine besylate) .... Take 1 tablet by mouth once a day 5)  Bd Insulin Syringe Ultrafine 30g X 1/2" 0.5 Ml Misc (Insulin syringe-needle u-100) .... Inject subcutaneously two times a day as  directed 6)  Diovan Hct 80-12.5 Mg Tabs (Valsartan-hydrochlorothiazide) .Marland Kitchen.. 1 daily  Diabetes Management Assessment/Plan:      The following  lipid goals have been established for the patient: Total cholesterol goal of 200; LDL cholesterol goal of 100; HDL cholesterol goal of 40; Triglyceride goal of 200.     Patient Instructions: 1)  Please schedule a follow-up appointment in 2-3 weeks Rx Clinic. 2)  Bring Medication into visit. 3)  Bring blood glucose meter.   Prescriptions: DIOVAN HCT 80-12.5 MG  TABS (VALSARTAN-HYDROCHLOROTHIAZIDE) 1 daily  #30 x 0   Entered and Authorized by:   Madelon Lips PHARMD   Signed by:   Madelon Lips PHARMD on 05/05/2008   Method used:   Historical   RxID:   [id number]  ]

## 2011-01-10 NOTE — Progress Notes (Signed)
Summary: Test Res  Phone Note Call from Patient Call back at Home Phone [phone] Call back at [phone]   Caller: Patient Summary of Call: Checking on her tests from yesterday. Initial call taken by: Clydell Hakim,  August 01, 2010 12:09 PM  Follow-up for Phone Call        the lab never received a urine sample.  Please let patient know if she is continuing to have problems to come in for appt. Follow-up by: Delbert Harness MD,  August 01, 2010 1:57 PM  Additional Follow-up for Phone Call Additional follow up Details #1::        message left to return call. Additional Follow-up by: Theresia Lo RN,  August 01, 2010 2:06 PM    Additional Follow-up for Phone Call Additional follow up Details #2::    pt returned call and is home now Follow-up by: De Nurse,  August 01, 2010 2:08 PM  spoke with patient . she did obtain urine specimen yesterday and placed it in the window to lab but the lab never received an order so at end of day the urine was discarded.  there is no order for urine noted. will ask MD if she will put in urinalysis order . patient will return tomorrow AM for urinaylsis. states she had discussed with MD at appointment yesterday and she does not feel she needs to come back in for another.appointment . states she just wanted to check to see if she has a UTI because she has a burning sensation and voiding small amounts of urine .  Theresia Lo RN  August 01, 2010 4:45 PM  order entered. Delbert Harness MD  August 01, 2010 11:52 PM

## 2011-01-10 NOTE — Assessment & Plan Note (Signed)
Summary: htn meds & higher cbgs   Vital Signs:  Patient Profile:   55 Years Old Female Height:     66.25 inches Weight:      149.4 pounds Temp:     98.2 degrees F Pulse rate:   88 / minute BP sitting:   132 / 86  (left arm)  Pt. in pain?   no  Vitals Entered By: Jacki Cones RN (December 14, 2008 10:16 AM)                 Flu Vaccine Next Due:  Refused   PCP:  Johney Maine MD  Chief Complaint:  f/u bp, dm, and and has dysuria.  History of Present Illness: 55 yo F here as work-in for elevated blood sugars  1. Blood sugars/DM - patient did not bring her meter with her today.  She states AM blood sugar has been up to 414 and it has never been in the 400s before.  She attributes this to increased dosage of diovan and as such has stopped taking the medicine the past 2 days.  I reviewed side effects of medication with her and showed her that hyperglycemia is not a side effect and given her a1c's, her average blood sugar over the past year is around the 300s so there are probably times where it has been higher than 400 but she has just not checked it at those times.  Regardless, she is taking humulin N 38 qam and 20 qpm.  Tolerating injections well.  2. HTN - BP elevated today and was 148/94 on repeat measurement.  No cp, dyspnea, other complaints.  Complained of being more dehydrated with diovan hct and dizzy.  I discussed diabetes affecting increased urination when cbgs > 250 and can lead to dehydration.  States home BPs were in 100s systolic when on diovan hct, metoprolol, and norvasc though she has stopped diovan hct.  3. Dysuria - she complained of increased urination and mild burning with urination so urinalysis was sent.    Updated Prior Medication List: ASPIRIN 81 MG TBEC (ASPIRIN) Take 1 tablet by mouth once a day HUMULIN N 100 UNIT/ML SUSP (INSULIN ISOPHANE HUMAN) 38 units subcutaneously in am and 20 units in pm. METOPROLOL SUCCINATE 25 MG TB24 (METOPROLOL SUCCINATE) Take 1  tablet once a day NORVASC 10 MG TABS (AMLODIPINE BESYLATE) Take 1 tablet by mouth once a day BD INSULIN SYRINGE ULTRAFINE 30G X 1/2" 0.5 ML  MISC (INSULIN SYRINGE-NEEDLE U-100) inject subcutaneously two times a day as  directed DIOVAN HCT 160-25 MG TABS (VALSARTAN-HYDROCHLOROTHIAZIDE) 1 tab by mouth daily ACCU-CHEK COMPACT   STRP (GLUCOSE BLOOD) Use 4 times a day as directed Disp: QS x one month  Current Allergies (reviewed today): * HYZAAR  Past Medical History:    Reviewed history from 09/16/2008 and no changes required:        Recurrant BV and vaginitis       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  Repeat bp 148/94 HEENT: NCAT, PERRL, EOMI, no conjunctival inflammation, pharynx nl without erythema or exudates, MMM. External ear exam nl - TMs  without bulging or erythema.  No nasal d/c Neck: No LAN, thyromegaly, masses, bruits CV: RRR, no MRG Lungs: Nl resp effort. CTAB no wheezes, rales, rhonchi Ext: warm, well perfused.  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  She stated she felt comfortable with increasing insulin on her own and reported to me that she understood the patient instructions I gave her to increase nighttime dose by 2 units each night until AM blood sugar is less than 130 then maintain that evening dose of humulin.  Stressed importance of maintaining regular meal schedules including evening snack - she needs to either have a small snack before bedtime every night or choose not to do this, otherwise she runs the  risk of hypoglycemia or hyperglycemia through the night without a routine.  Expressed understanding.  Told her to call me if there is any confusion.  Her updated medication list for this problem includes:    Aspirin 81 Mg Tbec (Aspirin) .Marland Kitchen... Take 1 tablet by mouth once a day    Humulin N 100 Unit/ml Susp (Insulin isophane human) .Marland KitchenMarland KitchenMarland KitchenMarland Kitchen 38 units subcutaneously in am and 20 units in pm.    Diovan Hct 160-12.5 Mg Tabs (Valsartan-hydrochlorothiazide) .Marland Kitchen... 1 tab by mouth daily  Orders: A1C-[facility] ([address]) [facility]- Est  Level 4 (60454)   Problem # 2:  HYPERTENSION, BENIGN SYSTEMIC (ICD-401.1) Assessment: Deteriorated BP sounds like it was very well controlled with metoprolol, norvasc, and diovan.  Will decrease dosage of hctz component of diovan given her report of dizziness to see if this helps with that symptom.  As noted in HPI, showed her hyperglycemia is not a side effect of diovan hct and that her average blood sugar by a1c portends her control is poor and has nothing to do with this medication (average blood sugar by a1c will be > 300).  Diovan hct 150/12.5 sent to her pharmacy.  Her updated medication list for this problem includes:    Metoprolol Succinate 25 Mg Tb24 (Metoprolol succinate) .Marland Kitchen... Take 1 tablet once a day    Norvasc 10 Mg Tabs (Amlodipine besylate) .Marland Kitchen... Take 1 tablet by mouth once a day    Diovan Hct 160-12.5 Mg Tabs (Valsartan-hydrochlorothiazide) .Marland Kitchen... 1 tab by mouth daily  Orders: Basic Met-[facility] ([address]11-91478) [facility]- Est  Level 4 (29562)   Problem # 3:  DYSURIA (ICD-788.1) Assessment: New No evidence of infection.  Discussed increased urination and setup for infection coming from her glycosuria - need to get sugars less than 250 to make an impact on this.  Orders: Urinalysis-[facility] (00000) [facility]- Est  Level 4 (13086)  The following medications were removed from the medication list:    Metronidazole 500 Mg Tabs (Metronidazole) .Marland Kitchen... 1 tab by mouth two times a day for 7  days   Complete Medication List: 1)  Aspirin 81 Mg Tbec (Aspirin) .... Take 1 tablet by mouth once a day 2)  Humulin N 100 Unit/ml Susp (Insulin isophane human) .... 38 units subcutaneously in am and 20 units in pm. 3)  Metoprolol Succinate 25 Mg Tb24 (Metoprolol succinate) .... Take 1 tablet once a day 4)  Norvasc 10 Mg Tabs (Amlodipine besylate) .... Take 1 tablet by mouth once a day 5)  Bd Insulin Syringe Ultrafine 30g X 1/2" 0.5 Ml Misc (Insulin syringe-needle u-100) .... Inject subcutaneously two times a  day as  directed 6)  Diovan Hct 160-12.5 Mg Tabs (Valsartan-hydrochlorothiazide) .Marland Kitchen.. 1 tab by mouth daily 7)  Accu-chek Compact Strp (Glucose blood) .... Use 4 times a day as directed disp: qs x one month   Patient Instructions: 1)  I want you to focus on your morning blood sugars for now.  Increase your humulin N by 2 units every evening until your morning blood sugar is less than 130 (for example you are taking 20 units now - increase to 22 tonight.  If tomorrow morning blood sugar is more than 130, increase to 24 tomorrow night and keep doing this until the next morning's sugar is less than 130).  Once you get to a dose that gets your blood sugar less than 130, continue that dose.  You may need a significant increase given how high your a1c is. 2)  Bring your meter with you to all your appointments. 3)  I have sent the new dose of diovan/hct to your pharmacy. 4)  Follow up with Dr. Karn Pickler at your scheduled appointment.   Prescriptions: DIOVAN HCT 160-12.5 MG TABS (VALSARTAN-HYDROCHLOROTHIAZIDE) 1 tab by mouth daily  #30 x 5   Entered and Authorized by:   Norton Blizzard MD   Signed by:   Norton Blizzard MD on 12/14/2008   Method used:   [facility]        [facility] [address] [phone]* (retail)       [address]       [place], [place]  [address]       Ph: [phone] or [phone]       Fax: [phone]   RxID:    [id number]  ] Laboratory Results   Urine Tests  Date/Time Received: December 14, 2008 10:18 AM  Date/Time Reported: December 14, 2008 10:23 AM   Routine Urinalysis   Color: yellow Appearance: Clear Glucose: 500   (Normal Range: Negative) Bilirubin: negative   (Normal Range: Negative) Ketone: trace (5)   (Normal Range: Negative) Spec. Gravity: 1.025   (Normal Range: 1.003-1.035) Blood: negative   (Normal Range: Negative) pH: 6.0   (Normal Range: 5.0-8.0) Protein: trace   (Normal Range: Negative) Urobilinogen: 0.2   (Normal Range: 0-1) Nitrite: negative   (Normal Range: Negative) Leukocyte Esterace: negative   (Normal Range: Negative)    Comments: ...............test performed by......Marland KitchenBonnie A. [place], [place] (ASCP)   Blood Tests   Date/Time Received: December 14, 2008 10:07 AM  Date/Time Reported: December 14, 2008 10:14 AM   HGBA1C: 11.3%   (Normal Range: Non-Diabetic - 3-6%   Control Diabetic - 6-8%)  Comments: AMY MARTIN RN  December 14, 2008 10:14 AM

## 2011-01-10 NOTE — Progress Notes (Signed)
Summary: question  Phone Note Call from Patient Call back at Home Phone [phone]   Caller: Patient Summary of Call: pt is wondering where her forms to be faxed are and if there is any questions pls call Initial call taken by: De Nurse,  October 26, 2009 10:46 AM  Follow-up for Phone Call        I will call patient tomorrow evening after my day shift to address her concern.  Follow-up by: Magnus Ivan MD,  November 07, 2009 11:20 PM  Additional Follow-up for Phone Call Additional follow up Details #1::         Patient needed me to fill out papers for medication to be refilled and faxed through a mail order company.  Someone must have filled out papers and faxed information.  Patient told me that she should be getting medicine soon.  I told her to follow up with me if medication does not come.  Will also be seeing her in clinic next week. Additional Follow-up by: Magnus Ivan MD,  November 08, 2009 7:00 PM

## 2011-01-10 NOTE — Assessment & Plan Note (Signed)
Summary: BP follow up/ls   Vital Signs:  Patient profile:   55 year old female Height:      66 inches Weight:      147 pounds BMI:     23.81 Temp:     98.1 degrees F oral Pulse rate:   82 / minute BP sitting:   181 / 91  (right arm) Cuff size:   regular  Vitals Entered By: Tessie Fass CMA (January 04, 2010 4:14 PM) CC: f/u bp/ear/dysuria Is Patient Diabetic? Yes Pain Assessment Patient in pain? no        Primary Care Provider:  Magnus Ivan MD  CC:  f/u bp/ear/dysuria.  History of Present Illness: ear:  patient is having  throbbing/ringing in her R ear.  L ear without problems.  came on about a month ago when she started experiencing a cold.  nothing makes symptoms better or worse.  no pain radiation.  patient still with lingering cough.  no headache  htn: chest pain-no headaches-no vision changes-with blood sugar sob-sometimes with coughing from cold  swelling-no compliant with medication-yes do bp readings at home?yes values: 137 (really upset), 117/80  dysuria: pt experiencing dysuria, increased need to void but only a small amount coming out.  possible blood in urine but just ending period.   Habits & Providers  Alcohol-Tobacco-Diet     Tobacco Status: never  Allergies (verified): 1)  * Hyzaar 2)  Diovan  Review of Systems       as per hpi  Physical Exam  General:  alert and in some distress, especially about ear and needing to leave. vital signs wnl except for extremely elevated blood pressure. Ears:  L ear without erythema, landmarks visualized. R ear without erythema.  landmarks not visualized as well.  tympanometer determined that both ears were wnl. Heart:  II/VI systolic murmur Pulses:  2+ radial pulses Extremities:  trace left pedal edema and trace right pedal edema.   Psych:  memory intact for recent and remote and slightly anxious.     Impression & Recommendations:  Problem # 1:  HYPERTENSION, BENIGN SYSTEMIC  (ICD-401.1)  Very concerned for patient's htn.  Retake was 170/90.  Decided to add HCTZ to regimen and have patient reassessed in 2 weeks.  Her updated medication list for this problem includes:    Metoprolol Tartrate 50 Mg Tabs (Metoprolol tartrate) ..... Increased as per her cardiologist    Norvasc 10 Mg Tabs (Amlodipine besylate) .Marland Kitchen... Take 1 tablet by mouth once a day    Hydrochlorothiazide 25 Mg Tabs (Hydrochlorothiazide) .Marland Kitchen... Take 1 tablet once a day  Orders: [facility]- Est  Level 4 ([address])  Problem # 2:  DYSURIA (ICD-788.1) Assessment: Deteriorated  Patient concerned about a bladder infection.  Will write future orders to have urine examined.  Orders: [facility]- Est  Level 4 (99214)Future Orders: Urinalysis-[facility] (00000) ... 01/04/2011 Urine Culture-[facility] ([id number]) ... 01/04/2011  Problem # 3:  EUSTACHIAN TUBE DYSFUNCTION, RIGHT (ICD-381.81) Assessment: New  Pt having this issue which might be connected to recent cold.  Told patient to take OTC Sudafed to help drain any fluid in ears and hopefully she will feel better.  Will follow up on symptoms at next appointment.  Orders: [facility]- Est  Level 4 (91478)  Complete Medication List: 1)  Aspirin 81 Mg Tbec (Aspirin) .... Take 1 tablet by mouth once a day 2)  Humulin N 100 Unit/ml Susp (Insulin isophane human) .... 38 units subcutaneously in am and 15 units in pm. 3)  Metoprolol Tartrate 50 Mg Tabs (Metoprolol tartrate) .... Increased as per her cardiologist 4)  Norvasc 10 Mg Tabs (Amlodipine besylate) .... Take 1 tablet by mouth once a day 5)  Bd Insulin Syringe Ultrafine 30g X 1/2" 0.5 Ml Misc (Insulin syringe-needle u-100) .... Inject subcutaneously two times a day as  directed 6)  Accu-chek Compact Strp (Glucose blood) .... Use 4 times a day as directed disp: qs x one month 7)  Simvastatin 20 Mg Tabs (Simvastatin) .Marland Kitchen.. 1 tab by mouth at bedtime 8)  Hydrochlorothiazide 25 Mg Tabs (Hydrochlorothiazide) .... Take 1 tablet once a  day  Patient Instructions: 1)  Please follow up with me in 2 weeks for your blood pressure. 2)  Please buy some Sudafed over the counter for your ears and ask the pharmacist if you can use it with your hypertension. 3)  Just come back to get your urine checked. 4)  Thank you and be blessed! Prescriptions: HYDROCHLOROTHIAZIDE 25 MG TABS (HYDROCHLOROTHIAZIDE) take 1 tablet once a day  #30 x 2   Entered and Authorized by:   Magnus Ivan MD   Signed by:   Magnus Ivan MD on 01/04/2010   Method used:   [facility]        [facility] [address] [phone]* (retail)       [address]       [place], [place]  [address]       Ph: [phone] or [phone]       Fax: [phone]   RxID:   [id number]

## 2011-01-10 NOTE — Progress Notes (Signed)
Summary: Rx Req  Phone Note Refill Request Call back at Home Phone [phone] Message from:  Patient  Refills Requested: Medication #1:  NORVASC 10 MG TABS Take 1 tablet by mouth once a day [facility] SAYS THEY DID NOT GET THE ONE FOR HUMULIN.  Initial call taken by: Clydell Hakim,  January 10, 2010 2:00 PM    Prescriptions: HUMULIN N 100 UNIT/ML SUSP (INSULIN ISOPHANE HUMAN) 38 units subcutaneously in am and 15 units in pm.  #3 x 11   Entered by:   Golden Circle RN   Authorized by:   Magnus Ivan MD   Signed by:   Golden Circle RN on 01/10/2010   Method used:   [facility] to        [facility] [address] [phone]* (retail)       [address]       [place], [place]  [address]       Ph: [phone] or [phone]       Fax: [phone]   RxID:   [id number] NORVASC 10 MG TABS (AMLODIPINE BESYLATE) Take 1 tablet by mouth once a day  #30 x 2   Entered by:   Golden Circle RN   Authorized by:   Magnus Ivan MD   Signed by:   Golden Circle RN on 01/10/2010   Method used:   [facility] to        [facility] [address] [phone]* (retail)       [address]       [place], [place]  [address]       Ph: [phone] or [phone]       Fax: [phone]   RxID:   [id number]

## 2011-01-10 NOTE — Miscellaneous (Signed)
Summary: RX  Pt dropped off forms from [name] for RX refills so she can get them through the mail. placed forms in triage. Destiny Morton  October 13, 2009 1:03 PM    Form placed in MD box...............................................Marland KitchenGaren Grams LPN October 13, 2009 3:44 PM

## 2011-01-10 NOTE — Letter (Signed)
Summary: Generic Letter  [name]  [address]   [place], [place] [address]   Phone: [phone]  Fax: [phone]    05/03/2010  SHAKORA NORDQUIST [address] [facility] [address] [place], [place]  [address]  Dear Ms. Lemen,  Enclosed in the envelope is a copy of the bloodwork that we recently drew.  I have highlighted the abnormal values.  Please make an appointment to see me as soon as possible to determine what kind of medication we should use to help with some of these abnormal lab values.  Thank you and be blessed!   Sincerely,    Magnus Ivan MD    Appended Document: Generic Letter mailed.

## 2011-01-10 NOTE — Progress Notes (Signed)
Summary: Rx  Phone Note Refill Request Call back at Home Phone [phone]   Refills Requested: Medication #1:  METOPROLOL TARTRATE 50 MG TABS 1 tab by mouth two times a day pt is completely out as of last wed.   Initial call taken by: Knox Royalty,  November 05, 2010 9:49 AM  Follow-up for Phone Call        I refilled her metoprolol. thank you Follow-up by: Edd Arbour,  November 05, 2010 11:53 AM

## 2011-01-10 NOTE — Assessment & Plan Note (Signed)
Summary: discuss labs wp   Vital Signs:  Patient Profile:   55 Years Old Female Height:     66.25 inches Weight:      151.6 pounds BMI:     24.37 Temp:     98.5 degrees F oral Pulse rate:   83 / minute BP sitting:   169 / 102  (left arm) Cuff size:   regular  Pt. in pain?   yes    Location:   abdomen-cramps    Intensity:   7  Vitals Entered By: Dedra Skeens CMA, (September 16, 2008 8:31 AM)                       PCP:  Johney Maine MD  Chief Complaint:  discuss labs.  History of Present Illness: cc: wants labs 1)htn: Pt doing well.  Taking prescribed medications as indicated.  Denies chest pain, shortness of breath,  edema.  Denies HA, vision changes. Says BP at home is in 130s.  Taking meds.    2)DM: Pt doing well.  Taking medications as prescribed.  Denies increased urination, being thirsty, vision changes, signs of low sugars.   States cbgs in am and lunch are 180s.  nighttime readings 220s.  3)vag d/c:  says has "lots going on".  NO worries about sexually transmitted diseases.   Reviewed medical history.  Updated and reviewed medications.       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 Lives with husband and 4 children. No alcohol, tobacco, IVDA.   Husband is [place].  Neither like to take meds and rather use herbal meds    Review of Systems      See HPI   Physical Exam  General:     Well-developed,well-nourished,in no acute distress; alert,appropriate and cooperative throughout examination Lungs:     Normal respiratory effort, chest expands symmetrically. Lungs are clear to auscultation, no  crackles or wheezes. Heart:     Normal rate and regular rhythm. S1 and S2 normal without gallop, murmur, click, rub or other extra sounds. Extremities:     no edema    Impression & Recommendations:  Problem # 1:  HYPERTENSION, BENIGN SYSTEMIC (ICD-401.1) Assessment: Deteriorated Difficult patient as she does not routinely take meds.  Will have her check BP at home and bring in readings.  Cmet. Her updated medication list for this problem includes:    Metoprolol Succinate 25 Mg Tb24 (Metoprolol succinate) .Marland Kitchen... Take 1 tablet once a day    Norvasc 10 Mg Tabs (Amlodipine besylate) .Marland Kitchen... Take 1 tablet by mouth once a day    Diovan Hct 160-25 Mg Tabs (Valsartan-hydrochlorothiazide) .Marland Kitchen... 1 tab a day  BP today: 169/102 Prior BP: 157/90 (05/05/2008)  Labs Reviewed: Creat: 0.85 (10/23/2007) Chol: 158 (10/23/2007)   HDL: 38 (10/23/2007)   LDL: 104 (10/23/2007)   TG: 80 (10/23/2007)  Orders: Comp Met-[facility] ([id number]) [facility]- Est  Level 4 (28413)   Problem # 2:  VAGINAL DISCHARGE (ICD-623.5) called patient but no answer.  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  patient does not want to change to long acting insulin.  was upset b/c her bill to Pharm D clinic was almost $200.  I discussed that poorly controlled dm will lead to heart problems, cva, kidney failure.  Risk of MI.  Pt udnerstands risks but does not wish to change meds at this time.   Her updated medication list for this problem includes:    Aspirin 81 Mg Tbec (Aspirin) .Marland Kitchen... Take 1 tablet by mouth once a day    Humulin N 100 Unit/ml Susp (Insulin isophane human) .Marland KitchenMarland KitchenMarland KitchenMarland Kitchen 35 units subcutaneously in am and 17 units in pm.    Diovan Hct 160-25 Mg Tabs (Valsartan-hydrochlorothiazide) .Marland Kitchen... 1 tab a day  Orders: A1C-[facility] ([id number]) [facility]- Est  Level 4 (21308)  Labs Reviewed: HgBA1c: 10.8 (01/19/2008)    Creat: 0.85 (10/23/2007)      Complete Medication List: 1)  Aspirin 81 Mg Tbec (Aspirin) .... Take 1 tablet by mouth once a day 2)  Humulin N 100 Unit/ml Susp (Insulin isophane human) .... 35 units subcutaneously in am and 17 units in pm. 3)  Metoprolol Succinate 25 Mg Tb24 (Metoprolol succinate) .... Take 1 tablet once a day 4)  Norvasc 10 Mg Tabs (Amlodipine besylate) .... Take 1 tablet by mouth once a day 5)  Bd Insulin Syringe Ultrafine 30g X 1/2" 0.5 Ml Misc (Insulin syringe-needle u-100) .... Inject subcutaneously two times a day as  directed 6)  Diovan Hct 160-25 Mg Tabs (Valsartan-hydrochlorothiazide) .Marland Kitchen.. 1 tab a day 7)  Accu-chek Compact Strp (Glucose blood) .... Use 4 times a day as directed disp: qs x one month 8)  Metronidazole 500 Mg Tabs (Metronidazole) .Marland Kitchen.. 1 tab by mouth two times a day for 7 days  Other Orders: Lipid-[facility] ([id number])   Patient Instructions: 1)  Please schedule a follow-up appointment in 3 months. 2)  TAKE 2 DIOVAN A DAY.  PICK UP  NEW PRESCRIPTION WHEN YOU RUN OUT AND TAKE ONE A DAY. PRESCRIPTION IS AT PHARMACY 3)  CONTINUE TO TAKE ALL YOUR MEDICINES.  CALL BEFORE YOU RUN OUT IF YOU NEED REFILLS.  4)  WRITE DOWN YOUR BLOOD PRESSURES AND BRING SHEET INTO CLINIC. 5)  CHECK YOUR SUGARS three times a day AND WRITE NUMBERS DOWN    Prescriptions: METRONIDAZOLE 500 MG TABS (METRONIDAZOLE) 1 tab by mouth two times a day for 7 days  #14 x 0   Entered and Authorized by:   Johney Maine MD   Signed by:   Johney Maine MD on 09/16/2008   Method used:   [facility]        [facility] [address] [phone]* (retail)       [address]       [place], [place]  [address]       Ph: [phone] or [phone]       Fax: [phone]   RxID:   [id number] DIOVAN HCT 160-25 MG TABS (VALSARTAN-HYDROCHLOROTHIAZIDE) 1 tab a day  #30 x 11   Entered and Authorized by:   Johney Maine MD   Signed by:   Johney Maine MD on  09/16/2008   Method used:   [facility]        [facility] [address] [id number] (retail)       [address]       [place], [place]  [address]       Ph: [phone] or [phone]       Fax: [phone]   RxID:   [id number]  ] Laboratory Results  Date/Time Received: September 16, 2008 8:56 AM  Date/Time Reported: September 16, 2008 9:09 AM   [place] Source: Vaginal WBC/hpf: 5-8 Bacteria/hpf: 3+ Clue cells/hpf: few Yeast/hpf: none Trichomonas/hpf: none Comments: ...........test performed by...........Marland KitchenTerese Door, CMA

## 2011-01-10 NOTE — Progress Notes (Signed)
Summary: Rx  Phone Note Call from Patient Call back at Home Phone [phone] Call back at [phone]   Summary of Call: wants to speak with rn about "some medication" - wouldn't elaborate. Initial call taken by: Haydee Salter,  December 12, 2008 11:04 AM  Follow-up for Phone Call        lmom to call back Follow-up by: Alphia Kava,  December 12, 2008 11:23 AM  Additional Follow-up for Phone Call Additional follow up Details #1::        pt states she continues to cough and have elevated blood sugars. She wants to know if this is caused by her diovan? Advised pt to come in for an ov per 11/07/08 note. She has an appt scheduled 01-06-08. Advised pt to come in and be seen sooner by another physician  to address her elevated blood sugars. She stated i will check y schedule and see if i can come in earlier.  Additional Follow-up by: Alphia Kava,  December 12, 2008 11:28 AM

## 2011-01-10 NOTE — Assessment & Plan Note (Signed)
Summary: r arm pain & loss of from   Vital Signs:  Patient Profile:   55 Years Old Female Height:     66.25 inches Weight:      149.9 pounds Pulse rate:   95 / minute BP sitting:   180 / 100  (left arm) Cuff size:   regular  Pt. in pain?   yes    Location:   right shoulder  Vitals Entered By: Arlyss Repress CMA, (February 03, 2009 3:54 PM)              Is Patient Diabetic? No     PCP:  JESSICA TRICHE MD  Chief Complaint:  right shoulder pain. right leg feels like 'going out' per pt..  History of Present Illness: Patient is a 55 yr old female, R-hand dominant, with history of DM, HTN and hyperlipidemia, who presents on the third day after experiencing sudden weakness and loss of control of her R arm and R leg.  States that this episode has been painless, not accompanied by dysarthria or trouble swallowing, or any visual disturbance.  She has experienced some difficulties maintaining balance over the same time frame.  No prior similar episodes.  Had a herniated disc in her low back 5 yrs ago that produced shooting pains down her R leg, but no such pains have been present during this episode.  No CVA history for patient, no ACS history.   ROS: She denies visual changes, denies chest pain or dyspnea, denies cough or aspiration.  No fevers or chills, no nuchal tightness. Has had an intermittent HA on her L parietal area.   Hx; Mother had CVA in her 78s.  No other CVA history in family.     Current Allergies: * HYZAAR      Physical Exam  General:     Alert, oriented to date, place, time.  No apparent distress.  Head:     Normocephalic and atraumatic without obvious abnormalities. No apparent alopecia or balding. Eyes:     EOMI, PERRL.  Fundi with no appreciable papilledema Mouth:     Oral mucosa and oropharynx without lesions or exudates.  Teeth in good repair. Palate raises symmetrically Neck:     No carotid bruits appreciated. Neck supple.  Lungs:     Normal  respiratory effort, chest expands symmetrically. Lungs are clear to auscultation, no crackles or wheezes. Heart:     Normal rate and regular rhythm. S1 and S2 normal without gallop, murmur, click, rub or other extra sounds. Abdomen:     Bowel sounds positive,abdomen soft and non-tender without masses, organomegaly or hernias noted. Extremities:     No pedal edema. Neurologic:     Gait: Right arm drop while walking; lacks cadence of arm-swing while walking.  Rhomberg negative.  Appears slightly unsteady when getting up/down from exam table.  Sensation grossly intact in all 4 extremities.  Handgrip grossly symmetric; marked right-sided weakness with UE extension/flexion at elbow, shoulder (3/5 on R, 5/5 on L).  Notable weakness with hip flexion on R side (3/5). DTRs: patellar reflexes 1+ symmetric bilat.; no clonus in feet on exam.     Impression & Recommendations:  Problem # 1:  CVA (ICD-434.91) Patient with DM, HTN, hyperlipidemia who presents with R arm and leg weakness acutely beginning 3 days ago; no pain, no trauma or injury. Accompanied by balance disturbance.  Denies visual disturbance.  Her ABCD-score is 6 (2 pts for unilat weakness, 2 pts duration>2min, 1 pt each for BP>140/90,  DM).  Given that today is Friday afternoon, I believe that admission with inpatient workup is indicated for this patient.  She agrees to present to [facility] for direct admission, will order MRi/MRA with carotids; ECG; telemetry; bedside swallow eval and diet afterward; home medication regimen and sliding scale insulin wiht CBGs during admission.  I have discussed with Dr Clelia Croft, who will sign-out this patient to the admitting intern, Dr. Wallene Huh.   Her updated medication list for this problem includes:    Aspirin 81 Mg Tbec (Aspirin) .Marland Kitchen... Take 1 tablet by mouth once a day  Orders: [facility]- Est Level  5 ([address]109)   Problem # 2:  HYPERTENSION, BENIGN SYSTEMIC (ICD-401.1) Patient with notable elevation in BP at  this time. care not to drop BP precipitously in this patient with presumed stroke.   Her updated medication list for this problem includes:    Metoprolol Succinate 25 Mg Tb24 (Metoprolol succinate) .Marland Kitchen... Take 1 tablet once a day    Norvasc 10 Mg Tabs (Amlodipine besylate) .Marland Kitchen... Take 1 tablet by mouth once a day    Diovan Hct 160-12.5 Mg Tabs (Valsartan-hydrochlorothiazide) .Marland Kitchen... 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  May consider longer-acting once-daily insulin such as Lantus or Levemir.   Her updated medication list for this problem includes:    Aspirin 81 Mg Tbec (Aspirin) .Marland Kitchen... Take 1 tablet by mouth once a day    Humulin N 100 Unit/ml Susp (Insulin isophane human) .Marland KitchenMarland KitchenMarland KitchenMarland Kitchen 38 units subcutaneously in am and 20 units in pm.    Diovan Hct 160-12.5 Mg Tabs (Valsartan-hydrochlorothiazide) .Marland Kitchen... 1 tab by mouth daily  Orders: [facility]- Est Level  5 (09811)   Complete Medication List: 1)  Aspirin 81 Mg Tbec (Aspirin) .... Take 1 tablet by mouth once a day 2)  Humulin N 100 Unit/ml Susp (Insulin isophane human) .... 38 units subcutaneously in am and 20 units in pm. 3)  Metoprolol Succinate 25 Mg Tb24 (Metoprolol succinate) .... Take 1 tablet once a day 4)  Norvasc 10 Mg Tabs (Amlodipine besylate) .... Take 1 tablet by mouth once a day 5)  Bd Insulin Syringe Ultrafine 30g X 1/2" 0.5 Ml Misc (Insulin syringe-needle u-100) .... Inject subcutaneously two times a day as  directed 6)  Diovan Hct 160-12.5 Mg Tabs (Valsartan-hydrochlorothiazide) .Marland Kitchen.. 1 tab by mouth daily 7)  Accu-chek Compact Strp (Glucose blood) .... Use 4 times a day as directed disp: qs x one month

## 2011-01-10 NOTE — Assessment & Plan Note (Signed)
Summary: wp   Vital Signs:  Patient Profile:   55 Years Old Female Weight:      154 pounds Pulse rate:   89 / minute BP sitting:   177 / 101  (left arm)                 PCP:  JESSICA TRICHE MD  Chief Complaint:  Diabetes Management.  History of Present Illness: 55 yo AAF presents for DM and insulin education.  Patient currently on Novolin N 35 units in the am and 17 units in the pm.  She is hesitant to increase her insulin quickly because she had a low blood sugar many years ago and it scared her.  She understands signs of hypoglycemia but reports no symptoms lately. She states her goal is to get her blood sugar between 150 and 175 mg/dl.   She is checking her blood sugars three to four times a day.  Her lowest reading in the last two weeeks is 189 mg/dl and the highest reading is 230 mg/dl.  She is interested in switching to Lantus but wants information on it first She reports only taking Hyzaar 100/25 mg 1/2 tab daily because she feels dizzy with a full tablet.  She also is not taking simvastatin because it caused muscle pain,especially in her back.  Her blood pressure today is elevated.  On repeat is was 153/97.    Diabetes Management History:      She has not been enrolled in the "Diabetic Education Program".  She states lack of understanding of dietary principles and is not following her diet appropriately.  She is checking home blood sugars.  She says that she is not exercising regularly.       Updated Prior Medication List: ASPIRIN 81 MG TBEC (ASPIRIN) Take 1 tablet by mouth once a day HUMULIN N 100 UNIT/ML SUSP (INSULIN ISOPHANE HUMAN) 35 units subcutaneously in am and 17 units in pm. 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  She would like to have information on Lantus and will make a decision in the next 2 weeks.  She will continue Novolin N 35 units qam and 17 units qpm.  She will continue increasing her Novolin by 1 unit as prescribed by Dr. Karn Pickler.  Follow up in Pharmacy clinic in 2 weeks. The following medications were removed from the medication list:    Hyzaar 100-25 Mg Tabs (Losartan potassium-hctz) .Marland Kitchen... Take 1 tablet by mouth once a day  Her updated medication list for this problem includes:    Aspirin 81 Mg Tbec (Aspirin) .Marland Kitchen... Take 1 tablet by mouth once a day    Humulin N 100 Unit/ml Susp (Insulin isophane human) .Marland KitchenMarland KitchenMarland KitchenMarland Kitchen 35 units subcutaneously in am and 17 units in pm.    Diovan Hct 80-12.5 Mg Tabs (Valsartan-hydrochlorothiazide) .Marland Kitchen... Take 1 tablet daily  Orders: Inital Assessment Each - [facility] (33295)   Problem # 2:  HYPERTENSION, BENIGN SYSTEMIC (ICD-401.1) Assessment: Deteriorated Currently not at goal  of less than 130/80.  Patient decreased her Hyzaar due to dizziness.  Will dc  Hyzaar and start Diovan HCT 80/12.5 mg daily.  Patient reported a cough with ACEIs.   The following medications were removed from the medication list:    Hyzaar 100-25 Mg Tabs (Losartan potassium-hctz) .Marland Kitchen... Take 1 tablet by mouth once a day  Her updated medication list for this problem includes:    Metoprolol Succinate 25 Mg Tb24 (Metoprolol succinate) .Marland Kitchen... Take 1 tablet once a day    Norvasc 10 Mg Tabs (Amlodipine besylate) .Marland Kitchen... Take 1 tablet by mouth once a day    Diovan Hct 80-12.5 Mg Tabs (Valsartan-hydrochlorothiazide) .Marland Kitchen... Take 1 tablet daily  Orders: Inital Assessment Each - [facility] (04540)   Problem # 3:  HYPERLIPIDEMIA  (ICD-272.4) Assessment: Unchanged Currently not taking simvastatin due muscle pain.  Will need to recheck her LDL at next visit and treat appropriately.   Pravastatin may be an option.   The following medications were removed from the medication list:    Simvastatin 20 Mg Tabs (Simvastatin) .Marland Kitchen... Take 1 tablet by mouth at bedtime  Orders: Inital Assessment Each - [facility] (98119)   Complete Medication List: 1)  Aspirin 81 Mg Tbec (Aspirin) .... Take 1 tablet by mouth once a day 2)  Humulin N 100 Unit/ml Susp (Insulin isophane human) .... 35 units subcutaneously in am and 17 units in pm. 3)  Metoprolol Succinate 25 Mg Tb24 (Metoprolol succinate) .... Take 1 tablet once a day 4)  Norvasc 10 Mg Tabs (Amlodipine besylate) .... Take 1 tablet by mouth once a day 5)  Bd Insulin Syringe Ultrafine 30g X 1/2" 0.5 Ml Misc (Insulin syringe-needle u-100) .... Inject subcutaneously two times a day as  directed 6)  Diovan Hct 80-12.5 Mg Tabs (Valsartan-hydrochlorothiazide) .... Take 1 tablet daily  Diabetes Management Assessment/Plan:      The following lipid goals have been established for the patient: Total cholesterol goal of 200; LDL cholesterol goal of 100; HDL cholesterol goal of 40; Triglyceride goal of 200.     Patient Instructions: 1)  Please schedule a follow-up appointment in 2 weeks with the pharmacy clinic. 2)  Check your blood sugars regularly. If your readings are usually above 300: or below 70 you should contact our office.   3)  Continue to check your blood sugar three to four times a day. 4)  Continue Novolin N 35 units in the morning and 17 units in the evening.  Increase by 1 unit daily if your morning blood sugar is above 120 mg/dl. 5)  Stop Hyzaar.  Start Diovan HCT 80/12.5 mg once daily. 6)  Read the information given about Lantus and will discuss the switch to Lantus at next pharmacy visit.    Prescriptions: DIOVAN HCT 80-12.5 MG  TABS (VALSARTAN-HYDROCHLOROTHIAZIDE) Take 1  tablet daily  #30 x 3   Entered by:   Skarleth Delmonico  Pharm D   Authorized by:   Johney Maine MD   Signed by:   Steve Rattler  Pharm D on 03/24/2008   Method used:   Electronically sent to ...       [facility] [address] [phone]*       [address]       [place], [place]  [address]       Ph: [phone] or [phone]       Fax: [phone]   RxID:   [id number]  ]

## 2011-01-10 NOTE — Progress Notes (Signed)
       Additional Follow-up for Phone Call Additional follow up Details #2::    states the new bp meds have raised her cbgs to 400s in ams. wants to stop taking them. made her an appt tomorrow am to discuss with md (pcp not available). asked that she bring all med bottles with her & do a food diary for the rest of today. states she has DM x 30 yrs & they have never been this high Follow-up by: Golden Circle RN,  December 13, 2008 9:49 AM

## 2011-01-10 NOTE — Progress Notes (Signed)
Summary: results  Phone Note Call from Patient Call back at Home Phone [phone]   Caller: Patient Summary of Call: wants to know results of UA Initial call taken by: De Nurse,  August 02, 2010 2:16 PM  Follow-up for Phone Call        told her report was negative.Golden Circle RN  August 02, 2010 2:28 PM  Follow-up by: Golden Circle RN,  August 02, 2010 2:28 PM

## 2011-01-10 NOTE — Miscellaneous (Signed)
Summary: Medication Form  Prescriptions: BD INSULIN SYRINGE ULTRAFINE 30G X 1/2" 0.5 ML  MISC (INSULIN SYRINGE-NEEDLE U-100) inject subcutaneously two times a day as  directed  #180 x 6   Entered and Authorized by:   Johney Maine MD   Signed by:   Johney Maine MD on 08/30/2008   Method used:   Print then Give to Patient   RxID:   [id number]  Patient dropped off form to be faxed for a refill of medication. KATHY HARRELSON  August 29, 2008 11:21 AM  form placed in pcp chart box.Kennon Rounds CALDWELL RN  August 29, 2008 11:48 AM  Printed rx and signed form.  Placed in to be faxed pile.  Briasia Flinders MD, Kellsey Sansone August 30, 2008 8:29 AM

## 2011-01-10 NOTE — Progress Notes (Signed)
Summary: Triage  Phone Note Call from Patient Call back at Home Phone [phone]   Summary of Call: wants to speak with someone about side effects of diovan. Initial call taken by: Haydee Salter,  November 07, 2008 4:38 PM  Follow-up for Phone Call        Pt states she has a dry cough since diovan/hct dosage was increased.  States it is a cough similar to what she had before when taking a different bp med years ago.  Advised ARBs are not meds that usually cause a cough, that class of med is [facility] inhibitor.  Pt states the cough is very bothersome, and wants to know Dr. Karn Pickler opinion on whether she thinks it is the diovan causing the cough. Follow-up by: AMY MARTIN RN,  November 07, 2008 5:02 PM  Additional Follow-up for Phone Call Additional follow up Details #1::        We have not increased the dose since May.  An increased dose should not cause a cough.  If she cannot tolerate the medicine she may come in to discuss alternative medicines.   She is past due for an appt and has missed several over the year.  If she misses another appt she may be dismissed from the practice.   Additional Follow-up by: Johney Maine MD,  November 07, 2008 5:05 PM    Additional Follow-up for Phone Call Additional follow up Details #2::    Phone call to pt- no answer, unable to leave voicemail. Follow-up by: AMY MARTIN RN,  November 08, 2008 4:24 PM  Additional Follow-up for Phone Call Additional follow up Details #3:: Details for Additional Follow-up Action Taken: Notified pt of above message from Dr. Karn Pickler.  Advised if she cannot tolerate the med she should schedule an office visit to discuss.  Advised she needs an office visit to f/u on bp as missed her last appt, also notified pt that she may be dismissed if she misses another appt.  Pt states she will call back to schedule an appt for a day that she knows she will be able to come.   Additional Follow-up by: AMY MARTIN RN,  November 09, 2008 1:48  PM

## 2011-01-10 NOTE — Assessment & Plan Note (Signed)
Summary: meet new dr/hypertension medicine concerns   Vital Signs:  Patient profile:   55 year old female Weight:      149.6 pounds Temp:     98.5 degrees F oral Pulse rate:   76 / minute BP sitting:   151 / 89  (left arm)  Vitals Entered By: Alphia Kava (June 19, 2009 8:35 AM) CC: meet new MD/need a change in hypertension medication Is Patient Diabetic? Yes   Chief Complaint:  meet new MD/need a change in hypertension medication.  History of Present Illness: Pt has been experiencing side effects to Diovan.  Pt reports blurred vision, weakness in arms and legs with a reduction in function and shortness of breath to the point that she could not talk.  Pt stopped taking drug about 2 weeks prior to clinic presentation and felt better physically yet her blood pressures have not been optimal.  During triage call, pt stated bp's running 145/88, 150/78, 115/80, 120/80.  Patient was first on Hyzaar and was dizzy all the time before switching to Diovan.    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 Pt also states that her diabetes started as gestational diabetes about 26 years. Patient has had 4 vaginal births.  Still getting regular periods.  Last period was the end of June.  Past Surgical History: patient reports no surgeries  Family History: grandmother still alive (65) has htn mother still alive (25) had CVA, htn father died at age 50 something.  had colon cancer and DM2  Social History: Patient has been uemployed for the  last 5-6 years.  Lives with husband.  All four of her children have moved out of the house.  Never Smoked Alcohol use-no Drug use-no Patient says she walks some.  She reports that her diet is not good. She eats [employer] 2x/week.  She does not overeat but has trouble with diet over holidays.  She does not use salt and does not eat candy, ice cream or cookies (unless these items are sugar free).  Her only vice is bread. Drug Use:  no  Review of Systems ENT:  Denies earache and sore throat; denies ear itching. CV:  Denies chest pain or discomfort. Resp:  Denies cough. GI:  Complains of abdominal pain and gas; denies constipation, diarrhea, nausea, and vomiting; pt experiences abdominal pain with menses. GU:  Denies urinary frequency and urinary hesitancy. MS:  R foot swelling, denies back pain, occasional R leg pain. Derm:  Complains of itching; denies rash. Neuro:  Complains of headaches and weakness; denies numbness. Heme:  Denies bleeding; no history of anemia.  Physical Exam  General:  alert, well-developed, and well-nourished.   Neck:  no thyromegaly   Lungs:  normal respiratory effort, normal breath sounds, no crackles, and no wheezes or ronchi.   Heart:  normal rate, regular rhythm, no murmur, no gallop, and no rub.   Pulses:  R radial normal, L radial normal, and L dorsalis pedis normal.  unable to palpate R dorsalis pedis pulse Neurologic:  sensation intact to light touch bilaterally in upper and lower extremities  no reflexes detected in upper and lower extremities rapid alternating hand movements intact strength normal in all extremities and heel-to-shin normal.   Psych:  slightly anxious.     Foot/Ankle Exam  Skin:    Healed lesion with band aid on R foot.    Inspection:    No ulcers on both feet.   Impression & Recommendations:  Problem # 1:  HYPERTENSION, BENIGN SYSTEMIC (ICD-401.1) Assessment Deteriorated  Patient came to see [place] because her medication was  enabling her proper functioning.  Therefore, we discontinued this medication for now.  In about 4 weeks we will try a new drug, Lotensin after we determine that her simvistatin is not causing her any difficulty. The following medications were removed from the medication list:    Diovan Hct 160-12.5 Mg Tabs (Valsartan-hydrochlorothiazide) .Marland Kitchen... 1 tab by mouth daily Her updated medication list for this problem includes:    Metoprolol Tartrate 50 Mg Tabs (Metoprolol tartrate) ..... Increased as per her cardiologist    Norvasc 10 Mg Tabs (Amlodipine besylate) .Marland Kitchen... Take 1 tablet by mouth once a day  Orders: [facility]- Est  Level 4 (04540)  Problem # 2:  HYPERLIPIDEMIA (ICD-272.4) Assessment: Deteriorated Patient discontinued simvastatin due to concern over causing problems described in the HPI.  Due to patient being diabetic and mildly elevated LDL and mildy reduced HDLs (as of May 2010), we want patient to resume this medication at previous dose and return in 2 weeks to assess any negative effects of the drug. Her updated medication list for this problem includes:    Simvastatin 20 Mg Tabs (Simvastatin) .Marland Kitchen... 1 tab by mouth at bedtime  Problem # 3:  DIABETES MELLITUS, II, COMPLICATIONS (ICD-250.92) Assessment: Unchanged Patient reports experiencing hypoglycemia only once a month (nervousness and a general feeling of knowing she is hypoglycemic).  She also reports no increased thirst or loss of sensation in legs and feet.  She checks blood sugars and they run in the 150's.  She does not check her feet at night for ulcers.  She is compliant with medication.  She was advised by Dr. Karn Pickler to change to Lantus, but patient has a hard time making drug changes.  Explained to patient about checking feet at night and about not going barefoot.  Furthermore, she is supposed to ease up to 20 U of Humulin in the afternoon but is still taking 15 U.  Might consider increasing medication to possibly reduce blood  sugars. The following medications were removed from the medication list:    Diovan Hct 160-12.5 Mg Tabs (Valsartan-hydrochlorothiazide) .Marland Kitchen... 1 tab by mouth daily Her updated medication list for this problem includes:    Aspirin 81 Mg Tbec (Aspirin) .Marland Kitchen... Take 1 tablet by mouth once a day    Humulin N 100 Unit/ml Susp (Insulin isophane human) .Marland KitchenMarland KitchenMarland KitchenMarland Kitchen 38 units subcutaneously in am and 15 units in pm.  Orders: A1C-[facility] (98119)  Problem # 4:  CVA (ICD-434.91) Assessment: Unchanged Due to h/o watershed infarct, I asked patient about stroke symptoms (loss of sensation on one side, face drooping and slurred speech) and she denied any of those symptoms. Her updated medication list for this problem includes:    Aspirin 81 Mg Tbec (Aspirin) .Marland Kitchen... Take 1 tablet by mouth once a day  Complete Medication List: 1)  Aspirin 81 Mg Tbec (Aspirin) .... Take 1 tablet by mouth once a day 2)  Humulin N 100 Unit/ml Susp (Insulin isophane  human) .... 38 units subcutaneously in am and 15 units in pm. 3)  Metoprolol Tartrate 50 Mg Tabs (Metoprolol tartrate) .... Increased as per her cardiologist 4)  Norvasc 10 Mg Tabs (Amlodipine besylate) .... Take 1 tablet by mouth once a day 5)  Bd Insulin Syringe Ultrafine 30g X 1/2" 0.5 Ml Misc (Insulin syringe-needle u-100) .... Inject subcutaneously two times a day as  directed 6)  Accu-chek Compact Strp (Glucose blood) .... Use 4 times a day as directed disp: qs x one month 7)  Simvastatin 20 Mg Tabs (Simvastatin) .Marland Kitchen.. 1 tab by mouth at bedtime  Patient Instructions: 1)  You discontinued your Simvistatin which will help with your cholesterol.  We want you to restart your drug at 20 mg once a day.  We will follow up with you in 2 weeks to assess how you are reacting to this drug. 2)  In two weeks we will give you a low dose of a drug named Lotensin if you are having no problems with simvistatin.  Then you will have to follow up in 2 weeks to assess how you are reacting to this  drug. 3)  In the meantime, please try to increase your exercise and develop better eating habits.  Potentially reduce the amount of bread you eat if that helps. 4)  Thank you and be blessed!  Appended Document: meet new dr/hypertension medicine concerns For problem #1 (benign systemic hypertension), we will start Lotensin in 2 weeks (after 2 week trial of simvistatin), not 4 weeks as noted in my office note.

## 2011-01-10 NOTE — Assessment & Plan Note (Signed)
Summary: medications/el   Vital Signs:  Patient Profile:   55 Years Old Female Weight:      151 pounds Pulse rate:   93 / minute BP sitting:   181 / 101  (right arm)  Pt. in pain?   no  Vitals Entered By: Arlyss Repress CMA, (October 02, 2007 1:51 PM)              Is Patient Diabetic? Yes      PCP:  Johney Maine MD  Chief Complaint:  f/up htn and dm/refill meds/ declined flu shot.  History of Present Illness: cc: meds HPI:  55 yo female who has not seen me in a while.  Presents w/ several questions 1)DM:  Pt takes Humulin and has refused Lantus in past.  Takes 36 units in am and 16 units in pm. Says sugars run high, in 150s if she takes them.  Never low.  says she eats "bad" and likes pastas and breads.  Actuallly becomes tearful when talking about eating [place] and chocolate milk for breakfast.  Expresses interest in Lantus.  Denies CP.  Denies dizziness, shakiness 2)HTN:  Has been taking her meds as directed except only 5mg  of amlodipine instead of 10mg .  Denies CP, SOB, edema.  Says her BP checks at  home are usuall 120s-130s.  She is in pain now from menstrual cramping and thinks that is why BP is so high.  Also teary eyed when talking about BP.  Wants to know what medicines are good for her heart and if she had heart damage already.  Says Hyzaar makes her dizzy.  metoprolol helps alot.  Does not want to stop HCTZ.   3)menstrual bleeding:  Says periods have been getting heavier and with worse cramping.  Wants to know if menopausal.  Sometimes passes clots w/ periods.  No other vag d/c     Past Medical History:     Recurrant BV and vaginitis   Family History:    Reviewed history from 02/05/2007 and no changes required:       colon ca - father, CVA - mom, DM - father, HTN - mom, grandmother  Social History:    Reviewed history from 02/05/2007 and no changes required:       Works at [employer]. Lives with husband and 4 children. No alcohol, tobacco, IVDA.   Husband is  [place].  Neither like to take meds and rather use herbal eds   Risk Factors:  Tobacco use:  never Alcohol use:  no Exercise:  no   Review of Systems       The patient complains of abnormal bleeding.  The patient denies anorexia, fever, weight loss, vision loss, hoarseness, chest pain, syncope, dyspnea on exhertion, peripheral edema, prolonged cough, abdominal pain, melena, hematochezia, muscle weakness, suspicious skin lesions, depression, unusual weight change, and enlarged lymph nodes.         vag bleeding   Physical Exam  General:     Well-developed,well-nourished,in no acute distress; alert,appropriate and cooperative throughout examination Neck:     No deformities, masses, or tenderness noted. Lungs:     Normal respiratory effort, chest expands symmetrically. Lungs are clear to auscultation, no crackles or wheezes. Heart:     Normal rate and regular rhythm. S1 and S2 normal without gallop, murmur, click, rub or other extra sounds.  Diabetes Management Exam:    Foot Exam (with socks and/or shoes not present):       Sensory-Pinprick/Light touch:  Left medial foot (L-4): normal          Left dorsal foot (L-5): normal          Left lateral foot (S-1): normal          Right medial foot (L-4): normal          Right dorsal foot (L-5): normal          Right lateral foot (S-1): normal       Sensory-Monofilament:          Left foot: normal          Right foot: normal       Inspection:          Left foot: normal          Right foot: normal       Nails:          Left foot: normal          Right foot: normal    Impression & Recommendations:  Problem # 1:  DIABETES MELLITUS, II, COMPLICATIONS (ICD-250.92) Assessment: Deteriorated A1C elevated and worsening. Pt willing to try Lantus but wants to see Dr Michaelyn Barter to help w/ this.  Does not want to see Nutritionist b/c has seen 2 already. She understands diet but does not follow. Wants motivation to do so but is hard to  find.  Thinks that she is ready to really avoid breads, carbs. Will try and eat more veggies and work harder.   Her updated medication list for this problem includes:    Aspirin 81 Mg Tbec (Aspirin) .Marland Kitchen... Take 1 tablet by mouth once a day    Humulin N 100 Unit/ml Susp (Insulin isophane human) ..... Subcutaneously    Hyzaar 100-25 Mg Tabs (Losartan potassium-hctz) .Marland Kitchen... Take 1 tablet by mouth once a day  Orders: A1C-[facility] (16109) [facility]- Est  Level 4 (60454)  Future Orders: Lipid-[facility] ([id number]) ... 09/16/2008  Labs Reviewed: HgBA1c: 10.4 (10/02/2007)   Creat: 0.74 (01/16/2007)      Problem # 2:  HYPERTENSION, BENIGN SYSTEMIC (ICD-401.1) Assessment: Deteriorated Will decrease Hyzaar and gave samples.  Will increase Norvasc.  I do not believe that "dizziness" is related to Hyzaar but will try and see.  consider removing HCTZ and increasing Losartan.  pt says that HCTZ really helps.   HR can't tolerate increase in Metoprolol at this point.  Pt will keep BP diary.  Will not change further given dizzness.  f/u 1 month Her updated medication list for this problem includes:    Hyzaar 100-25 Mg Tabs (Losartan potassium-hctz) .Marland Kitchen... Take 1 tablet by mouth once a day    Metoprolol Succinate 25 Mg Tb24 (Metoprolol succinate) .Marland Kitchen... Take 1 tablet once a day    Norvasc 10 Mg Tabs (Amlodipine besylate) .Marland Kitchen... Take 1 tablet by mouth once a day  Orders: [facility]- Est  Level 4 (29562)  Future Orders: Comp Met-[facility] ([id number]) ... 09/09/2008   Problem # 3:  HYPERLIPIDEMIA (ICD-272.4) Assessment: Comment Only will RTC for FLP when comes to pharm clinic Her updated medication list for this problem includes:    Simvastatin 20 Mg Tabs (Simvastatin) .Marland Kitchen... Take 1 tablet by mouth at bedtime  Orders: [facility]- Est  Level 4 (96295)   Problem # 4:  VAGINAL BLEEDING (ICD-623.8) Assessment: New likelly perimenopausal.  Past due for pap. Will RTC for pap.  Address issue at this pt.  RTC earlier if severe  Problem #  5:  Preventive Health Care (ICD-V70.0) Assessment: Comment Only needs  mammogram and colonoscopy and desires both  Complete Medication List: 1)  Aspirin 81 Mg Tbec (Aspirin) .... Take 1 tablet by mouth once a day 2)  Humulin N 100 Unit/ml Susp (Insulin isophane human) .... Subcutaneously 3)  Hyzaar 100-25 Mg Tabs (Losartan potassium-hctz) .... Take 1 tablet by mouth once a day 4)  Metoprolol Succinate 25 Mg Tb24 (Metoprolol succinate) .... Take 1 tablet once a day 5)  Norvasc 10 Mg Tabs (Amlodipine besylate) .... Take 1 tablet by mouth once a day 6)  Simvastatin 20 Mg Tabs (Simvastatin) .... Take 1 tablet by mouth at bedtime 7)  Bd Insulin Syringe Ultrafine 30g X 1/2" 0.5 Ml Misc (Insulin syringe-needle u-100) .... Inject subcutaneously two times a day as  directed   Patient Instructions: 1)  Please schedule a follow-up appointment in 4-6 weeks for pap and  cholesterol check.  (if needed double book if NOT double booked on that day already) 2)  Schedule for Pharmacy clinic ASAP 3)  Increase amlodipine (Norvasc) to two  pills a day until run out and then call Dr Karn Pickler for 10mg  pills 4)  Stop current Hyzaar (save bottle with pills) and start samples, once a day    ] Laboratory Results   Blood Tests   Date/Time Received: October 02, 2007 1:58 PM  Date/Time Reported: October 02, 2007 2:14 PM   HGBA1C: 10.4%   (Normal Range: Non-Diabetic - 3-6%   Control Diabetic - 6-8%)  Comments: ...................................................................DONNA [facility]  October 02, 2007 2:14 PM

## 2011-01-10 NOTE — Assessment & Plan Note (Signed)
Summary: pelvic exam/eo   Vital Signs:  Patient profile:   55 year old female Height:      66 inches Weight:      147 pounds BMI:     23.81 Temp:     97.9 degrees F Pulse rate:   82 / minute BP sitting:   157 / 95  (left arm) Cuff size:   regular  Vitals Entered By: Dennison Nancy RN (Apr 11, 2009 8:33 AM) CC: Vaginal pain and discomfort Is Patient Diabetic? Yes   History of Present Illness: 1)vag discomfort:  burning/itching sensation for 2 weeks.  small amt of white discharge, also dark.  thinks it's a yeast infection.  seen 2 weeks ago but was uncomfortable having pelvic done by female provider.  has bad odor but cannot describe it.  NO worries about STDs. LMP 2 weeks ago  2)DM: Pt doing well.  Taking medications as prescribed except for one day where she didn't take her insulin.  Denies increased urination, being thirsty, vision changes, signs of low sugars.   Her sugars are MUCH improved and in 150s!  At first these numbers scared her but not she is happy about them. says at first they made her feel bad, but not any longer.  Thinks it's b/c she cut out breads, sweets and is eating more salads.  Says has had these numbers since [facility]  High cbg 170.  Does NOT want to start Lantus today  3)HTN: Pt doing well.  Taking prescribed medications as indicated (although has not taken this morning).  Denies chest pain, shortness of breath,  edema.  Denies HA, vision changes.   4)h/o CVA:  is taking simvastatin.  says work up normal.  has another MRA scheduled.   Habits & Providers     Tobacco Status: never     Tobacco Counseling: to remain off tobacco products  Allergies: 1)  * Hyzaar  Past History:  Past medical history reviewed for relevance to current acute and chronic problems.  Past Medical History:    Reviewed history from 02/07/2009 and no changes required:     Recurrant BV and vaginitis    DX with DM in Mid 20s, On insulin since  "early to  mid 40    CVA (ICD-434.91)    HYPERTENSION, BENIGN SYSTEMIC (ICD-401.1)    HYPERLIPIDEMIA (ICD-272.4)    DIABETES MELLITUS, II, COMPLICATIONS (ICD-250.92)            Review of Systems      See HPI  Physical Exam  General:  Well-developed,well-nourished,in no acute distress; alert,appropriate and cooperative throughout examination Genitalia:  Pelvic Exam:        External: normal female genitalia without lesions or masses        Vagina: normal without lesions or masses        Cervix:  small nabothian cyst 1 o'clock white discharge present        Adnexa: normal bimanual exam without masses or fullness        Uterus: normal by palpation        Pap smear: performed   Impression & Recommendations:  Problem # 1:  VAGINAL DISCHARGE (ICD-623.5) Assessment New yeast on wet prep.  will treat.  patient declined STD testing Orders: Wet Prep- [facility] [phone]) [facility]- Est  Level 4 (60454)  Problem # 2:  DIABETES MELLITUS, II, COMPLICATIONS (ICD-250.92) Assessment: Unchanged Sounds improved.  Needs A1C end of May or early June.  Again, advised patient that Lantus is preferred insulin as treating w/ short acting insulin is dangerous and not as effective.  patient continues to refuse this change but again says she will switch at next visit.  Encouraged good eating habits.  Informed her that 150s are much better and that 130-150 is a good goal Her updated medication list for this problem includes:    Aspirin 81 Mg Tbec (Aspirin) .Marland Kitchen... Take 1 tablet by mouth once a day    Humulin N 100 Unit/ml Susp (Insulin isophane human) .Marland KitchenMarland KitchenMarland KitchenMarland Kitchen 38 units subcutaneously in am and 20 units in pm.    Diovan Hct 160-12.5 Mg Tabs (Valsartan-hydrochlorothiazide) .Marland Kitchen... 1 tab by mouth daily  Orders: [facility]- Est  Level 4 (16109)  Problem # 3:  HYPERTENSION, BENIGN SYSTEMIC (ICD-401.1) Assessment: Unchanged Not at goal today but difficult to adjust meds as she hasn't taken them.  will follow Her updated medication  list for this problem includes:    Metoprolol Succinate 25 Mg Tb24 (Metoprolol succinate) .Marland Kitchen... Take 1 tablet once a day    Norvasc 10 Mg Tabs (Amlodipine besylate) .Marland Kitchen... Take 1 tablet by mouth once a day    Diovan Hct 160-12.5 Mg Tabs (Valsartan-hydrochlorothiazide) .Marland Kitchen... 1 tab by mouth daily  Orders: [facility]- Est  Level 4 (60454)  Problem # 4:  CVA (ICD-434.91) Assessment: Unchanged Need to discuss increasing her ASA to 325mg  at upcoming visit.  On a statin.  [facility]ed [facility].  carotid dopplers and stress test wnl Her updated medication list for this problem includes:    Aspirin 81 Mg Tbec (Aspirin) .Marland Kitchen... Take 1 tablet by mouth once a day  Problem # 5:  HYPERLIPIDEMIA (ICD-272.4)  Orders: Comp Met-[facility] ([id number]) Lipid-[facility] ([id number])  Complete Medication List: 1)  Aspirin 81 Mg Tbec (Aspirin) .... Take 1 tablet by mouth once a day 2)  Humulin N 100 Unit/ml Susp (Insulin isophane human) .... 38 units subcutaneously in am and 20 units in pm. 3)  Metoprolol Succinate 25 Mg Tb24 (Metoprolol succinate) .... Take 1 tablet once a day 4)  Norvasc 10 Mg Tabs (Amlodipine besylate) .... Take 1 tablet by mouth once a day 5)  Bd Insulin Syringe Ultrafine 30g X 1/2" 0.5 Ml Misc (Insulin syringe-needle u-100) .... Inject subcutaneously two times a day as  directed 6)  Diovan Hct 160-12.5 Mg Tabs (Valsartan-hydrochlorothiazide) .Marland Kitchen.. 1 tab by mouth daily 7)  Accu-chek Compact Strp (Glucose blood) .... Use 4 times a day as directed disp: qs x one month 8)  Fluconazole 150 Mg Tabs (Fluconazole) .Marland Kitchen.. 1 tab by mouth x one  Other Orders: Pap Smear-[facility] ([id number])  Patient Instructions: 1)  f/u end of May or early june for dm Prescriptions: FLUCONAZOLE 150 MG TABS (FLUCONAZOLE) 1 tab by mouth x one  #1 x 0   Entered and Authorized by:   Johney Maine MD   Signed by:   Johney Maine MD on 04/11/2009   Method used:   Electronically to        [facility] [phone]* (retail)        [address]       [place], [place]  [address]       Ph: [phone] or [phone]       Fax: [phone]   RxID:   [id number]    Laboratory Results  Date/Time Received: Apr 11, 2009 9:13 AM  Date/Time Reported: Apr 11, 2009 9:20 AM  Wet [place] Source: vag WBC/hpf: 15-20 Bacteria/hpf: 2+  Rods Clue cells/hpf: none  Negative whiff Yeast/hpf: few Trichomonas/hpf: none Comments: ...............test performed by......Marland KitchenBonnie A. [place], [place] (ASCP)

## 2011-01-10 NOTE — Miscellaneous (Signed)
Summary: ED visit  Clinical Lists Changes Pt seen in ED b/c she was concerned that her BP was elevated.  Missed appt at clinic due to this and worried about driving.  Seemed very anxious according to EDP.  EDP changed dose of metop to 75mg  two times a day.  Pt should be following up with [place] soon. Ellery Plunk MD  November 28, 2010 7:53 AM

## 2011-01-10 NOTE — Miscellaneous (Signed)
Summary: triage walk in  Clinical Lists Changes  pt states she thought she had appointment today but no appointment is on schedule. she has a concern regarding BP medication. she has been on Hyzaar and then later was started on toprol and Norvasc. has been on these meds for over a year.  has been experiencing dizziness and just feeling like she can not go while taking these meds. so 4-5 months ago she started taking 1/2 of the Hyzaar and feels better, she  thought she had the appointment today to discuss this with her doctor. appointment has been rescheduled for 03/15/08. checked BP today  with reading of 160/93. pulse 85. also patient complains with a slight sore throat for 1 week . her little grandson has recently been diagnosed with Strep.  no fever . temp checked today and is 98.3. appointment scheduled tomorrow for work in regarding sore throat. ..................................................................Marland KitchenTheresia Lo RN  March 08, 2008 4:43 PM

## 2011-01-10 NOTE — Progress Notes (Signed)
Summary: Triage  Phone Note Call from Patient Call back at Home Phone [phone]   Reason for Call: Talk to Nurse Summary of Call: has a rash on her bottom, thinks it may be caused from some recent medication she was given. Initial call taken by: Knox Royalty,  October 11, 2008 3:05 PM  Follow-up for Phone Call        Pt states she developed a rash on and around her buttocks and vagina 3 days ago.  Pt states she thinks it is related to diovan/hct or metronidozole.  Pt states she has been taking the diovan/hct for 4-5 months.  Advised if the rash is related to a med it is most likely the metronidazole.  She states she is so uncomfortable.  Advised she can try taking a benadryl, and hydrocortisone - but NOT in her vaginal area.  Scheduled her for appt with Dr. Karn Pickler tomorrow. Follow-up by: AMY MARTIN RN,  October 11, 2008 3:27 PM

## 2011-01-10 NOTE — Consult Note (Signed)
Summary: [facility]  [facility]   Imported By: Clydell Hakim 04/04/2009 14:58:50  _____________________________________________________________________  External Attachment:    Type:   Image     Comment:   External Document

## 2011-01-10 NOTE — Progress Notes (Signed)
Summary: triage  Phone Note Call from Patient Call back at Home Phone [phone]   Caller: Patient Summary of Call: pt thinks she has a UTI- wants to come in tomorrow Initial call taken by: De Nurse,  August 03, 2009 4:35 PM  Follow-up for Phone Call        pain with urination x 2 days. advised lots of fluids. may try cranberry juice or AZO. appt amde for 8:30am work in tomorrow Follow-up by: Golden Circle RN,  August 03, 2009 4:37 PM

## 2011-01-10 NOTE — Progress Notes (Signed)
Summary: meds prob  Phone Note Call from Patient Call back at Home Phone [phone] Call back at [phone]   Caller: Patient Summary of Call: states that the Metoprolol is not the same as what she had been taking and is concerned there was a mix up at the pharmacy Initial call taken by: De Nurse,  November 07, 2010 9:34 AM  Follow-up for Phone Call        pt is calling again and needs to talk to someone today Follow-up by: De Nurse,  November 07, 2010 2:17 PM  Additional Follow-up for Phone Call Additional follow up Details #1::        Please inform her that this is a new blood pressure medication started by Dr. Earnest Bailey because her blood pressure was not well controlled. I refilled her medication. Additional Follow-up by: Edd Arbour,  November 08, 2010 12:47 PM    Additional Follow-up for Phone Call Additional follow up Details #2::    spoke with pt concerning meds and informed her that she will take two pills instead of one Follow-up by: Loralee Pacas CMA,  November 08, 2010 3:08 PM

## 2011-01-10 NOTE — Progress Notes (Signed)
Summary: pls call  Phone Note Call from Patient Call back at Home Phone [phone] Call back at [phone]   Caller: Patient Summary of Call: pt would like to talk to Dr Mayford Knife.  She is not able to make this appt today and needs to talk to dr about her meds.  she resch for 8/19.   Initial call taken by: De Nurse,  July 03, 2009 8:41 AM  Follow-up for Phone Call        lmom to call back Follow-up by: Alphia Kava,  July 03, 2009 11:17 AM  Additional Follow-up for Phone Call Additional follow up Details #1::        So...this lady is rescheduled for the 19th of August, but I'd really like to see her back maybe this week, no later than next week because we restarted her back on her simvastatin, and we wanted to make sure she wasn't having side effects.  Then, we wanted to restart an ACE today as well to help control her blood pressure because she was having so many problems with hyzaar/diovan.     Additional Follow-up for Phone Call Additional follow up Details #2::    spoke with pt. she will try and come in this week. she states she will try her best but cannot say for sure if she will be able to come in, Follow-up by: Alphia Kava,  July 03, 2009 5:03 PM  Additional Follow-up for Phone Call Additional follow up Details #3:: Details for Additional Follow-up Action Taken: This is not an ideal situation due to her comorbidities; however, I realize that I do not fully understand her barriers to consistent medical care.  Hopefully, if she cannot come to see me in the next week or so, she will not become too ill.  I will just have to see her at her appt next month for the time being.   I will follow up with an attending to see if I personally should call her or to explore other actions that I should take.   After talking to Dr Leveda Anna on July 27, we agreed that we will let Mrs. Sedam set the pace of her own medical care, and I will see her at her next appointment. Additional Follow-up  by: Magnus Ivan MD,  July 03, 2009 6:09 PM

## 2011-01-10 NOTE — Progress Notes (Signed)
Summary: Triage  Phone Note Call from Patient Call back at Home Phone [phone]   Reason for Call: Talk to Nurse Summary of Call: pt wants to speak with someone about shoulder pain, can barely lift arm, also c/o R leg giving out on her. Initial call taken by: Haydee Salter,  February 03, 2009 8:55 AM  Follow-up for Phone Call        used tylenol which helped with the pain. states she cannot lift it high. started yesterday. denies injury. states her arm had been fine previously. now she cannot write with that hand due to arm loss of FROM.  appt at 4pm today  also states her r leg has been "going out" told her to speak with md Follow-up by: Golden Circle RN,  February 03, 2009 8:57 AM

## 2011-01-10 NOTE — Progress Notes (Signed)
Summary: triage  Phone Note Call from Patient Call back at Home Phone [phone]   Caller: Patient Summary of Call: Pt wondering if she can have something called in for cold and congestion.  [facility] [address] Initial call taken by: Clydell Hakim,  December 04, 2009 4:23 PM  Follow-up for Phone Call        has been taking coricidin. it has not helped. wanted to take different otcs. told her best to see a doctor. appt made for tomorrow pm. she has to arrange a ride. to increase fluid intake , continue the coricidin, rest. use humidifier. she agreed with plan Follow-up by: Golden Circle RN,  December 04, 2009 4:32 PM  Additional Follow-up for Phone Call Additional follow up Details #1::        Pt No showe for appt today Additional Follow-up by: Gladstone Pih,  December 05, 2009 4:19 PM

## 2011-01-10 NOTE — Miscellaneous (Signed)
Summary: RX Refill Form  Prescriptions: ACCU-CHEK COMPACT   STRP (GLUCOSE BLOOD) Use 4 times a day as directed Disp: QS x one month  #999 x 6   Entered and Authorized by:   Johney Maine MD   Signed by:   Johney Maine MD on 06/21/2008   Method used:   Print then Give to Patient   RxID:   [id number]  Pt dropped off RX refill form.  Please fax when completed. [facility] HARRELSON  June 20, 2008 4:42 PM  form to md for rx.Kennon Rounds Illya Gienger RN  June 20, 2008 5:04 PM faxed.Golden Circle RN  June 21, 2008 12:27 PM

## 2011-01-10 NOTE — Progress Notes (Signed)
Summary: triage  Phone Note Call from Patient Call back at Home Phone [phone]   Caller: Patient Summary of Call: had uti and has taken all meds and still not feeling well - burning w/ urination Initial call taken by: De Nurse,  March 30, 2009 9:12 AM  Follow-up for Phone Call        went to ED for UTI recently. still has some pain with urination. also feels burning in vaginal area. appt today at 1:30pm Follow-up by: Golden Circle RN,  March 30, 2009 9:21 AM

## 2011-01-10 NOTE — Progress Notes (Signed)
Summary: ??? about meds  Phone Note Call from Patient Call back at Home Phone [phone]   Caller: Patient Summary of Call: pt has ??? about her meds.  Please call at [phone] Initial call taken by: Rae Roam,  June 15, 2009 11:36 AM  Follow-up for Phone Call        Pt states she has been off her diovan-hct for  more than a week. she wants to have a different rx sent in before her appt monday. she has been having blurred vision,weakness and hedaches. advised pt that she should come in to be seen for a workin  appt. she wants to wait until monday. Advised she will need to be seen to make changed toher med's. she not take this med until she talke to the doctor on monday. Pt has [facility] for several appt's. will forward to MD for review. BP 145/88,150/78,115/80,120/80. Follow-up by: Alphia Kava,  June 15, 2009 11:46 AM  Additional Follow-up for Phone Call Additional follow up Details #1::        reviewed by Dr Mayford Knife. Pt will keep appt on monday Additional Follow-up by: Alphia Kava,  June 16, 2009 3:37 PM

## 2011-01-10 NOTE — Miscellaneous (Signed)
Summary: hospital admission and pt's phone numbers/ts  Clinical Lists Changes called for a tele bed and admission. they will call pt at home as soon as they have a bed. called the pt. pt said, that her husband will take her to the hospital as soon as they have a bed. pt's cell # [phone] Home # [phone].  orders will be faxed to the hospital admission # [phone]. Dr.Breen will write the orders .Marland KitchenArlyss Repress CMA,  February 03, 2009 5:07 PM Admission is aware of this.Arlyss Repress CMA,  February 03, 2009 5:08 PM

## 2011-01-10 NOTE — Consult Note (Signed)
Summary: [facility]   Imported By: De Nurse 02/21/2009 12:19:20  _____________________________________________________________________  External Attachment:    Type:   Image     Comment:   External Document

## 2011-01-10 NOTE — Miscellaneous (Signed)
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

## 2011-01-10 NOTE — Assessment & Plan Note (Signed)
Summary: f/up,tcb   Vital Signs:  Patient profile:   55 year old female Height:      66 inches Weight:      148 pounds BMI:     23.97 Temp:     98.3 degrees F oral Pulse rate:   86 / minute BP sitting:   173 / 87  (left arm) Cuff size:   regular  Vitals Entered By: Jimmy Footman, CMA (July 31, 2010 1:51 PM) CC: f/u chronic issues Is Patient Diabetic? No   Primary Care Provider:  Edd Arbour  CC:  f/u chronic issues.  History of Present Illness: 55 yo here for follow-up. Destiny Morton is here to discuss "chronic medical issues".  Destiny Morton has some nontraditional beleifs about her healthcare and thus is difficult to form therapeutic alliance with patient.  DIABETES:  does not take metformin because is causes her hypoglycemia.  Destiny Morton thinks 500 mg is "too strong"  and althuogh is not taking any of it, inquires if Destiny Morton can take half a tablet.  Destiny Morton states Destiny Morton is taking insuling as directed.  Destiny Morton does not bring a cbg log and does not tell me what her typical blood sugars are.  HYPERTENSION poorly controlled.  Says it is well controlled at home but does not tell me any readings.  Destiny Morton only takes metoprolol once a day.  States Destiny Morton cannot take Hctz due to some intolerance.    Hx of CVA:  patient beleives Destiny Morton never had a stroke and instead it was a side effect cause by cholesterol medication.  has not been takin any statin.   hld- see above  Current Medications (verified): 1)  Aspirin 81 Mg Tbec (Aspirin) .... Take 1 Tablet By Mouth Once A Day 2)  Humulin N 100 Unit/ml Susp (Insulin Isophane Human) .... 38 Units Subcutaneously in Am and 15 Units in Pm. Increase The Nightime Dose By 1 Unit Each Night Until Your Am Blood Sugar Is Less Than 130. 3)  Metoprolol Tartrate 50 Mg Tabs (Metoprolol Tartrate) .Marland Kitchen.. 1 Tab By Mouth Two Times A Day 4)  Norvasc 10 Mg Tabs (Amlodipine Besylate) .... Take 1 Tablet By Mouth Once A Day 5)  Bd Insulin Syringe Ultrafine 30g X 1/2" 0.5 Ml  Misc (Insulin Syringe-Needle U-100)  .... Inject Subcutaneously Two Times A Day As  Directed 6)  Accu-Chek Compact   Strp (Glucose Blood) .... Use 4 Times A Day As Directed Disp: Qs X One Month 7)  Metformin Hcl 500 Mg Tabs (Metformin Hcl) .Marland Kitchen.. 1 Tablet By Mouth Every Morning With Food  Allergies (verified): 1)  * Hyzaar 2)  Diovan PMH-FH-SH reviewed-no changes except otherwise noted  Review of Systems      See HPI  Physical Exam  General:  Well-developed,well-nourished,in no acute distress; alert,appropriate and cooperative throughout examination Lungs:  Normal respiratory effort, chest expands symmetrically. Lungs are clear to auscultation, no crackles or wheezes. Heart:  Normal rate and regular rhythm. S1 and S2 normal without gallop, murmur, click, rub or other extra sounds. Extremities:  no LE edema   Impression & Recommendations:  Problem # 1:  HYPERTENSION, BENIGN SYSTEMIC (ICD-401.1)  Poorly controlled.  patient states Destiny Morton will not take HCTZ any more so I have taken it off her medicatin list.  Will increase metoprolol to twice a day (Destiny Morton was taking 25 mg once a day although this is not what Destiny Morton was prescribed).  Will likely need additional agent for BP but Destiny Morton is not willing to address this at  this time.  Would benefit from ACE-I but Destiny Morton has concern that her husband or herself may have had an adverse effect.  Asked pt to bring BP cuff and BP log to next appt to meet new MD.  The following medications were removed from the medication list:    Hydrochlorothiazide 25 Mg Tabs (Hydrochlorothiazide) .Marland Kitchen... Take 1/2 tablet two times a day Her updated medication list for this problem includes:    Metoprolol Tartrate 50 Mg Tabs (Metoprolol tartrate) .Marland Kitchen... 1 tab by mouth two times a day    Norvasc 10 Mg Tabs (Amlodipine besylate) .Marland Kitchen... 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  Discussed that Destiny Morton may  benefit more if Destiny Morton take metformin as prescribed and instead, decrease amount of insulin used if having hypoglycemia.  Destiny Morton is open to this idea and will start taking Metformin 500 mg daily but Destiny Morton may choose to take only half a tablet daily.  Her updated medication list for this problem includes:    Aspirin 81 Mg Tbec (Aspirin) .Marland Kitchen... Take 1 tablet by mouth once a day    Humulin N 100 Unit/ml Susp (Insulin isophane human) .Marland KitchenMarland KitchenMarland KitchenMarland Kitchen 38 units subcutaneously in am and 15 units in pm. increase the nightime dose by 1 unit each night until your am blood sugar is less than 130.    Metformin Hcl 500 Mg Tabs (Metformin hcl) .Marland Kitchen... 1 tablet by mouth every morning with food  Labs Reviewed: Creat: 0.83 (04/26/2010)    Reviewed HgBA1c results: 12.5 (04/26/2010)  10.6 (08/04/2009)  Orders: [facility]- Est  Level 4 (24401)  Problem # 3:  HYPERLIPIDEMIA (ICD-272.4)  Patient LDL above goal espeically given risk factors of DM, Hx of CVA.  Destiny Morton has not been taking a statin and when questioned, Destiny Morton does not remember ever having myalgias or an intolerance.  I mentioned starting lipitor as it is goign generic and has a coupon card but patient says Destiny Morton heard it was recalled and did not want to start.  WIll discuss at next appt with PCP.  Labs Reviewed: SGOT: 16 (04/26/2010)   SGPT: 16 (04/26/2010)   HDL:42 (04/26/2010), 36 (04/11/2009)  LDL:139 (04/26/2010), 118 (04/11/2009)  Chol:192 (04/26/2010), 165 (04/11/2009)  Trig:55 (04/26/2010), 56 (04/11/2009)  Orders: [facility]- Est  Level 4 (99214)  Complete Medication List: 1)  Aspirin 81 Mg Tbec (Aspirin) .... Take 1 tablet by mouth once a day 2)  Humulin N 100 Unit/ml Susp (Insulin isophane human) .... 38 units subcutaneously in am and 15 units in pm. increase the nightime dose by 1 unit each night until your am blood sugar is less than 130. 3)  Metoprolol Tartrate 50 Mg Tabs (Metoprolol tartrate) .Marland Kitchen.. 1 tab by mouth two times a day 4)  Norvasc 10 Mg Tabs (Amlodipine besylate)  .... Take 1 tablet by mouth once a day 5)  Bd Insulin Syringe Ultrafine 30g X 1/2" 0.5 Ml Misc (Insulin syringe-needle u-100) .... Inject subcutaneously two times a day as  directed 6)  Accu-chek Compact Strp (Glucose blood) .... Use 4 times a day as directed disp: qs x one month 7)  Metformin Hcl 500 Mg Tabs (Metformin hcl) .Marland Kitchen.. 1 tablet by mouth every morning with food  Patient Instructions: 1)  Each of your medications is special and has been chosen just for you.  It is important you take these daily. 2)  Keep a  log of your blood pressures daily. 3)  Keep a log of your blood sugars 4)  bring all of this to your next apopintment 5)  Make follow-up appt in 3 weeks with Dr. Rivka Safer  Prescriptions: METOPROLOL TARTRATE 50 MG TABS (METOPROLOL TARTRATE) 1 tab by mouth two times a day  #60 x 1   Entered and Authorized by:   Delbert Harness MD   Signed by:   Delbert Harness MD on 08/01/2010   Method used:   [facility]        [facility] [address] [phone]* (retail)       [address]       [place], [place]  [address]       Ph: [phone] or [phone]       Fax: [phone]   RxID:   [id number]    Prevention & Chronic Care Immunizations   Influenza vaccine: Not documented   Influenza vaccine due: Refused  (12/14/2008)    Tetanus booster: 04/08/1993: Done.   Tetanus booster due: 04/09/2003    Pneumococcal vaccine: Not documented  Colorectal Screening   Hemoccult: Done.  (08/09/1997)   Hemoccult due: 08/09/1998    Colonoscopy: Not documented  Other Screening   Pap smear: NEGATIVE FOR INTRAEPITHELIAL LESIONS OR MALIGNANCY.  (04/11/2009)   Pap smear due: 03/09/2005    Mammogram: Done.  (12/09/2002)   Mammogram due: 12/10/2003   Smoking status: never  (04/26/2010)  Diabetes Mellitus   HgbA1C: 12.5  (04/26/2010)   Hemoglobin A1C due: 04/17/2008    Eye exam: Not documented   Diabetic eye exam action/deferral: Ophthalmology referral   (04/26/2010)    Foot exam: yes  (10/02/2007)   Foot exam action/deferral: Do today   High risk foot: Not documented   Foot care education: Not documented   Foot exam due: 10/01/2008    Urine microalbumin/creatinine ratio: Not documented    Diabetes flowsheet reviewed?: Yes   Progress toward A1C goal: Unchanged  Lipids   Total Cholesterol: 192  (04/26/2010)   LDL: 139  (04/26/2010)   LDL Direct: Not documented   HDL: 42  (04/26/2010)   Triglycerides: 55  (04/26/2010)    SGOT (AST): 16  (04/26/2010)   SGPT (ALT): 16  (04/26/2010)   Alkaline phosphatase: 103  (04/26/2010)   Total bilirubin: 0.7  (04/26/2010)    Lipid flowsheet reviewed?: Yes   Progress toward LDL goal: Unchanged  Hypertension   Last Blood Pressure: 173 / 87  (07/31/2010)   Serum creatinine: 0.83  (04/26/2010)   Serum potassium 3.7  (04/26/2010)    Hypertension flowsheet reviewed?: Yes   Progress toward BP goal: Unchanged  Self-Management Support :   Personal Goals (by the next clinic visit) :     Personal A1C goal: 8  (04/26/2010)     Personal blood pressure goal: 140/90  (04/26/2010)     Personal LDL goal: 100  (04/26/2010)    Patient will work on the following items until the next clinic visit to reach self-care goals:     Medications and monitoring: take my medicines every day, check my blood sugar, check my blood pressure  (07/31/2010)     Eating: drink diet soda or water instead of juice or soda, eat more vegetables, use fresh or frozen vegetables, eat foods that are low in salt, eat baked foods instead of fried foods, eat fruit for snacks and desserts  (04/26/2010)    Diabetes self-management support: Written self-care plan  (  07/31/2010)   Diabetes care plan printed    Hypertension self-management support: BP self-monitoring log, Education handout  (07/31/2010)   Hypertension education handout printed    Lipid self-management support: Written self-care plan  (04/26/2010)

## 2011-01-10 NOTE — Assessment & Plan Note (Signed)
Summary: dnka,ag      Current Allergies: * HYZAAR        Complete Medication List: 1)  Aspirin 81 Mg Tbec (Aspirin) .... Take 1 tablet by mouth once a day 2)  Humulin N 100 Unit/ml Susp (Insulin isophane human) .... 35 units subcutaneously in am and 17 units in pm. 3)  Metoprolol Succinate 25 Mg Tb24 (Metoprolol succinate) .... Take 1 tablet once a day 4)  Norvasc 10 Mg Tabs (Amlodipine besylate) .... Take 1 tablet by mouth once a day 5)  Bd Insulin Syringe Ultrafine 30g X 1/2" 0.5 Ml Misc (Insulin syringe-needle u-100) .... Inject subcutaneously two times a day as  directed 6)  Diovan Hct 160-25 Mg Tabs (Valsartan-hydrochlorothiazide) .Marland Kitchen.. 1 tab a day 7)  Accu-chek Compact Strp (Glucose blood) .... Use 4 times a day as directed disp: qs x one month 8)  Metronidazole 500 Mg Tabs (Metronidazole) .Marland Kitchen.. 1 tab by mouth two times a day for 7 days    ]

## 2011-01-10 NOTE — Letter (Signed)
Summary: Generic Letter  [name]  [address]   [place], [place] [address]   Phone: [phone]  Fax: [phone]    03/16/2010  Destiny Morton [address] [place], [place]  [address]  Dear Ms. Gullion,  You have missed several appointments over the past year.  This makes it difficult to treat your high blood pressure and diabetes.  It also goes against our clinic policy.  This letter is to inform you that if you miss 2 more appointments without calling the clinic 24 hours before your appointment, you may be dismissed from the practice.           Sincerely,   Tishie Altmann MD [name]  Appended Document: Generic Letter mailed letter to pt

## 2011-01-10 NOTE — Progress Notes (Signed)
Summary: Rx  Phone Note Refill Request Call back at Home Phone [phone]   pt needs to speak with RN re: her insulin needles, rx was sent wrg  Initial call taken by: Knox Royalty,  January 03, 2011 9:10 AM    Prescriptions: BD INSULIN SYRINGE ULTRAFINE 30G X 1/2" 0.5 ML  MISC (INSULIN SYRINGE-NEEDLE U-100) inject subcutaneously two times a day as  directed  #180 x 3   Entered by:   Theresia Lo RN   Authorized by:   Pearlean Brownie MD   Signed by:   Theresia Lo RN on 01/04/2011   Method used:   [facility] ...       [facility] [address] [phone]* (retail)       [address]       [place], [place]  [address]       Ph: [phone] or [phone]       Fax: [phone]   RxID:   [id number]  attempted call back to patient , no answer. Theresia Lo RN  January 03, 2011 11:30 AM  [facility] at [phone] at patient's request. the syringes she recieved by mail recently  were incorrect she states and she will be sending back. she received 1 ml syringes and she does not want these.  states this rx came from our office but it not listed in med list  recently.   did not have NPI listed for Dr. Rivka Safer so called in under Dr. Deirdre Priest' name. Theresia Lo RN  January 04, 2011 2:02 PM

## 2011-01-10 NOTE — Consult Note (Signed)
Summary: [facility]-stress test  [facility]   Imported By: De Nurse 03/22/2009 08:33:50  _____________________________________________________________________  External Attachment:    Type:   Image     Comment:   External Document

## 2011-01-10 NOTE — Progress Notes (Signed)
Summary: referral  Phone Note Call from Patient Call back at Home Phone [phone]   Caller: Patient Summary of Call: pls call in CT of the head, needs one w/ contrast - per tech had one on Friday and was told needed contrast cell- [phone] Initial call taken by: De Nurse,  February 06, 2009 9:11 AM  Follow-up for Phone Call        will forward to MD for approval Follow-up by: Dedra Skeens CMA,,  February 06, 2009 11:17 AM  Additional Follow-up for Phone Call Additional follow up Details #1::        Pt was seen on 02-03-09 and she was sent to [facility] for an MRI. she was told by someone that they could not read the MRI, and she would need a CT done. Pt does not know if this was a doctor or radiology tech. Contacted [facility] radiology department and spoke with technicians. No one has an order or recalls telling this pt they were unable to read report.  Additional Follow-up by: Alphia Kava,  February 06, 2009 11:18 AM    Additional Follow-up for Phone Call Additional follow up Details #2::    This was arranged by the inpatient team and our staff was to order an MRA WITH contrast of the headand neck.  There order is in the chart. NO CT IS NEEDED. MRA only..see order in chart.  Follow-up by: Johney Maine MD,  February 06, 2009 11:35 AM  Additional Follow-up for Phone Call Additional follow up Details #3:: Details for Additional Follow-up Action Taken: Per hospital document pt refused to have this with constrast. DId you want to call pt and see if she has changed her mind about this test? Additional Follow-up by: Alphia Kava,  February 06, 2009 11:58 AM  patient refused MRA as INPATIENT b/c she wanted to go home. At least that's what I was told. She promised to have it done as an outpatient.  You can call and confirm that that is the note I received.  Karn Pickler MD, Matvey Llanas February 06, 2009 12:04 PM    Pt notified. Appt 02-08-09 at 7 am at [facility]. she will check with insurance to see if this will be covered by  insurance. she will have this done. i will cancel the appt wih you.Marland KitchenMarland KitchenAlphia Kava  February 06, 2009 2:41 PM   Appended Document: referral Please have her reschedule for the following week to f/u MRA findings.  Johney Maine 02/06/09

## 2011-01-10 NOTE — Letter (Signed)
Summary: Results Follow-up Letter  [facility]  [address]   [place], [place] [address]   Phone: [phone]  Fax: [phone]    09/18/2008  [address] [place], [place]  [address]  Dear [name],   The following are the results of your recent test(s):  _________________________________________________________ Cholesterol LDL(Bad cholesterol):     578     Your goal is less than:    70     HDL (Good cholesterol):    42    Your goal is more than: 60 _________________________________________________________ Other Tests:  Your sugar was almost 300.  Please take your insulin.  I strongly recommend that you switch to Lantus.  Your kidneys are working well now but are at risk of damage if your sugars continue to run high.  _________________________________________________________  Please call for an appointment Or _________________________________________________________ _________________________________________________________ _________________________________________________________  Sincerely,  Destiny Maine MD [name]           Appended Document: Results Follow-up Letter Letter sent to pt via mail .DPGCMA

## 2011-01-10 NOTE — Letter (Signed)
Summary: Lipid Letter  [name]  [address]   [place], [place] [address]   Phone: [phone]  Fax: [phone]    04/16/2009  Marcelle Hepner [address] [place], [place]  [address]  Dear Destiny Morton:  We have carefully reviewed your last lipid profile from 04/11/2009 and the results are noted below with a summary of recommendations for lipid management.    Cholesterol:       165     Goal: < 180   HDL "good" Cholesterol:   36     Goal: > 50   LDL "bad" Cholesterol:   118     Goal: <100   Triglycerides:       56     Goal: <150    You still need to lower your LDL.  At your  next appointment we will talk about increasing your simvastatin dose.  Your other labs were normal.  Your pap smear was normal    TLC Diet (Therapeutic Lifestyle Change): Saturated Fats & Transfatty acids should be kept < 7% of total calories ***Reduce Saturated Fats Polyunstaurated Fat can be up to 10% of total calories Monounsaturated Fat Fat can be up to 20% of total calories Total Fat should be no greater than 25-35% of total calories Carbohydrates should be 50-60% of total calories Protein should be approximately 15% of total calories Fiber should be at least 20-30 grams a day ***Increased fiber may help lower LDL Total Cholesterol should be < 200mg /day Consider adding plant stanol/sterols to diet (example: Benacol spread) ***A higher intake of unsaturated fat may reduce Triglycerides and Increase HDL    Adjunctive Measures (may lower LIPIDS and reduce risk of Heart Attack) include: Aerobic Exercise (20-30 minutes 3-4 times a week) Limit Alcohol Consumption Weight Reduction Aspirin 75-81 mg a day by mouth (if not allergic or contraindicated) Dietary Fiber 20-30 grams a day by mouth     Current Medications: 1)    Aspirin 81 Mg Tbec (Aspirin) .... Take 1 tablet by mouth once a day 2)    Humulin N 100 Unit/ml Susp (Insulin isophane human) .... 38 units subcutaneously in am and 20 units  in pm. 3)    Metoprolol Succinate 25 Mg Tb24 (Metoprolol succinate) .... Take 1 tablet once a day 4)    Norvasc 10 Mg Tabs (Amlodipine besylate) .... Take 1 tablet by mouth once a day 5)    Bd Insulin Syringe Ultrafine 30g X 1/2" 0.5 Ml  Misc (Insulin syringe-needle u-100) .... Inject subcutaneously two times a day as  directed 6)    Diovan Hct 160-12.5 Mg Tabs (Valsartan-hydrochlorothiazide) .Marland Kitchen.. 1 tab by mouth daily 7)    Accu-chek Compact   Strp (Glucose blood) .... Use 4 times a day as directed disp: qs x one month 8)    Simvastatin 20 Mg Tabs (Simvastatin) .Marland Kitchen.. 1 tab by mouth at bedtime  If you have any questions, please call. We appreciate being able to work with you.   Sincerely,    [name] Johney Maine MD  Appended Document: Lipid Letter mailed

## 2011-01-10 NOTE — Progress Notes (Signed)
Summary: lab results  Phone Note Outgoing Call   Call placed by: Magnus Ivan MD,  May 03, 2010 2:40 PM Call placed to: Patient Details for Reason: inform pt of lab results Summary of Call: called patient to inform her of lab results.  concerned about high LDL as patient is a diabetic and also has a history of a CVA.  we would prefer her LDL to be below 100 and ideally 70.  call to pt and she did not have an answering machine.  will ask administrative team to call patient for an appointment so she can be put on a statin but seeing as she doesn't have an answering machine, i will mail a letter as well. Initial call taken by: Magnus Ivan MD,  May 03, 2010 2:42 PM

## 2011-01-10 NOTE — Assessment & Plan Note (Signed)
Summary: fu bp wp   Vital Signs:  Patient Profile:   55 Years Old Female Weight:      157.5 pounds Temp:     99.0 degrees F oral BP sitting:   181 / 97  (left arm)  Pt. in pain?   yes    Location:   pelvic area    Intensity:   7  Vitals Entered By: C.Mallory gtcc/sma                  PCP:  Johney Maine MD   History of Present Illness: cc: f/u HPI: 55 yo F w/ HTN, DM here for f/u 1)HTN:  Has been checking BP at home and says numbers are usually 130s on top.  Occasionaly 140.  Is upset now b/c she checked BP just before coming to clinic and was 138/88.  Shows me slip of paper she wrote number down on. Denies CP, SOB, HA.  Is taking a whole Hyzaar, Toprol, Norvasc.  Does not wany any more meds 2)DM:  Has refused Lantus in past.  Is willing to talk to Dr Raymondo Band about it.  Missed last Pharm appt b/c had to care for her grandson.  Currently takes 35 units in am and 22 in pm of Humulin.  States sugars in am are 189 range.   Never has signs of lows.  Admits to very poor eating habits but is trying to  eat better since her grandson is overweight as well. 3)vag discharge:  White discharge on occasion although has gotten better.   A few weeks ago had some pelvic pain with it but not resolved.   No burning, itching, fishy odor.   4)HLD:  Thinks she is taking  her statin but is not sure.    Past Medical History:    Reviewed history from 10/02/2007 and no changes required:        Recurrant BV and vaginitis  Past Surgical History:    spinal injections for siatica - 04/12/2004     Review of Systems      See HPI   Physical Exam  General:     Well-developed,well-nourished,in no acute distress; alert,appropriate and cooperative throughout examination Lungs:     Normal respiratory effort, chest expands symmetrically. Lungs are clear to auscultation, no crackles or wheezes. Heart:     Normal rate and regular rhythm. S1 and S2 normal without gallop, murmur, click, rub or other extra  sounds.    Impression & Recommendations:  Problem # 1:  HYPERTENSION, BENIGN SYSTEMIC (ICD-401.1) Assessment: Deteriorated Again, compliance is a concern atlhough pt states she is taking meds as directed.  She does bring in a better reading.  Could be some component of white coat htn but still needs better control.  May be able to switch Hyzaar and break up combo although given pt's reluctance to take pills, I am afraid adding an additional pill may not be beneficial.  Can increase metoprolol dose although this typically wouldn't lower BP by much.  Pt will write down BP logs at home and rtc 1 month .  Hopefuly pharm clinic can also help w/ BP regimine Her updated medication list for this problem includes:    Hyzaar 100-25 Mg Tabs (Losartan potassium-hctz) .Marland Kitchen... Take 1 tablet by mouth once a day    Metoprolol Succinate 25 Mg Tb24 (Metoprolol succinate) .Marland Kitchen... Take 1 tablet once a day    Norvasc 10 Mg Tabs (Amlodipine besylate) .Marland Kitchen... 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  Have attempted to educate pt on this in past.  Pt has refused any insulin changes in past and has been on same regimine for years, started by another provider.   Pt needs to be on a long acting insulin but has refused anything except what she is currently on.  Keeps saying she is willing to try once a day Lantus but keeps missing Pharm appts.  Will not try Lantus w/o seeing Dr Raymondo Band b/c she wants to know as much as possible about Lantus.  I can also switch her to NPH 70/30 two times a day which is longer acting but in past she has refused this as well.  Can try to bring it up again  if she declines Lantus.  Continue ASA, Hyzaar. Her updated medication list for this problem includes:    Aspirin 81 Mg Tbec (Aspirin) .Marland Kitchen... Take 1 tablet by mouth once a day    Humulin N 100 Unit/ml Susp (Insulin isophane human) .Marland KitchenMarland KitchenMarland KitchenMarland Kitchen 38 units subcutaneously in am and 30 units in pm.    Hyzaar 100-25 Mg Tabs (Losartan potassium-hctz) .Marland Kitchen... Take 1 tablet by mouth once a day  Orders: [facility] (08657) [facility]- Est  Level 4 (84696)  Labs Reviewed: HgBA1c: 10.8 (01/19/2008)   Creat: 0.85 (10/23/2007)      Problem # 3:  VAGINAL DISCHARGE (ICD-623.5) Assessment: New Neg wet prep.   Orders: Wet Prep- [facility] (87210) [facility]- Est  Level 4 (29528)   Problem # 4:  HYPERLIPIDEMIA (ICD-272.4) Assessment: Unchanged Encouraged pt to take statin.  Not at goal but overall pretty good FLP Her updated medication list for this problem includes:    Simvastatin 20 Mg Tabs (Simvastatin) .Marland Kitchen... Take 1 tablet by mouth at bedtime  Orders: [facility]- Est  Level 4 ([address])  Labs Reviewed: Chol: 158 (10/23/2007)   HDL: 38 (10/23/2007)   LDL: 104 (10/23/2007)   TG: 80 (10/23/2007) SGOT: 11 (10/23/2007)   SGPT: 12 (10/23/2007)   Complete Medication List: 1)  Aspirin 81 Mg Tbec (Aspirin) .... Take 1 tablet by mouth once a day 2)  Humulin N 100 Unit/ml Susp (Insulin isophane human) .... 38 units subcutaneously in am and 30 units in pm. 3)  Hyzaar 100-25 Mg Tabs (Losartan potassium-hctz) .... Take 1 tablet by mouth once a day 4)  Metoprolol Succinate 25 Mg Tb24 (Metoprolol succinate) .... Take 1 tablet once a day 5)  Norvasc 10 Mg Tabs (Amlodipine besylate) .... Take 1 tablet by mouth once a day 6)  Simvastatin 20 Mg Tabs (Simvastatin) .... Take 1 tablet by mouth at bedtime 7)  Bd Insulin Syringe Ultrafine 30g X 1/2" 0.5 Ml Misc (Insulin syringe-needle u-100) .... Inject subcutaneously two times a day as  directed 8)  Metronidazole 500 Mg Tabs (Metronidazole) .Marland Kitchen.. 1 tab by mouth two times a day for 7 days   Patient  Instructions: 1)  Please schedule a follow-up appointment in 1 month.\par 2)  See Dr Raymondo Band in Pharmacy clinic as soon as possible for Lantus. 3)  Very important to take all your medicines 4)  Stay away from salt and canned foods. 5)  Try to exercise  Prescriptions: HUMULIN N 100 UNIT/ML SUSP (INSULIN ISOPHANE HUMAN) 38 units subcutaneously in am and 30 units in pm.  #9999 x 1   Entered and Authorized by:   Johney Maine MD   Signed by:   Johney Maine MD on 01/19/2008   Method used:   Print then Give to Patient   RxID:   [id number]  no urine done Bonnie [place], [place] (ASCP)  Laboratory Results   Urine Tests  Date/Time Received:      Blood Tests   Date/Time Received: January 19, 2008 3:19  PM  Date/Time Reported: January 19, 2008 3:45 PM   HGBA1C: 10.8%   (Normal Range: Non-Diabetic - 3-6%   Control Diabetic - 6-8%)  Comments: ..........test performed by ...........Marland KitchenDelano Metz, Cytogeneticist ..........verified by ..........Marland KitchenBonnie A. [place], [place] (ASCP)3:45 PM  Date/Time Received: January 19, 2008 3:19  PM  Date/Time Reported: January 19, 2008 3:46 PM   Wet Mount/KOH Source: vag WBC/hpf: rare Bacteria/hpf: 3+  Rods Clue cells/hpf: none  Negative whiff Yeast/hpf: none Trichomonas/hpf: none Comments: few sperm ...............test performed by......Marland KitchenBonnie A. [place], [place] (ASCP)
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
## 2011-01-10 NOTE — Progress Notes (Signed)
Summary: Rx  Phone Note Call from Patient Call back at Home Phone [phone]   Summary of Call: Pt is wanting to speak with someone about rx form she states she dropped off about a month ago. Initial call taken by: Haydee Salter,  July 28, 2008 9:16 AM  Follow-up for Phone Call        spoke with patient . she states her mail order pharmacy never received rx. advised to please find out fax number and we will refax. she will call back with number. Follow-up by: Theresia Lo RN,  July 28, 2008 10:53 AM  Additional Follow-up for Phone Call Additional follow up Details #1::        fax to [facility] at [phone] Additional Follow-up by: Knox Royalty,  July 29, 2008 3:40 PM    Prescriptions: ACCU-CHEK COMPACT   STRP (GLUCOSE BLOOD) Use 4 times a day as directed Disp: QS x one month  #360 x 3   Entered by:   Theresia Lo RN   Authorized by:   Johney Maine MD   Signed by:   Theresia Lo RN on 08/01/2008   Method used:   Printed then faxed to ...         RxID:   [id number] ACCU-CHEK COMPACT   STRP (GLUCOSE BLOOD) Use 4 times a day as directed Disp: QS x one month  #120 strips x 6   Entered by:   Theresia Lo RN   Authorized by:   Johney Maine MD   Signed by:   Theresia Lo RN on 08/01/2008   Method used:   Printed then faxed to ...         RxID:   [id number]    Prescriptions: ACCU-CHEK COMPACT   STRP (GLUCOSE BLOOD) Use 4 times a day as directed Disp: QS x one month  #360 x 3   Entered by:   Theresia Lo RN   Authorized by:   Johney Maine MD   Signed by:   Theresia Lo RN on 08/01/2008   Method used:   Printed then faxed to ...         RxID:   [id number] ACCU-CHEK COMPACT   STRP (GLUCOSE BLOOD) Use 4 times a day as directed Disp: QS x one month  #120 strips x 6   Entered by:   Theresia Lo RN   Authorized by:   Johney Maine MD   Signed by:   Theresia Lo RN on 08/01/2008   Method used:   Printed then faxed to ...        RxID:   [id number]   faxed as requested. patient notified. patient requests 3 month supply so will resend. Theresia Lo RN  August 01, 2008 12:37 PM

## 2011-01-10 NOTE — Letter (Signed)
Summary: Generic Letter  [name]  [address]   [place], [place] [address]   Phone: [phone]  Fax: [phone]    08/07/2009  WILBA MUTZ [address] [facility] [address] [place], [place]  [address]  Dear Ms. Gernert,   Your urine test (culture) was normal.  Please call me if you have any questions.        Sincerely,   Asher Muir MD  Appended Document: Generic Letter mailed.

## 2011-01-10 NOTE — Progress Notes (Signed)
Summary: urine results  ---- Converted from flag ---- ---- 08/03/2010 7:28 AM, Delbert Harness MD wrote: please call pt and let her know urine test is normal= no uti.  thanks ------------------------------  Called patient and informed her that no UTI was present.

## 2011-01-25 ENCOUNTER — Other Ambulatory Visit: Payer: Self-pay | Admitting: Family Medicine

## 2011-01-25 DIAGNOSIS — I10 Essential (primary) hypertension: Secondary | ICD-10-CM

## 2011-01-30 ENCOUNTER — Ambulatory Visit (INDEPENDENT_AMBULATORY_CARE_PROVIDER_SITE_OTHER): Payer: Federal, State, Local not specified - PPO | Admitting: Family Medicine

## 2011-01-30 ENCOUNTER — Encounter: Payer: Self-pay | Admitting: Family Medicine

## 2011-01-30 VITALS — BP 160/90 | HR 76 | Temp 98.1°F | Ht 67.0 in | Wt 150.1 lb

## 2011-01-30 DIAGNOSIS — I10 Essential (primary) hypertension: Secondary | ICD-10-CM

## 2011-01-30 MED ORDER — AMLODIPINE BESYLATE 10 MG PO TABS: 10.0000 mg | ORAL_TABLET | Freq: Every day | ORAL | Status: DC

## 2011-01-30 MED ORDER — METOPROLOL TARTRATE 50 MG PO TABS: 50.0000 mg | ORAL_TABLET | Freq: Two times a day (BID) | ORAL | Status: DC

## 2011-01-30 NOTE — Progress Notes (Signed)
  Subjective:    Patient ID: Destiny Morton, female    DOB: October 11, 1956, 55 y.o.   MRN: [id number]  Hypertension This is a chronic problem. The current episode started more than 1 year ago. The problem has been gradually improving since onset. The problem is uncontrolled. Associated symptoms include anxiety. There are no associated agents to hypertension. Risk factors for coronary artery disease include diabetes mellitus, family history, post-menopausal state and stress. Past treatments include calcium channel blockers, beta blockers and lifestyle changes. The current treatment provides mild improvement. Compliance problems include psychosocial issues.       Review of Systems  Eyes: Positive for discharge.  Psychiatric/Behavioral: Positive for agitation. The patient is nervous/anxious.   All other systems reviewed and are negative.       Objective:   Physical Exam  Constitutional: She appears well-developed.  HENT:  Head: Normocephalic and atraumatic.  Cardiovascular: Normal rate and regular rhythm.   Pulmonary/Chest: Effort normal and breath sounds normal.  Neurological: She is alert.          Assessment & Plan:  Pt. Is a 55 y/o aaf with HTN and DM. She has been improving her blood pressure on metoprolol and amlodipine, and reports that at home her pressures are 140/80 or less and that she gets nervous at the clinic, which gives her a higher blood pressure. 1. Will continue with same doses for now. I rechecked her pressure after breathing exercises and it was 145/90. Still high for DM pt. I also gave her a blood pressure log for her to keep. She will follow up with me in 3 weeks. 2. Allergies - OTC claritin 3. Anxiety - she is a very high energy person, but does not appear anxious. I recommended breathing exercises, yoga, and meditation. She did not want any other medications.

## 2011-01-30 NOTE — Patient Instructions (Signed)
Please follow up in 3 monthes. Keep a diary of your BP

## 2011-02-18 LAB — POCT I-STAT, CHEM 8
BUN: 9 mg/dL (ref 6–23)
Calcium, Ion: 1.1 mmol/L — ABNORMAL LOW (ref 1.12–1.32)
Chloride: 105 mEq/L (ref 96–112)
Creatinine, Ser: 0.8 mg/dL (ref 0.4–1.2)
Glucose, Bld: 99 mg/dL (ref 70–99)
HCT: 45 % (ref 36.0–46.0)
Hemoglobin: 15.3 g/dL — ABNORMAL HIGH (ref 12.0–15.0)
Potassium: 3.5 mEq/L (ref 3.5–5.1)
Sodium: 139 mEq/L (ref 135–145)
TCO2: 25 mmol/L (ref 0–100)

## 2011-02-18 LAB — URINALYSIS, ROUTINE W REFLEX MICROSCOPIC
Bilirubin Urine: NEGATIVE
Glucose, UA: NEGATIVE mg/dL
Hgb urine dipstick: NEGATIVE
Ketones, ur: NEGATIVE mg/dL
Nitrite: NEGATIVE
Protein, ur: NEGATIVE mg/dL
Specific Gravity, Urine: 1.006 (ref 1.005–1.030)
Urobilinogen, UA: 0.2 mg/dL (ref 0.0–1.0)
pH: 7.5 (ref 5.0–8.0)

## 2011-02-18 LAB — GLUCOSE, CAPILLARY: Glucose-Capillary: 108 mg/dL — ABNORMAL HIGH (ref 70–99)

## 2011-03-09 ENCOUNTER — Other Ambulatory Visit: Payer: Self-pay | Admitting: Family Medicine

## 2011-03-09 NOTE — Telephone Encounter (Signed)
Refill request

## 2011-03-21 ENCOUNTER — Telehealth: Payer: Self-pay | Admitting: Family Medicine

## 2011-03-21 LAB — URINALYSIS, ROUTINE W REFLEX MICROSCOPIC
Bilirubin Urine: NEGATIVE
Glucose, UA: 100 mg/dL — AB
Ketones, ur: NEGATIVE mg/dL
Nitrite: NEGATIVE
Protein, ur: NEGATIVE mg/dL
Specific Gravity, Urine: 1.004 — ABNORMAL LOW (ref 1.005–1.030)
Urobilinogen, UA: 0.2 mg/dL (ref 0.0–1.0)
pH: 7 (ref 5.0–8.0)

## 2011-03-21 LAB — URINE MICROSCOPIC-ADD ON

## 2011-03-21 NOTE — Telephone Encounter (Signed)
Patient reports abdominal pain on right side which extends across  mid abdomen at waist line. She has been bloated for past 2 days.  She feels there is a knot there and it is painful to bend over. When she is sitting does not notice pain . Has been passing a lot of gas and has taken Gas X . Last BM was 4 days ago and was harder than usual. She has just taken laxative today. . No fever, nausea or vomiting. Appointment scheduled tomorrow for evaluation.

## 2011-03-21 NOTE — Telephone Encounter (Signed)
Has a lot of gas built up and is hurting her stomach.  Wants to know what she can take

## 2011-03-22 ENCOUNTER — Ambulatory Visit: Payer: Federal, State, Local not specified - PPO | Admitting: Family Medicine

## 2011-03-26 LAB — BASIC METABOLIC PANEL
BUN: 9 mg/dL (ref 6–23)
CO2: 25 mEq/L (ref 19–32)
Calcium: 8.9 mg/dL (ref 8.4–10.5)
Chloride: 98 mEq/L (ref 96–112)
Creatinine, Ser: 0.66 mg/dL (ref 0.4–1.2)
GFR calc Af Amer: >60 mL/min (ref >60)
GFR calc non Af Amer: >60 mL/min (ref >60)
Glucose, Bld: 318 mg/dL — ABNORMAL HIGH (ref 70–99)
Potassium: 3.4 mEq/L — ABNORMAL LOW (ref 3.5–5.1)
Sodium: 131 mEq/L — ABNORMAL LOW (ref 135–145)

## 2011-03-26 LAB — HEMOGLOBIN A1C
Hgb A1c MFr Bld: 10.7 % — ABNORMAL HIGH (ref 4.6–6.1)
Mean Plasma Glucose: 260 mg/dL

## 2011-03-26 LAB — GLUCOSE, CAPILLARY
Glucose-Capillary: 238 mg/dL — ABNORMAL HIGH (ref 70–99)
Glucose-Capillary: 336 mg/dL — ABNORMAL HIGH (ref 70–99)
Glucose-Capillary: 351 mg/dL — ABNORMAL HIGH (ref 70–99)
Glucose-Capillary: 371 mg/dL — ABNORMAL HIGH (ref 70–99)

## 2011-03-26 LAB — LIPID PANEL
Cholesterol: 183 mg/dL (ref 0–200)
HDL: 39 mg/dL — ABNORMAL LOW (ref >39)
LDL Cholesterol: 137 mg/dL — ABNORMAL HIGH (ref 0–99)
Total CHOL/HDL Ratio: 4.7 RATIO
Triglycerides: 36 mg/dL (ref <150)
VLDL: 7 mg/dL (ref 0–40)

## 2011-03-26 LAB — CBC
HCT: 42 % (ref 36.0–46.0)
Hemoglobin: 14 g/dL (ref 12.0–15.0)
MCHC: 33.4 g/dL (ref 30.0–36.0)
MCV: 86.4 fL (ref 78.0–100.0)
Platelets: 212 10*3/uL (ref 150–400)
RBC: 4.85 MIL/uL (ref 3.87–5.11)
RDW: 13.8 % (ref 11.5–15.5)
WBC: 5.1 10*3/uL (ref 4.0–10.5)

## 2011-03-26 LAB — PROTIME-INR
INR: 1 (ref 0.00–1.49)
Prothrombin Time: 13.4 seconds (ref 11.6–15.2)

## 2011-03-26 LAB — APTT: aPTT: 28 seconds (ref 24–37)

## 2011-03-28 ENCOUNTER — Emergency Department (HOSPITAL_COMMUNITY)
Admission: EM | Admit: 2011-03-28 | Discharge: 2011-03-28 | Disposition: A | Discharge status: Home / Self Care | Payer: Federal, State, Local not specified - PPO | Attending: Emergency Medicine | Admitting: Emergency Medicine

## 2011-03-28 DIAGNOSIS — M549 Dorsalgia, unspecified: Secondary | ICD-10-CM | POA: Insufficient documentation

## 2011-03-28 DIAGNOSIS — I10 Essential (primary) hypertension: Secondary | ICD-10-CM | POA: Insufficient documentation

## 2011-03-28 DIAGNOSIS — E119 Type 2 diabetes mellitus without complications: Secondary | ICD-10-CM | POA: Insufficient documentation

## 2011-03-28 DIAGNOSIS — B9689 Other specified bacterial agents as the cause of diseases classified elsewhere: Secondary | ICD-10-CM | POA: Insufficient documentation

## 2011-03-28 DIAGNOSIS — R1031 Right lower quadrant pain: Secondary | ICD-10-CM | POA: Insufficient documentation

## 2011-03-28 DIAGNOSIS — A499 Bacterial infection, unspecified: Secondary | ICD-10-CM | POA: Insufficient documentation

## 2011-03-28 DIAGNOSIS — N760 Acute vaginitis: Secondary | ICD-10-CM | POA: Insufficient documentation

## 2011-03-28 DIAGNOSIS — R142 Eructation: Secondary | ICD-10-CM | POA: Insufficient documentation

## 2011-03-28 DIAGNOSIS — Z794 Long term (current) use of insulin: Secondary | ICD-10-CM | POA: Insufficient documentation

## 2011-03-28 DIAGNOSIS — K5900 Constipation, unspecified: Secondary | ICD-10-CM | POA: Insufficient documentation

## 2011-03-28 DIAGNOSIS — R141 Gas pain: Secondary | ICD-10-CM | POA: Insufficient documentation

## 2011-03-28 DIAGNOSIS — R143 Flatulence: Secondary | ICD-10-CM | POA: Insufficient documentation

## 2011-03-28 LAB — URINALYSIS, ROUTINE W REFLEX MICROSCOPIC
Bilirubin Urine: NEGATIVE
Glucose, UA: 250 mg/dL — AB
Hgb urine dipstick: NEGATIVE
Ketones, ur: NEGATIVE mg/dL
Nitrite: NEGATIVE
Protein, ur: NEGATIVE mg/dL
Specific Gravity, Urine: 1.01 (ref 1.005–1.030)
Urobilinogen, UA: 0.2 mg/dL (ref 0.0–1.0)
pH: 7.5 (ref 5.0–8.0)

## 2011-03-28 LAB — WET PREP, GENITAL
Trich, Wet Prep: NONE SEEN
Yeast Wet Prep HPF POC: NONE SEEN

## 2011-03-29 LAB — GC/CHLAMYDIA PROBE AMP, GENITAL
Chlamydia, DNA Probe: NEGATIVE
GC Probe Amp, Genital: NEGATIVE

## 2011-04-23 NOTE — Discharge Summary (Signed)
Destiny Morton, SPURGIN NO.:  [id number]   MEDICAL RECORD NO.:  [id number]          PATIENT TYPE:  INP   LOCATION:  3729                         FACILITY:  [facility]   PHYSICIAN:  Wayne A. Sheffield Slider, M.D.    DATE OF BIRTH:  1956/08/30   DATE OF ADMISSION:  02/03/2009  DATE OF DISCHARGE:  02/04/2009                               DISCHARGE SUMMARY   PRIMARY CARE PHYSICIAN:  Destiny Maine, MD, [facility]   CONSULTATIONS:  None.   DISCHARGE DIAGNOSES:  1. 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  2. Uncontrolled diabetes with an A1c of 10.7.  3. Uncontrolled hyperlipidemia with LDL of 137.  4. Hypertension.   DISCHARGE MEDICATIONS:  1. Norvasc 10 mg p.o. daily.  2. Aspirin 81 mg p.o. daily.  3. Metoprolol XL 25 mg p.o. daily.  4. Diovan HCT 160/12.5 mg 1 p.o. daily.  5. Humulin 38 units subcu in the morning and 20 units in the p.m.   REASON FOR HOSPITALIZATION:  Please see dictated H&P for further  details.  Briefly, this is a 55 year old African American female with  uncontrolled diabetes, hypertension, hyperlipidemia, and some  noncompliance issues who presented to clinic with 2-day history of right-  sided weakness and some word-finding difficulty.  She was resistant, but  was finally convinced to be admitted to the hospital for workup of  likely CVA.   HOSPITAL COURSE:  A 55 year old African American female with  uncontrolled diabetes, hypertension, hyperlipidemia, and a 2-day history  of right-sided weakness, and word finding difficulty, found to have  multiple watershed acute left hemispheric infarcts due to tandem  stenosis of supraclinoid LICA on MRI without contrast on the day of  admission.  Unfortunately, the patient has some trust issues with  physicians and medications along with probably some fairly  significant  anxiety and declined any IV contrast for the MRI, so MRA was unable to  be completed.  Findings of the MRI were discussed with the patient and  she was quite adamant that she did not want further workup over the  weekend in the hospital and wanted to go home against medical advice.  Both Dr. Sheffield Slider and I spent a significant amount of time talking with the  patient about the fact that without the MRA and further workup, we could  not give her good etiology exactly for why the strokes happened,  although there is some concern for significant flow issues from the left-  sided carotid.  We also explained to her that we could not guarantee  that she would not have much larger and more compromising CVA any time  in the imminent future.  She was still quite adamant that she wanted to  go home today and complete workup as an outpatient next week through her  PCP whom I think she has a little bit better relationship with.  Her A1c  was 10.7 and her LDL was 137, so obviously neither  one of these were  controlled and this was also discussed with her at length.  She declined  any medication changes at discharge including beginning of statin or  Lantus or any other anticoagulation.  She was not taking aspirin  regularly and did agree to take 81 mg of aspirin daily.  We discussed  Lantus at length and the patient may be agreeable to starting this with  her primary care physician, but would like to discuss that with Dr.  Karn Pickler as an outpatient next week.  She declined a 2-D echo or carotid  Dopplers over the weekend as well.  The rest of her laboratory values  were stable.  By discharge, the patient's neuro exam was completely  normal except some very mild subjective balance issues, particularly  with heel walking, but otherwise her neurologic exam was nonfocal.  She  was not having word-finding difficulty and she has equal strength and  normal reflexes bilaterally.   IMAGING AND LABORATORY  DATA:  MRI of the head showed multiple watershed  type acute left hemispheric infarct due to tandem stenosis of the  supraclinoid LICA, but was an incomplete study due to the patient  refusing dye.   A1c 10.7.  Total cholesterol 183, triglycerides 36, HDL 39, and LDL 137.  BMET was within normal limits including a creatinine of 0.66 and INR was  1.0.  White blood cell 5.1, hemoglobin 14.0, and platelets 212.   FOLLOW UP ISSUES:  1. An MRI has been ordered and a flag sent to the nurses at [facility] to set the patient up for a stat MRI of the head on      Monday.  This is to better quantify the likely stenosis of the left      carotid artery.  2. The patient knows to contact the family practice center on Monday      morning to get an appoint with Dr. Karn Pickler who is in clinic Tuesday      morning.  I have also flagged the nurses to ask that they ensure      that the patient does call and get through that appointment.  3. Diabetic follow up.  As her A1c is 10.7, we would recommend to the      primary to consider starting Lantus, which the patient may be      agreeable to at this point.  4. Hyperlipidemia.  The patient very much needs to be on a statin      given the likely stenosis of the left carotid along with her LDL      that is not currently a goal and she may be agreeable to this.  5. Blood pressure control.  We were not aggressive with this during      the hospitalization given the watershed infarcts, but the patient      would not change on any of her home medicines and this will need to      be followed closely.      [name], M.D.  Electronically Signed      Wayne A. Sheffield Slider, M.D.  Electronically Signed    KS/MEDQ  D:  02/04/2009  T:  02/04/2009  Job:  [id number]

## 2011-07-20 ENCOUNTER — Other Ambulatory Visit: Payer: Self-pay | Admitting: Family Medicine

## 2011-07-21 NOTE — Telephone Encounter (Signed)
Refill request

## 2011-07-23 ENCOUNTER — Other Ambulatory Visit: Payer: Self-pay | Admitting: Family Medicine

## 2011-07-24 NOTE — Telephone Encounter (Signed)
Refill request

## 2011-08-05 ENCOUNTER — Ambulatory Visit (INDEPENDENT_AMBULATORY_CARE_PROVIDER_SITE_OTHER): Payer: Federal, State, Local not specified - PPO | Admitting: Family Medicine

## 2011-08-05 ENCOUNTER — Telehealth: Payer: Self-pay | Admitting: *Deleted

## 2011-08-05 ENCOUNTER — Encounter: Payer: Self-pay | Admitting: Family Medicine

## 2011-08-05 VITALS — BP 168/99 | HR 81 | Temp 97.6°F | Ht 67.0 in | Wt 148.6 lb

## 2011-08-05 DIAGNOSIS — N760 Acute vaginitis: Secondary | ICD-10-CM

## 2011-08-05 DIAGNOSIS — N898 Other specified noninflammatory disorders of vagina: Secondary | ICD-10-CM | POA: Insufficient documentation

## 2011-08-05 LAB — POCT URINALYSIS DIPSTICK
Bilirubin, UA: NEGATIVE
Blood, UA: NEGATIVE
Glucose, UA: 500
Ketones, UA: NEGATIVE
Leukocytes, UA: NEGATIVE
Nitrite, UA: NEGATIVE
Protein, UA: NEGATIVE
Spec Grav, UA: 1.02
Urobilinogen, UA: 0.2
pH, UA: 6.5

## 2011-08-05 LAB — POCT WET PREP (WET MOUNT)
Clue Cells Wet Prep HPF POC: NEGATIVE
Trichomonas Wet Prep HPF POC: NEGATIVE
Yeast Wet Prep HPF POC: NEGATIVE

## 2011-08-05 NOTE — Telephone Encounter (Signed)
Message copied by Farrell Ours on Mon Aug 05, 2011  2:44 PM ------      Message from: Macy Mis      Created: Mon Aug 05, 2011  2:40 PM       Not evidence of urinary tract infection or vaginitis.  Please inform patient [phone], [phone]

## 2011-08-05 NOTE — Telephone Encounter (Signed)
Spoke with patient and informed her of below 

## 2011-08-05 NOTE — Progress Notes (Signed)
  Subjective:    Patient ID: Destiny Morton, female    DOB: [birth year], 55 y.o.   MRN: [id number]  HPI Several days of vaginal discharge.  No vaginal bleeding.  No itching or odor.  No dysuria or abdominal pain.  Would like check for yeast or BV, and urinalysis.  Declines need for GC/CHlamydia.  States she got one tested recently and is married in a monogamous relationship.   Review of Systems See HPI       Objective:   Physical Exam GEN: NAD Pelvic Exam:        External: normal female genitalia without lesions or masses        Vagina: normal without lesions or masses        Cervix: normal without lesions or masses        Samples for Wet prep        Assessment & Plan:

## 2011-08-05 NOTE — Assessment & Plan Note (Signed)
No infectious vaginitis.  She declines need for Gc/Chlamydia.  Possibly physiologic vs atrophic.  No Rx at this time.

## 2011-08-05 NOTE — Patient Instructions (Signed)
Today was a visit just for your female problem- I will give you a call with results Talk to the front desk about scheduling you with a female resident to follow-up with your blood pressure and diabetes which is very important!

## 2011-09-12 ENCOUNTER — Telehealth: Payer: Self-pay | Admitting: Family Medicine

## 2011-09-12 MED ORDER — ACETONE (URINE) TEST VI STRP: 1.0000 | ORAL_STRIP | Status: DC | PRN

## 2011-09-12 MED ORDER — ACCU-CHEK SOFT TOUCH LANCETS MISC: Status: DC

## 2011-09-12 NOTE — Telephone Encounter (Signed)
Is needing Accu check soft clix lancets [facility] to [facility] [address].  Also Ketone Test Strips for Urinalysis and she would like thos as well.

## 2011-11-23 ENCOUNTER — Other Ambulatory Visit: Payer: Self-pay | Admitting: Family Medicine

## 2011-11-25 ENCOUNTER — Telehealth: Payer: Self-pay | Admitting: Family Medicine

## 2011-11-25 NOTE — Telephone Encounter (Signed)
Refill request

## 2011-11-25 NOTE — Telephone Encounter (Signed)
Pt states that she is no longer taking two pills of the metoprolol because it made her dizzy and caused her to not feel well.  She is taking 1.5 pills daily but her pressure is up in the 160's per patient.  Will forward to MD  [place], Maryjo [place]

## 2011-11-25 NOTE — Telephone Encounter (Signed)
Would like to speak to Dr. Rivka Safer about her Blood Pressure.  She is concerned and wants to know if she should change her dosage.  She does have an appt around the beginning of January which is the soonest he has.

## 2011-11-26 MED ORDER — HYDROCHLOROTHIAZIDE 12.5 MG PO CAPS: 12.5000 mg | ORAL_CAPSULE | Freq: Every day | ORAL | Status: DC

## 2011-11-26 NOTE — Telephone Encounter (Signed)
Sent in HCTZ for patient. She should also be taking Amlodipine. Please notify patient of changes.

## 2011-11-29 NOTE — Telephone Encounter (Signed)
lvm to inform pt of changes to meds. Told her to call back if there are any questions.Destiny Morton [place]

## 2011-12-09 ENCOUNTER — Other Ambulatory Visit: Payer: Self-pay | Admitting: Family Medicine

## 2011-12-09 NOTE — Telephone Encounter (Signed)
Refill request

## 2011-12-13 ENCOUNTER — Ambulatory Visit (INDEPENDENT_AMBULATORY_CARE_PROVIDER_SITE_OTHER): Payer: Federal, State, Local not specified - PPO | Admitting: Family Medicine

## 2011-12-13 DIAGNOSIS — E1165 Type 2 diabetes mellitus with hyperglycemia: Secondary | ICD-10-CM

## 2011-12-13 DIAGNOSIS — R300 Dysuria: Secondary | ICD-10-CM

## 2011-12-13 DIAGNOSIS — IMO0002 Reserved for concepts with insufficient information to code with codable children: Secondary | ICD-10-CM

## 2011-12-13 DIAGNOSIS — N760 Acute vaginitis: Secondary | ICD-10-CM

## 2011-12-13 LAB — POCT URINALYSIS DIPSTICK
Bilirubin, UA: NEGATIVE
Blood, UA: NEGATIVE
Glucose, UA: 500
Ketones, UA: NEGATIVE
Leukocytes, UA: NEGATIVE
Nitrite, UA: NEGATIVE
Protein, UA: NEGATIVE
Spec Grav, UA: 1.01
Urobilinogen, UA: 0.2
pH, UA: 6

## 2011-12-13 LAB — POCT WET PREP (WET MOUNT)
Clue Cells Wet Prep HPF POC: NEGATIVE
Trichomonas Wet Prep HPF POC: NEGATIVE
Yeast Wet Prep HPF POC: NEGATIVE

## 2011-12-13 LAB — POCT GLYCOSYLATED HEMOGLOBIN (HGB A1C): Hemoglobin A1C: 12.1

## 2011-12-13 MED ORDER — INSULIN GLARGINE 100 UNIT/ML ~~LOC~~ SOLN: 30.0000 [IU] | Freq: Every day | SUBCUTANEOUS | Status: DC

## 2011-12-13 MED ORDER — METFORMIN HCL 1000 MG PO TABS: 1000.0000 mg | ORAL_TABLET | Freq: Two times a day (BID) | ORAL | Status: DC

## 2011-12-13 NOTE — Progress Notes (Signed)
  Subjective:    Patient ID: Destiny Morton, female    DOB: [birth year], 56 y.o.   MRN: [id number]  HPI 1. Frequency, vaginitis. Negative wet prep and U/a. Patient has poorly controlled diabetes. Likely secondary to hyperglycemic diuresis.  No abdominal pain or flank pain. No vaginal discharge or bleeding. No fever, chills, night sweats.  2. Diabetes Uncontrolled. Lab Results  Component Value Date   HGBA1C 12.1 12/13/2011  not compliant with diet. She is only on long acting insulin in the morning and evening.  She refuses to start insulin with meals. No visual symptoms, no nerve pain, numbness, foot wounds, recurrent infections.   Review of Systems See hpi    Objective:   Physical Exam There were no vitals filed for this visit. Lungs:  Normal respiratory effort, chest expands symmetrically. Lungs are clear to auscultation, no crackles or wheezes. Heart - Regular rate and rhythm.  No murmurs, gallops or rubs.    Extremities:  No cyanosis, edema, or deformity noted with good range of motion of all major joints.   Fundi: normal Feet: no wounds   Assessment & Plan:

## 2011-12-13 NOTE — Patient Instructions (Signed)
It was great to see you today!  Schedule an appointment to see me in one week.  You do not have a vaginal or urine infection.  Your diabetes and blood pressure are not well controlled.  We will work together over the next few months to correct this.

## 2011-12-16 ENCOUNTER — Encounter: Payer: Self-pay | Admitting: Family Medicine

## 2011-12-16 NOTE — Assessment & Plan Note (Signed)
Starting Lantus Daily. Adding metformin. stoppping humulin, Refuses to start meal time insulin.  Advised that to get her blood sugars under control she will likely need insulin with meals. She says she will think about it.

## 2011-12-19 ENCOUNTER — Other Ambulatory Visit: Payer: Self-pay | Admitting: Family Medicine

## 2011-12-19 NOTE — Telephone Encounter (Signed)
Refill request

## 2012-02-11 ENCOUNTER — Other Ambulatory Visit: Payer: Self-pay | Admitting: Family Medicine

## 2012-02-14 ENCOUNTER — Ambulatory Visit (INDEPENDENT_AMBULATORY_CARE_PROVIDER_SITE_OTHER): Payer: Federal, State, Local not specified - PPO | Admitting: Family Medicine

## 2012-02-14 ENCOUNTER — Encounter: Payer: Self-pay | Admitting: Family Medicine

## 2012-02-14 VITALS — BP 168/86 | HR 94 | Temp 97.7°F | Ht 67.0 in | Wt 149.0 lb

## 2012-02-14 DIAGNOSIS — E785 Hyperlipidemia, unspecified: Secondary | ICD-10-CM

## 2012-02-14 DIAGNOSIS — R109 Unspecified abdominal pain: Secondary | ICD-10-CM | POA: Insufficient documentation

## 2012-02-14 DIAGNOSIS — I10 Essential (primary) hypertension: Secondary | ICD-10-CM

## 2012-02-14 LAB — CBC WITH DIFFERENTIAL/PLATELET
Basophils Absolute: 0 10*3/uL (ref 0.0–0.1)
Basophils Relative: 0 % (ref 0–1)
Eosinophils Absolute: 0.1 10*3/uL (ref 0.0–0.7)
Eosinophils Relative: 1 % (ref 0–5)
HCT: 42.1 % (ref 36.0–46.0)
Hemoglobin: 13.5 g/dL (ref 12.0–15.0)
Lymphocytes Relative: 32 % (ref 12–46)
Lymphs Abs: 2.2 10*3/uL (ref 0.7–4.0)
MCH: 26.9 pg (ref 26.0–34.0)
MCHC: 32.1 g/dL (ref 30.0–36.0)
MCV: 83.9 fL (ref 78.0–100.0)
Monocytes Absolute: 0.7 10*3/uL (ref 0.1–1.0)
Monocytes Relative: 11 % (ref 3–12)
Neutro Abs: 3.7 10*3/uL (ref 1.7–7.7)
Neutrophils Relative %: 56 % (ref 43–77)
Platelets: 309 10*3/uL (ref 150–400)
RBC: 5.02 MIL/uL (ref 3.87–5.11)
RDW: 13 % (ref 11.5–15.5)
WBC: 6.6 10*3/uL (ref 4.0–10.5)

## 2012-02-14 LAB — BASIC METABOLIC PANEL
BUN: 12 mg/dL (ref 6–23)
CO2: 27 mEq/L (ref 19–32)
Calcium: 9.4 mg/dL (ref 8.4–10.5)
Chloride: 101 mEq/L (ref 96–112)
Creat: 0.77 mg/dL (ref 0.50–1.10)
Glucose, Bld: 243 mg/dL — ABNORMAL HIGH (ref 70–99)
Potassium: 3.8 mEq/L (ref 3.5–5.3)
Sodium: 137 mEq/L (ref 135–145)

## 2012-02-14 LAB — POCT URINALYSIS DIPSTICK
Bilirubin, UA: NEGATIVE
Blood, UA: NEGATIVE
Glucose, UA: 500
Ketones, UA: NEGATIVE
Leukocytes, UA: NEGATIVE
Nitrite, UA: NEGATIVE
Protein, UA: NEGATIVE
Spec Grav, UA: 1.02
Urobilinogen, UA: 0.2
pH, UA: 6.5

## 2012-02-14 MED ORDER — TRAMADOL HCL 50 MG PO TABS: 50.0000 mg | ORAL_TABLET | Freq: Three times a day (TID) | ORAL | Status: AC | PRN

## 2012-02-14 NOTE — Assessment & Plan Note (Addendum)
Physical exam findings are concerning for urinary tract pathology (nephrolithiasis) or musculoskeletal origin. Will get abdominal X-ray.  Will get CBC and BMP today.  Tramadol as needed for pain.  In light of exam and description of pain that is worse with movement and not acute at rest, I am more suspicious of MSK condition as the cause.  Am hesistant to recommend around-the-clock NSAID use to this patient with poorly controlled HTN.  Her UA today is negative for blood, however up to 20% of patients with nephrolithiasis may have heme-negative UAs.  WIll get KUB despite the low likelihood of stones.  Tramadol for pain at this time.  Close follow up with primary physician if not improved.

## 2012-02-14 NOTE — Assessment & Plan Note (Signed)
Patient to follow with primary physician for assessment/management of elevated cholesterol.  Last LDL in 2011 according to our records was above goal.

## 2012-02-14 NOTE — Assessment & Plan Note (Signed)
Patient has had difficult to control HTN; she reports some intolerance to ARB medications, is unclear if she has ever had true angioedema or urticaria with ACEI/ARBs.  Her BP is not well controlled on Norvasc 10mg  daily and HCTZ 12.5mg  daily.  I am ordering labs to include renal function and CBC today, as these were last done over 1 year ago.  Would consider whether she has a true contraindication to another trial of ACEI/ARB, will leave this to her primary care physicain to address.

## 2012-02-14 NOTE — Patient Instructions (Signed)
It was a pleasure to see you today.   The cause of your left side pain is not entirely clear.  Our leading diagnosis is that it is muscular in origin, however kidney stones can often give patients pain in the region where you are experiencing it.   We would like to get an xray of your abdomen to look for a possible kidney stone.   Tramadol 50mg  tablets, one tablet by mouth every 6 hours as needed for pain.   We are sending bloodwork on you today, and ask that you follow up with Dr Rivka Safer for management of your diabetes and blood pressure.

## 2012-02-14 NOTE — Progress Notes (Addendum)
  Subjective:    Patient ID: Destiny Morton, female    DOB: [birth year], 56 y.o.   MRN: [id number]  HPI Destiny Morton is a 55 yo F with PMHx of uncontrolled htn and type 2 diabetes who presents to PCP for 1 week of left flank pain.  Pt describes the pain as sharp and throbbing and says that it runs from her side down her back and into her inguinal area.  Pt reports that she could have strained a muscle lifting a heavy case of bottled water last week.  She reports that the pain is worse when she moves or changes positions.  She says that tylenol makes the pain better.  Denies any changes in BM and LBM was last night.  Pt reports that she has not been sick recently, denies med changes, denies changes in diet or exercise.  Denies symptoms of dysuria, polyuria, incontinence.  Pain does not seem to be associated with eating or urinating.  Pt reports that she has never had kidney stones.  Denies fever, chills, n/v/d, rash. Patient seen with MS3 Gerlene Fee; patient reports just over 1 week of L sided flank pain, worse with changes in position.  Seen in [facility] about 1 week ago, had UA done and told not indicative of infection but treated with med that "changed my urine orange" (presumed to be pyridium).  Did not affect the pain.  Denies fevers/chills, does state that she does not completely empty bladder with void ("can force some more urine out after I use the bathroom".)  Does not lose control of urine or bowel.  Rest of history as documented above. JB  Review of Systems  All other systems reviewed and are negative.       Objective:   Physical Exam  Constitutional: She appears well-developed and well-nourished. No distress.  Neck: Normal range of motion. Neck supple. No thyromegaly present.  Cardiovascular: Normal rate, regular rhythm, normal heart sounds and intact distal pulses.  Exam reveals no gallop and no friction rub.   No murmur heard. Pulmonary/Chest: Effort normal and breath sounds  normal. No respiratory distress. She has no wheezes. She has no rales. She exhibits no tenderness.  Abdominal: Soft. Bowel sounds are normal. She exhibits no distension, no ascites and no mass. There is no hepatosplenomegaly. There is tenderness in the suprapubic area and left lower quadrant. There is no rebound, no guarding, no CVA tenderness and negative Murphy's sign.  Musculoskeletal:       No spinal tenderness.  Negative straight leg test.  Lymphadenopathy:    She has no cervical adenopathy.  Skin: Skin is warm and dry. She is not diaphoretic.   MSK: Full active and passive ROM of hips with ad/abduction, flexion/extension, int/ext rotation bilaterally.  Tenderness to palpation along iliac crest on left.  No SI joint tenderness.  No paraspinous tenderness.  NO CVA TENDERNESS. Abdomen is soft and nontender to my examination.  JB Blood pressure 168/86, pulse 94, temperature 97.7 F (36.5 C), temperature source Oral, height 5\' 7"  (1.702 m), weight 149 lb (67.586 kg).  Urinalysis: positive for glucose only      Assessment & Plan:

## 2012-02-17 ENCOUNTER — Encounter: Payer: Self-pay | Admitting: Family Medicine

## 2012-02-17 ENCOUNTER — Telehealth: Payer: Self-pay | Admitting: Family Medicine

## 2012-02-17 ENCOUNTER — Ambulatory Visit
Admission: RE | Admit: 2012-02-17 | Discharge: 2012-02-17 | Disposition: A | Discharge status: Home / Self Care | Payer: Federal, State, Local not specified - PPO | Source: Ambulatory Visit | Attending: Family Medicine | Admitting: Family Medicine

## 2012-02-17 DIAGNOSIS — R109 Unspecified abdominal pain: Secondary | ICD-10-CM

## 2012-02-17 NOTE — Telephone Encounter (Signed)
Patient is calling for results

## 2012-02-17 NOTE — Telephone Encounter (Signed)
Forwarded to pcp.Destiny Morton  

## 2012-02-19 ENCOUNTER — Telehealth: Payer: Self-pay | Admitting: Family Medicine

## 2012-02-19 NOTE — Telephone Encounter (Signed)
Forwarded to Dr. Raymondo Band for appt instruction.Marland KitchenMarland KitchenLoralee Pacas Morton

## 2012-02-19 NOTE — Telephone Encounter (Signed)
Called patient and gave results of her labs and KUB. She reports that she is doing better, the pain is improving.  Paula Compton, M.D.

## 2012-02-19 NOTE — Telephone Encounter (Signed)
Patient needs an appointment set up with Dr. Gypsy Lore blood pressure clinic.

## 2012-02-20 ENCOUNTER — Telehealth: Payer: Self-pay | Admitting: Pharmacist

## 2012-02-20 NOTE — Telephone Encounter (Signed)
Called patient to schedule 24-hour ambulatory blood pressure monitoring per Dr. Rolene Arbour referral. Discussed the importance of adequate blood pressure control and how the BP appointment would be set up/procedure for wearing the monitor. Patient said she will consider it but says she is dealing with a few things at home at the moment and would prefer to hold off until those are settled. She said she will call the clinic to make an appointment with Dr. Raymondo Band when/if she is interested.   Benjaman Pott, PharmD    02/20/2012   10:19 AM

## 2012-03-26 ENCOUNTER — Other Ambulatory Visit: Payer: Self-pay | Admitting: Family Medicine

## 2012-04-02 ENCOUNTER — Other Ambulatory Visit: Payer: Self-pay | Admitting: Family Medicine

## 2012-04-08 ENCOUNTER — Other Ambulatory Visit: Payer: Self-pay | Admitting: Family Medicine

## 2012-04-08 ENCOUNTER — Telehealth: Payer: Self-pay | Admitting: Family Medicine

## 2012-04-08 MED ORDER — INSULIN NPH (HUMAN) (ISOPHANE) 100 UNIT/ML ~~LOC~~ SUSP: SUBCUTANEOUS | Status: DC

## 2012-04-08 NOTE — Telephone Encounter (Signed)
Spoke with patient and she states she never started Lantus . Was too expensive. She has continued with Humulin N and  takes 38 units in AM and between 15 and 20 in PM.  She wants refill on this. Will forward to MD.

## 2012-04-08 NOTE — Telephone Encounter (Signed)
Refilled medication

## 2012-04-08 NOTE — Telephone Encounter (Signed)
Insulin is too expensive and wants to change it Humulin N  [facility] [facility]

## 2012-07-08 ENCOUNTER — Other Ambulatory Visit: Payer: Self-pay | Admitting: Family Medicine

## 2012-07-19 ENCOUNTER — Other Ambulatory Visit: Payer: Self-pay | Admitting: Family Medicine

## 2012-07-24 ENCOUNTER — Encounter (HOSPITAL_COMMUNITY): Payer: Self-pay

## 2012-07-24 ENCOUNTER — Emergency Department (HOSPITAL_COMMUNITY)
Admission: EM | Admit: 2012-07-24 | Discharge: 2012-07-24 | Disposition: A | Discharge status: Home / Self Care | Payer: Federal, State, Local not specified - PPO | Attending: Emergency Medicine | Admitting: Emergency Medicine

## 2012-07-24 DIAGNOSIS — R109 Unspecified abdominal pain: Secondary | ICD-10-CM

## 2012-07-24 DIAGNOSIS — N898 Other specified noninflammatory disorders of vagina: Secondary | ICD-10-CM

## 2012-07-24 DIAGNOSIS — I10 Essential (primary) hypertension: Secondary | ICD-10-CM | POA: Insufficient documentation

## 2012-07-24 DIAGNOSIS — Z794 Long term (current) use of insulin: Secondary | ICD-10-CM | POA: Insufficient documentation

## 2012-07-24 DIAGNOSIS — E119 Type 2 diabetes mellitus without complications: Secondary | ICD-10-CM | POA: Insufficient documentation

## 2012-07-24 DIAGNOSIS — R739 Hyperglycemia, unspecified: Secondary | ICD-10-CM

## 2012-07-24 HISTORY — DX: Essential (primary) hypertension: I10

## 2012-07-24 LAB — URINE MICROSCOPIC-ADD ON

## 2012-07-24 LAB — URINALYSIS, ROUTINE W REFLEX MICROSCOPIC
Bilirubin Urine: NEGATIVE
Glucose, UA: >1000 mg/dL — AB
Hgb urine dipstick: NEGATIVE
Ketones, ur: NEGATIVE mg/dL
Leukocytes, UA: NEGATIVE
Nitrite: NEGATIVE
Protein, ur: NEGATIVE mg/dL
Specific Gravity, Urine: 1.033 — ABNORMAL HIGH (ref 1.005–1.030)
Urobilinogen, UA: 0.2 mg/dL (ref 0.0–1.0)
pH: 6 (ref 5.0–8.0)

## 2012-07-24 LAB — CBC WITH DIFFERENTIAL/PLATELET
Basophils Absolute: 0 10*3/uL (ref 0.0–0.1)
Basophils Relative: 1 % (ref 0–1)
Eosinophils Absolute: 0 10*3/uL (ref 0.0–0.7)
Eosinophils Relative: 1 % (ref 0–5)
HCT: 37.7 % (ref 36.0–46.0)
Hemoglobin: 12.5 g/dL (ref 12.0–15.0)
Lymphocytes Relative: 33 % (ref 12–46)
Lymphs Abs: 1.8 10*3/uL (ref 0.7–4.0)
MCH: 27.5 pg (ref 26.0–34.0)
MCHC: 33.2 g/dL (ref 30.0–36.0)
MCV: 83 fL (ref 78.0–100.0)
Monocytes Absolute: 0.4 10*3/uL (ref 0.1–1.0)
Monocytes Relative: 8 % (ref 3–12)
Neutro Abs: 3.1 10*3/uL (ref 1.7–7.7)
Neutrophils Relative %: 58 % (ref 43–77)
Platelets: 286 10*3/uL (ref 150–400)
RBC: 4.54 MIL/uL (ref 3.87–5.11)
RDW: 13.4 % (ref 11.5–15.5)
WBC: 5.4 10*3/uL (ref 4.0–10.5)

## 2012-07-24 LAB — WET PREP, GENITAL
Clue Cells Wet Prep HPF POC: NONE SEEN
Trich, Wet Prep: NONE SEEN
WBC, Wet Prep HPF POC: NONE SEEN
Yeast Wet Prep HPF POC: NONE SEEN

## 2012-07-24 LAB — POCT I-STAT, CHEM 8
BUN: 11 mg/dL (ref 6–23)
Calcium, Ion: 1.24 mmol/L — ABNORMAL HIGH (ref 1.12–1.23)
Chloride: 101 mEq/L (ref 96–112)
Creatinine, Ser: 0.8 mg/dL (ref 0.50–1.10)
Glucose, Bld: 304 mg/dL — ABNORMAL HIGH (ref 70–99)
HCT: 42 % (ref 36.0–46.0)
Hemoglobin: 14.3 g/dL (ref 12.0–15.0)
Potassium: 3.8 mEq/L (ref 3.5–5.1)
Sodium: 140 mEq/L (ref 135–145)
TCO2: 25 mmol/L (ref 0–100)

## 2012-07-24 NOTE — ED Notes (Signed)
Onset of back and low abd pain for 1 month, vaginal discharge and odor, denies bowel and bladder chagnes.

## 2012-07-24 NOTE — ED Provider Notes (Signed)
History     CSN: [id number]  Arrival date & time 07/24/12  1136   First MD Initiated Contact with Patient 07/24/12 1435      Chief Complaint  Patient presents with  . Back Pain  . Abdominal Pain  . Vaginal Discharge    (Consider location/radiation/quality/duration/timing/severity/associated sxs/prior treatment) Patient is a 56 y.o. female presenting with back pain. The history is provided by the patient.  Back Pain  This is a recurrent problem. The current episode started more than 1 week ago. The problem occurs constantly. The problem has not changed since onset.Associated symptoms include abdominal pain. Pertinent negatives include no chest pain, no fever and no dysuria. Associated symptoms comments: She reports bilateral back pain that wraps around to abdomen x 1 month. She has had similar symptoms in the past. No fever, N, V, or change in BMs. It is worse with certain movements. No urinary symptoms, cough. She reports a vaginal discharge also for one month. No lower abdominal pain. .    Past Medical History  Diagnosis Date  . Hypertension   . Diabetes mellitus     No past surgical history on file.  No family history on file.  History  Substance Use Topics  . Smoking status: Never Smoker   . Smokeless tobacco: Not on file  . Alcohol Use: No    OB History    Grav Para Term Preterm Abortions TAB SAB Ect Mult Living                  Review of Systems  Constitutional: Negative for fever and chills.  Respiratory: Negative for cough and shortness of breath.   Cardiovascular: Negative for chest pain.  Gastrointestinal: Positive for abdominal pain. Negative for nausea, vomiting and diarrhea.  Genitourinary: Positive for vaginal discharge. Negative for dysuria, frequency and flank pain.  Musculoskeletal: Positive for back pain.    Allergies  Hydrochlorothiazide; Losartan potassium-hctz; and Valsartan  Home Medications   Current Outpatient Rx  Name Route Sig  Dispense Refill  . AMLODIPINE BESYLATE 10 MG PO TABS Oral Take 10 mg by mouth daily.    . ASPIRIN 81 MG PO TBEC Oral Take 81 mg by mouth daily.      . OMEGA-3 FATTY ACIDS 1000 MG PO CAPS Oral Take 1 g by mouth daily.    Marland Kitchen HYDROCHLOROTHIAZIDE 12.5 MG PO CAPS Oral Take 12.5 mg by mouth daily as needed. For fluid retension    . INSULIN ISOPHANE HUMAN 100 UNIT/ML [place] SUSP Subcutaneous Inject 15-38 Units into the skin 2 (two) times daily before a meal. Take 38 units in the morning and 15 unit in the evening    . METFORMIN HCL 500 MG PO TABS Oral Take 250 mg by mouth daily as needed. For cbg greater than 145    . METOPROLOL TARTRATE 50 MG PO TABS Oral Take 50 mg by mouth daily.    . ADULT MULTIVITAMIN W/MINERALS CH Oral Take 1 tablet by mouth daily.    Marland Kitchen ACCU-CHEK COMPACT TEST DRUM VI STRP  USE 4 TIMES A DAY AS       DIRECTED 200 each 3  . ACETONE (URINE) TEST VI STRP Does not apply 1 strip by Does not apply route as needed. 25 each 0  . BD INSULIN SYRINGE ULTRAFINE 30G X 1/2" 0.5 ML MISC  USE TWICE DAILY AS DIRECTED 180 each 3  . ACCU-CHEK SOFT TOUCH LANCETS MISC  Use as instructed 100 each 12    BP  153/89  Pulse 77  Temp 97.9 F (36.6 C) (Oral)  Resp 16  SpO2 100%  Physical Exam  Constitutional: She appears well-developed and well-nourished.  HENT:  Head: Normocephalic.  Neck: Normal range of motion. Neck supple.  Cardiovascular: Normal rate and regular rhythm.   Pulmonary/Chest: Effort normal and breath sounds normal.  Abdominal: Soft. Bowel sounds are normal. There is no tenderness. There is no rebound and no guarding.  Genitourinary: Uterus normal. Vaginal discharge found.       No adnexal mass or tenderness. Vaginal discharge in vaginal vault that is white, thin. No cervical discharge, cervical tenderness.  Musculoskeletal: Normal range of motion.  Neurological: She is alert. No cranial nerve deficit.  Skin: Skin is warm and dry. No rash noted.  Psychiatric: She has a normal mood  and affect.    ED Course  Procedures (including critical care time)  Labs Reviewed  URINALYSIS, ROUTINE W REFLEX MICROSCOPIC - Abnormal; Notable for the following:    Specific Gravity, Urine 1.033 (*)     Glucose, UA >1000 (*)     All other components within normal limits  POCT I-STAT, CHEM 8 - Abnormal; Notable for the following:    Glucose, Bld 304 (*)     Calcium, Ion 1.24 (*)     All other components within normal limits  URINE MICROSCOPIC-ADD ON - Abnormal; Notable for the following:    Squamous Epithelial / LPF FEW (*)     Bacteria, UA FEW (*)     All other components within normal limits  CBC WITH DIFFERENTIAL  WET PREP, GENITAL  GC/CHLAMYDIA PROBE AMP, GENITAL   No results found. Results for orders placed during the hospital encounter of 07/24/12  URINALYSIS, ROUTINE W REFLEX MICROSCOPIC      Component Value Range   Color, Urine YELLOW  YELLOW   APPearance CLEAR  CLEAR   Specific Gravity, Urine 1.033 (*) 1.005 - 1.030   pH 6.0  5.0 - 8.0   Glucose, UA >1000 (*) NEGATIVE mg/dL   Hgb urine dipstick NEGATIVE  NEGATIVE   Bilirubin Urine NEGATIVE  NEGATIVE   Ketones, ur NEGATIVE  NEGATIVE mg/dL   Protein, ur NEGATIVE  NEGATIVE mg/dL   Urobilinogen, UA 0.2  0.0 - 1.0 mg/dL   Nitrite NEGATIVE  NEGATIVE   Leukocytes, UA NEGATIVE  NEGATIVE  CBC WITH DIFFERENTIAL      Component Value Range   WBC 5.4  4.0 - 10.5 K/uL   RBC 4.54  3.87 - 5.11 MIL/uL   Hemoglobin 12.5  12.0 - 15.0 g/dL   HCT 16.1  09.6 - 04.5 %   MCV 83.0  78.0 - 100.0 fL   MCH 27.5  26.0 - 34.0 pg   MCHC 33.2  30.0 - 36.0 g/dL   RDW 40.9  81.1 - 91.4 %   Platelets 286  150 - 400 K/uL   Neutrophils Relative 58  43 - 77 %   Neutro Abs 3.1  1.7 - 7.7 K/uL   Lymphocytes Relative 33  12 - 46 %   Lymphs Abs 1.8  0.7 - 4.0 K/uL   Monocytes Relative 8  3 - 12 %   Monocytes Absolute 0.4  0.1 - 1.0 K/uL   Eosinophils Relative 1  0 - 5 %   Eosinophils Absolute 0.0  0.0 - 0.7 K/uL   Basophils Relative 1  0 -  1 %   Basophils Absolute 0.0  0.0 - 0.1 K/uL  POCT I-STAT, CHEM 8  Component Value Range   Sodium 140  135 - 145 mEq/L   Potassium 3.8  3.5 - 5.1 mEq/L   Chloride 101  96 - 112 mEq/L   BUN 11  6 - 23 mg/dL   Creatinine, Ser 1.47  0.50 - 1.10 mg/dL   Glucose, Bld 829 (*) 70 - 99 mg/dL   Calcium, Ion 5.62 (*) 1.12 - 1.23 mmol/L   TCO2 25  0 - 100 mmol/L   Hemoglobin 14.3  12.0 - 15.0 g/dL   HCT 13.0  86.5 - 78.4 %  URINE MICROSCOPIC-ADD ON      Component Value Range   Squamous Epithelial / LPF FEW (*) RARE   WBC, UA 0-2  <3 WBC/hpf   RBC / HPF 0-2  <3 RBC/hpf   Bacteria, UA FEW (*) RARE     No diagnosis found.  1. Abdominal pain 2. Hyperglycemia   MDM  The patient requests discharge from the ED prior to results of wet prep. No pelvic tenderness, doubt PID, torsion, abscess. Abdomen is not tender, is soft and lab studies are normal except high blood sugar. Doubt intrabdominal infection or process.  She is a family practice patient and is encouraged to follow up with them.         Rodena Medin, PA-C 07/24/12 [phone]

## 2012-07-24 NOTE — ED Notes (Signed)
Pt. Desiring to go asap; Sheri PA-C aware; pt. Wants to leave b/c of family obligations.

## 2012-07-24 NOTE — ED Provider Notes (Signed)
Medical screening examination/treatment/procedure(s) were performed by non-physician practitioner and as supervising physician I was immediately available for consultation/collaboration.   Dione Booze, MD 07/24/12 [phone]

## 2012-07-25 LAB — GC/CHLAMYDIA PROBE AMP, GENITAL
Chlamydia, DNA Probe: NEGATIVE
GC Probe Amp, Genital: NEGATIVE

## 2012-07-30 ENCOUNTER — Other Ambulatory Visit: Payer: Self-pay | Admitting: Family Medicine

## 2012-07-31 ENCOUNTER — Encounter (HOSPITAL_COMMUNITY): Payer: Self-pay | Admitting: Emergency Medicine

## 2012-07-31 ENCOUNTER — Emergency Department (HOSPITAL_COMMUNITY): Payer: Federal, State, Local not specified - PPO

## 2012-07-31 ENCOUNTER — Emergency Department (HOSPITAL_COMMUNITY)
Admission: EM | Admit: 2012-07-31 | Discharge: 2012-07-31 | Disposition: A | Discharge status: Home / Self Care | Payer: Federal, State, Local not specified - PPO | Attending: Emergency Medicine | Admitting: Emergency Medicine

## 2012-07-31 DIAGNOSIS — N390 Urinary tract infection, site not specified: Secondary | ICD-10-CM | POA: Insufficient documentation

## 2012-07-31 DIAGNOSIS — E119 Type 2 diabetes mellitus without complications: Secondary | ICD-10-CM | POA: Insufficient documentation

## 2012-07-31 DIAGNOSIS — M549 Dorsalgia, unspecified: Secondary | ICD-10-CM

## 2012-07-31 DIAGNOSIS — R9389 Abnormal findings on diagnostic imaging of other specified body structures: Secondary | ICD-10-CM

## 2012-07-31 DIAGNOSIS — I10 Essential (primary) hypertension: Secondary | ICD-10-CM | POA: Insufficient documentation

## 2012-07-31 DIAGNOSIS — M25559 Pain in unspecified hip: Secondary | ICD-10-CM | POA: Insufficient documentation

## 2012-07-31 DIAGNOSIS — Z794 Long term (current) use of insulin: Secondary | ICD-10-CM | POA: Insufficient documentation

## 2012-07-31 LAB — URINALYSIS, ROUTINE W REFLEX MICROSCOPIC
Bilirubin Urine: NEGATIVE
Glucose, UA: 250 mg/dL — AB
Hgb urine dipstick: NEGATIVE
Ketones, ur: NEGATIVE mg/dL
Nitrite: NEGATIVE
Protein, ur: 30 mg/dL — AB
Specific Gravity, Urine: 1.028 (ref 1.005–1.030)
Urobilinogen, UA: 0.2 mg/dL (ref 0.0–1.0)
pH: 5.5 (ref 5.0–8.0)

## 2012-07-31 LAB — URINE MICROSCOPIC-ADD ON

## 2012-07-31 MED ORDER — SULFAMETHOXAZOLE-TMP DS 800-160 MG PO TABS: 1.0000 | ORAL_TABLET | Freq: Once | ORAL | Status: AC

## 2012-07-31 MED ORDER — SULFAMETHOXAZOLE-TRIMETHOPRIM 800-160 MG PO TABS: 1.0000 | ORAL_TABLET | Freq: Two times a day (BID) | ORAL | Status: AC

## 2012-07-31 MED ORDER — TRAMADOL HCL 50 MG PO TABS: 50.0000 mg | ORAL_TABLET | Freq: Four times a day (QID) | ORAL | Status: AC | PRN

## 2012-07-31 MED ORDER — IBUPROFEN 400 MG PO TABS
800.0000 mg | ORAL_TABLET | Freq: Once | ORAL | Status: AC
  Administered 2012-07-31: 800 mg via ORAL
  Filled 2012-07-31: qty 4

## 2012-07-31 MED ORDER — SULFAMETHOXAZOLE-TMP DS 800-160 MG PO TABS
1.0000 | ORAL_TABLET | Freq: Once | ORAL | Status: AC
  Administered 2012-07-31: 1 via ORAL
  Filled 2012-07-31 (×2): qty 1

## 2012-07-31 NOTE — ED Notes (Signed)
Pt c/o right back pain x 3 weeks into hip and groin area; pt sts painful to walk

## 2012-07-31 NOTE — Progress Notes (Signed)
56 year old lady with pain in the right hip that goes into the right thigh.  No history of injury.  Exam shows her to be in no distress.  She localizes some pain in the midlumbar region.  There is no sensory or motor loss in the legs.  CT shows ? disciitis of L3.  Pt left today, will need to have MRI of the back to check for possible disc infection.  We have called Family Practice, and we will pursue getting her back for MRI of the lumbar spine.

## 2012-07-31 NOTE — ED Provider Notes (Signed)
History     CSN: [id number]  Arrival date & time 07/31/12  4696   First MD Initiated Contact with Patient 07/31/12 1016      Chief Complaint  Patient presents with  . Back Pain  . Hip Pain    (Consider location/radiation/quality/duration/timing/severity/associated sxs/prior treatment) Patient is a 56 y.o. female presenting with hip pain. The history is provided by the patient.  Hip Pain This is a new problem. The current episode started 1 to 4 weeks ago. The problem occurs constantly. Pertinent negatives include no abdominal pain, fever, nausea, numbness or weakness. Associated symptoms comments: She has right lower back and hip pain that radiates into right thigh. It is worse with movement, better with certain positions. No known injury or fall. No numbness, tingling. The discomfort has been ongoing for the past 3 weeks. She denies urinary symptoms, fever, flank pain or hematuria..    Past Medical History  Diagnosis Date  . Hypertension   . Diabetes mellitus     History reviewed. No pertinent past surgical history.  History reviewed. No pertinent family history.  History  Substance Use Topics  . Smoking status: Never Smoker   . Smokeless tobacco: Not on file  . Alcohol Use: No    OB History    Grav Para Term Preterm Abortions TAB SAB Ect Mult Living                  Review of Systems  Constitutional: Negative for fever.  Gastrointestinal: Negative for nausea and abdominal pain.  Genitourinary: Negative for dysuria, hematuria and flank pain.  Musculoskeletal: Positive for back pain.       See HPI.  Neurological: Negative for weakness and numbness.    Allergies  Hydrochlorothiazide; Losartan potassium-hctz; and Valsartan  Home Medications   Current Outpatient Rx  Name Route Sig Dispense Refill  . AMLODIPINE BESYLATE 10 MG PO TABS Oral Take 10 mg by mouth daily.    . ASPIRIN 81 MG PO TBEC Oral Take 81 mg by mouth daily.      . OMEGA-3 FATTY ACIDS 1000 MG PO  CAPS Oral Take 1 g by mouth daily.    Marland Kitchen HYDROCHLOROTHIAZIDE 12.5 MG PO CAPS Oral Take 12.5 mg by mouth daily as needed. For fluid retension    . INSULIN ISOPHANE HUMAN 100 UNIT/ML [place] SUSP Subcutaneous Inject 15-38 Units into the skin 2 (two) times daily before a meal. Take 38 units in the morning and 15 unit in the evening    . METFORMIN HCL 500 MG PO TABS Oral Take 250 mg by mouth daily as needed. For cbg greater than 145    . METOPROLOL TARTRATE 50 MG PO TABS Oral Take 50 mg by mouth daily.    . ADULT MULTIVITAMIN W/MINERALS CH Oral Take 1 tablet by mouth daily.      BP 134/85  Pulse 86  Temp 98.4 F (36.9 C) (Oral)  Resp 16  SpO2 100%  Physical Exam  Constitutional: She is oriented to person, place, and time. She appears well-developed and well-nourished.  HENT:  Head: Normocephalic.  Neck: Normal range of motion. Neck supple.  Cardiovascular: Normal rate and regular rhythm.   Pulmonary/Chest: Effort normal and breath sounds normal.  Abdominal: Soft. Bowel sounds are normal. There is no tenderness. There is no rebound and no guarding.  Musculoskeletal: Normal range of motion.       Mild lumbar and paralumbar tenderness without swelling or discoloration.  Neurological: She is alert and oriented to  person, place, and time. She has normal reflexes. Coordination normal.  Skin: Skin is warm and dry. No rash noted.  Psychiatric: She has a normal mood and affect.    ED Course  Procedures (including critical care time)  Labs Reviewed  URINALYSIS, ROUTINE W REFLEX MICROSCOPIC - Abnormal; Notable for the following:    Color, Urine AMBER (*)  BIOCHEMICALS MAY BE AFFECTED BY COLOR   APPearance CLOUDY (*)     Glucose, UA 250 (*)     Protein, ur 30 (*)     Leukocytes, UA LARGE (*)     All other components within normal limits  URINE MICROSCOPIC-ADD ON - Abnormal; Notable for the following:    Squamous Epithelial / LPF MANY (*)     Bacteria, UA MANY (*)     Crystals CA OXALATE  CRYSTALS (*)     All other components within normal limits   Dg Pelvis 1-2 Views  07/31/2012  *RADIOLOGY REPORT*  Clinical Data: Back pain.  Hip pain.  PELVIS - 1-2 VIEW  Comparison: Abdominal radiographs dated 02/17/2012  Findings: No acute fracture and no dislocation.  Mild narrowing of the hip joints bilaterally.  Mild degenerative change of the SI joints.  Lucency extends across both femoral heads which is stable compared the prior study and likely represents acetabular anatomic components.  IMPRESSION: No acute bony pathology.  Mild degenerative change.   Original Report Authenticated By: Donavan Burnet, M.D.      No diagnosis found. 1. Low back pain 2. uti 3. Abnormal radiographic finding   MDM  X-ray done for persistent pain with multiple ED presentations. Patient has UTI found on exam which may be  Incidental but contains CA oxylate stones. Culture pending. Abx given. Patient reports ibuprofen improves pain - given in ED. CT scan to evaluate for kidney stones pending.  CT scan questionable for discitis, MRI recommended. Patient refused to undergo another study. She signed out AMA - rx for abx for uti given, ultram for pain. Discussed condition with Family Practice who will arrange follow up on Monday. Outpatient MRI arranged for her tomorrow, [facility] to follow up on result and any further treatment.         Rodena Medin, PA-C 07/31/12 [phone]

## 2012-08-01 NOTE — ED Provider Notes (Signed)
Medical screening examination/treatment/procedure(s) were conducted as a shared visit with non-physician practitioner(s) and myself.  I personally evaluated the patient during the encounter 56 year old lady with pain in the right hip that goes into the right thigh. No history of injury. Exam shows her to be in no distress. She localizes some pain in the midlumbar region. There is no sensory or motor loss in the legs. CT shows ? disciitis of L3. Pt left today, will need to have MRI of the back to check for possible disc infection. We have called Family Practice, and we will pursue getting her back for MRI of the lumbar spine.       Carleene Cooper III, MD 08/01/12 [phone]

## 2012-08-05 ENCOUNTER — Ambulatory Visit (INDEPENDENT_AMBULATORY_CARE_PROVIDER_SITE_OTHER): Payer: Federal, State, Local not specified - PPO | Admitting: Family Medicine

## 2012-08-05 ENCOUNTER — Encounter: Payer: Self-pay | Admitting: Family Medicine

## 2012-08-05 VITALS — BP 133/79 | HR 78 | Temp 98.2°F | Wt 138.0 lb

## 2012-08-05 DIAGNOSIS — E118 Type 2 diabetes mellitus with unspecified complications: Secondary | ICD-10-CM

## 2012-08-05 DIAGNOSIS — E1165 Type 2 diabetes mellitus with hyperglycemia: Secondary | ICD-10-CM

## 2012-08-05 DIAGNOSIS — M549 Dorsalgia, unspecified: Secondary | ICD-10-CM

## 2012-08-05 DIAGNOSIS — IMO0002 Reserved for concepts with insufficient information to code with codable children: Secondary | ICD-10-CM

## 2012-08-05 LAB — CBC
HCT: 34 % — ABNORMAL LOW (ref 36.0–46.0)
Hemoglobin: 11.4 g/dL — ABNORMAL LOW (ref 12.0–15.0)
MCH: 26.6 pg (ref 26.0–34.0)
MCHC: 33.5 g/dL (ref 30.0–36.0)
MCV: 79.4 fL (ref 78.0–100.0)
Platelets: 356 10*3/uL (ref 150–400)
RBC: 4.28 MIL/uL (ref 3.87–5.11)
RDW: 13.5 % (ref 11.5–15.5)
WBC: 4.7 10*3/uL (ref 4.0–10.5)

## 2012-08-05 LAB — POCT GLYCOSYLATED HEMOGLOBIN (HGB A1C): Hemoglobin A1C: 10.4

## 2012-08-05 LAB — C-REACTIVE PROTEIN: CRP: 0.7 mg/dL — ABNORMAL HIGH (ref <0.60)

## 2012-08-05 NOTE — Patient Instructions (Signed)
It was great to see you today! I want you to get your MRI done.  This is very important.  Either myself or one of the nurses will be in touch with you Friday about results of your MRI. If you need to come back for labs, please schedule a lab appointment at the front desk on your way out. If we drew labs today, I will call you if any results are abnormal.  Otherwise you will get a letter in the mail.

## 2012-08-05 NOTE — Assessment & Plan Note (Signed)
Concern for osteo vs discitis on CT.  Will obtain MRi.  No significant findings on exam so no need to hospitalize.  Will go ahead and obtain both a CRP and a CBC.  Will contact patient about results of MRI and, if needed, will talk with ortho and/or ID.

## 2012-08-05 NOTE — Progress Notes (Signed)
Patient ID: Destiny Morton, female   DOB: 1956-10-17, 56 y.o.   MRN: [id number] Subjective: The patient is a 56 y.o. year old female who presents today for f/u.  1. Back pain: 3 visits to ED in last 2 weeks for back/side pain with some radiation down left leg.  Does have history of a herniated disk about 10 years ago but has not had any problems since.  In ED was diagnosed with UTI and ?discitis.  Patient did not follow up with MRI.  Patient continues to have some low-grade back pain, controlled by advil.  No weakness, maybe some numbness in right foot.  No fevers/chills, n/v/d.  Patient's past medical, social, and family history were reviewed and updated as appropriate. History  Substance Use Topics  . Smoking status: Never Smoker   . Smokeless tobacco: Not on file  . Alcohol Use: No   Objective:  Filed Vitals:   08/05/12 1048  BP: 133/79  Pulse: 78  Temp: 98.2 F (36.8 C)   Gen: NAD, appropriate weight Back: No tenderness to palpation, no warmth, no erythema Ext: Neg straight leg raise, reflexes normal  Assessment/Plan:  Please also see individual problems in problem list for problem-specific plans.

## 2012-08-06 LAB — COMPREHENSIVE METABOLIC PANEL
ALT: 16 U/L (ref 0–35)
AST: 14 U/L (ref 0–37)
Albumin: 3.5 g/dL (ref 3.5–5.2)
Alkaline Phosphatase: 95 U/L (ref 39–117)
BUN: 11 mg/dL (ref 6–23)
CO2: 26 mEq/L (ref 19–32)
Calcium: 9.3 mg/dL (ref 8.4–10.5)
Chloride: 103 mEq/L (ref 96–112)
Creat: 1.05 mg/dL (ref 0.50–1.10)
Glucose, Bld: 112 mg/dL — ABNORMAL HIGH (ref 70–99)
Potassium: 4.5 mEq/L (ref 3.5–5.3)
Sodium: 137 mEq/L (ref 135–145)
Total Bilirubin: 0.6 mg/dL (ref 0.3–1.2)
Total Protein: 7.4 g/dL (ref 6.0–8.3)

## 2012-08-08 ENCOUNTER — Other Ambulatory Visit: Payer: Federal, State, Local not specified - PPO

## 2012-08-15 ENCOUNTER — Other Ambulatory Visit: Payer: Federal, State, Local not specified - PPO

## 2012-08-20 ENCOUNTER — Telehealth: Payer: Self-pay | Admitting: Family Medicine

## 2012-08-20 NOTE — Telephone Encounter (Signed)
Pt is asking for results of her labs from 2 weeks ago

## 2012-08-21 NOTE — Telephone Encounter (Signed)
Will route to Dr. Cristal Ford.

## 2012-08-22 ENCOUNTER — Other Ambulatory Visit: Payer: Federal, State, Local not specified - PPO

## 2012-08-24 NOTE — Telephone Encounter (Signed)
Dr. Louanne Belton saw her and ordered labs that look rather unremarkable, except borderline elevated CRP (some nonspecific inflammation). She is supposed to have gotten an MRI of her back which is ordered, but hasn't been done. Please make sure she has this scheduled so she can get it. Needs to f/u with me or Ritch to discuss MRI results after it's done. Thanks.

## 2012-08-25 NOTE — Telephone Encounter (Signed)
Spoke with patient and informed her of her labs. The MRI had been cancelled 3 times. Patient states that the 2 first times and the last time she cancelled. She will call back when she is able to get to this appointment

## 2012-09-18 ENCOUNTER — Other Ambulatory Visit: Payer: Self-pay | Admitting: Family Medicine

## 2012-10-19 ENCOUNTER — Encounter: Payer: Self-pay | Admitting: Home Health Services

## 2012-10-21 ENCOUNTER — Encounter: Payer: Self-pay | Admitting: Home Health Services

## 2012-10-27 ENCOUNTER — Other Ambulatory Visit: Payer: Self-pay | Admitting: Family Medicine

## 2012-11-04 ENCOUNTER — Other Ambulatory Visit: Payer: Self-pay | Admitting: Family Medicine

## 2012-11-10 ENCOUNTER — Encounter: Payer: Self-pay | Admitting: Emergency Medicine

## 2012-11-10 ENCOUNTER — Telehealth: Payer: Self-pay | Admitting: Family Medicine

## 2012-11-10 ENCOUNTER — Ambulatory Visit (INDEPENDENT_AMBULATORY_CARE_PROVIDER_SITE_OTHER): Payer: Federal, State, Local not specified - PPO | Admitting: Emergency Medicine

## 2012-11-10 VITALS — BP 169/90 | HR 97 | Temp 99.1°F | Ht 67.0 in | Wt 138.4 lb

## 2012-11-10 DIAGNOSIS — N898 Other specified noninflammatory disorders of vagina: Secondary | ICD-10-CM

## 2012-11-10 DIAGNOSIS — R300 Dysuria: Secondary | ICD-10-CM

## 2012-11-10 LAB — POCT URINALYSIS DIPSTICK
Bilirubin, UA: NEGATIVE
Glucose, UA: 500
Leukocytes, UA: NEGATIVE
Nitrite, UA: NEGATIVE
Protein, UA: NEGATIVE
Spec Grav, UA: 1.025
Urobilinogen, UA: 0.2
pH, UA: 6

## 2012-11-10 LAB — POCT WET PREP (WET MOUNT): Clue Cells Wet Prep Whiff POC: NEGATIVE

## 2012-11-10 MED ORDER — MICONAZOLE NITRATE 4 % VA CREA: 1.0000 | TOPICAL_CREAM | Freq: Every day | VAGINAL | Status: DC

## 2012-11-10 NOTE — Assessment & Plan Note (Addendum)
Possible yeast on exam.  Wet prep collected and will treat based on results.  I will call the patient when results are available.  UA negative for infection but did have small Hgb and 500 glucose.  She was instructed to f/u with PCP next week.

## 2012-11-10 NOTE — Progress Notes (Signed)
  Subjective:    Patient ID: Destiny Morton, female    DOB: 1956/02/10, 57 y.o.   MRN: [id number]  HPI Destiny Morton is here for a SDA for vaginal discharge and possible boil.  She reports noticing a vaginal discharge with a foul odor for the last 2-3 days, associated with vaginal itching.  Also concerned about possible bladder infection, given frequency.  No dysuria.  Describes a lump on her right buttock that has been present for 1 month, no pain, no drainage.  Also mentioned recurrence of low back pain.  PMHx: uncontrolled diabetes, uncontrolled HTN  SHx: never smoker  Review of Systems See HPI    Objective:   Physical Exam BP 169/90  Pulse 97  Temp 99.1 F (37.3 C) (Oral)  Ht 5\' 7"  (1.702 m)  Wt 138 lb 6.4 oz (62.778 kg)  BMI 21.68 kg/m2 Gen: alert, cooperative, NAD Pelvic: normal external genitalia, external hemorrhoids noted, normal vagina, some thick chunky white discharge present, no CMT, bimanual normal Abd: soft, NTND     Assessment & Plan:  Encouraged follow up with PCP to discuss diabetes and HTN as she does have glucosuria today and her BP is elevated.  Also needs f/u for back pain.  Possible boil: This may have been a cyst that regressed as it was unable to be palpated today.

## 2012-11-10 NOTE — Telephone Encounter (Signed)
Called and discussed results of wet prep with patient.  Wet prep was negative, but she had discharge that looked like yeast on exam.  I am going to treat for a yeast infection.  Patient reports that she prefers the cream.  Will treat with miconazole cream x7 days.  If symptoms persist, she will return for a follow up appointment.

## 2012-11-10 NOTE — Telephone Encounter (Signed)
Is asking for results of her tests from this morning

## 2012-11-11 ENCOUNTER — Telehealth: Payer: Self-pay | Admitting: Family Medicine

## 2012-11-11 ENCOUNTER — Other Ambulatory Visit: Payer: Self-pay | Admitting: Emergency Medicine

## 2012-11-11 MED ORDER — MICONAZOLE NITRATE 2 % EX CREA: TOPICAL_CREAM | Freq: Two times a day (BID) | CUTANEOUS | Status: DC

## 2012-11-11 NOTE — Telephone Encounter (Signed)
Pt is asking for pain meds stronger than tylenol.  Was here yesterday, pls let her know [facility] [address]

## 2012-11-11 NOTE — Telephone Encounter (Signed)
I saw this patient on 11/10/12 for vaginal discharge and dysuria.  She did mention that she has back pain and a history of a herniated disk; however, this issue was not addressed during this visit.  I did encourage her to follow up with her PCP to address her back pain and blood pressure.  She will need an appt with her PCP or a SDA visit to specifically address her back pain.

## 2012-11-11 NOTE — Telephone Encounter (Signed)
Patient states that the Advil and tylenol is not helping her back pain and she is wanting to know if she can have something a little bit stronger. The pain is getting worse

## 2012-11-11 NOTE — Telephone Encounter (Signed)
The medication that was prescribed yesterday is not covered by her insurance and she needs something different sent in.  Please call patient when this has been done.

## 2012-11-13 ENCOUNTER — Emergency Department (HOSPITAL_COMMUNITY)
Admission: EM | Admit: 2012-11-13 | Discharge: 2012-11-13 | Disposition: A | Discharge status: Home / Self Care | Payer: Federal, State, Local not specified - PPO | Attending: Emergency Medicine | Admitting: Emergency Medicine

## 2012-11-13 ENCOUNTER — Encounter (HOSPITAL_COMMUNITY): Payer: Self-pay | Admitting: *Deleted

## 2012-11-13 DIAGNOSIS — I10 Essential (primary) hypertension: Secondary | ICD-10-CM | POA: Insufficient documentation

## 2012-11-13 DIAGNOSIS — G8929 Other chronic pain: Secondary | ICD-10-CM | POA: Insufficient documentation

## 2012-11-13 DIAGNOSIS — E119 Type 2 diabetes mellitus without complications: Secondary | ICD-10-CM | POA: Insufficient documentation

## 2012-11-13 DIAGNOSIS — Z79899 Other long term (current) drug therapy: Secondary | ICD-10-CM | POA: Insufficient documentation

## 2012-11-13 DIAGNOSIS — M545 Low back pain, unspecified: Secondary | ICD-10-CM

## 2012-11-13 DIAGNOSIS — Z7982 Long term (current) use of aspirin: Secondary | ICD-10-CM | POA: Insufficient documentation

## 2012-11-13 DIAGNOSIS — M79604 Pain in right leg: Secondary | ICD-10-CM

## 2012-11-13 MED ORDER — OXYCODONE-ACETAMINOPHEN 5-325 MG PO TABS
1.0000 | ORAL_TABLET | Freq: Once | ORAL | Status: AC
  Administered 2012-11-13: 1 via ORAL
  Filled 2012-11-13: qty 1

## 2012-11-13 MED ORDER — OXYCODONE-ACETAMINOPHEN 5-325 MG PO TABS: ORAL_TABLET | ORAL | Status: DC

## 2012-11-13 NOTE — ED Notes (Signed)
To ED for eval of lower back pain. States she has hx of herniated disc and thinks she recently picked up something heavy. Ambulatory slowly.

## 2012-11-13 NOTE — ED Notes (Signed)
Pt c/o lower back pain, down right leg. Hx of same. Here in August for same. Goes to [facility] and was seen this week for back pain. Was dx'd with yeast infection but was not given anything for pain. Hx of IDDM and HTN.

## 2012-11-13 NOTE — ED Provider Notes (Signed)
History     CSN: [id number]  Arrival date & time 11/13/12  0827   First MD Initiated Contact with Patient 11/13/12 1000      Chief Complaint  Patient presents with  . Back Pain    (Consider location/radiation/quality/duration/timing/severity/associated sxs/prior treatment) HPI   Destiny Morton is a 56 y.o. female complaining of low back pain that radiates to right leg. Denies trauma, fever,  IVDU, history of. Patient states that she was recently worsening a baby and also wearing high heels. She states that when she stands in the past but is exacerbated her low back pain. She's been taking Advil 400 mg every 4 hours with little relief.   Past Medical History  Diagnosis Date  . Hypertension   . Diabetes mellitus     History reviewed. No pertinent past surgical history.  History reviewed. No pertinent family history.  History  Substance Use Topics  . Smoking status: Never Smoker   . Smokeless tobacco: Not on file  . Alcohol Use: No    OB History    Grav Para Term Preterm Abortions TAB SAB Ect Mult Living                  Review of Systems  Constitutional: Negative for fever.  Respiratory: Negative for shortness of breath.   Cardiovascular: Negative for chest pain.  Gastrointestinal: Negative for nausea, vomiting, abdominal pain and diarrhea.  Musculoskeletal: Positive for back pain.  All other systems reviewed and are negative.    Allergies  Hydrochlorothiazide; Losartan potassium-hctz; and Valsartan  Home Medications   Current Outpatient Rx  Name  Route  Sig  Dispense  Refill  . AMLODIPINE BESYLATE 10 MG PO TABS   Oral   Take 10 mg by mouth daily.         . ASPIRIN 81 MG PO TBEC   Oral   Take 81 mg by mouth daily.           . BD INSULIN SYRINGE ULTRAFINE 30G X 1/2" 0.5 ML MISC               . OMEGA-3 FATTY ACIDS 1000 MG PO CAPS   Oral   Take 1 g by mouth daily.         Marland Kitchen HYDROCHLOROTHIAZIDE 12.5 MG PO CAPS   Oral   Take 12.5 mg by  mouth daily as needed. For fluid retension         . IBUPROFEN 200 MG PO TABS   Oral   Take 400 mg by mouth every 6 (six) hours as needed. For pain         . INSULIN ISOPHANE HUMAN 100 UNIT/ML [place] SUSP   Subcutaneous   Inject 15-38 Units into the skin 2 (two) times daily before a meal. Take 38 units in the morning and 15 unit in the evening         . METFORMIN HCL 500 MG PO TABS   Oral   Take 250 mg by mouth daily as needed. For cbg greater than 145         . METOPROLOL TARTRATE 50 MG PO TABS   Oral   Take 50 mg by mouth daily.         Marland Kitchen MICONAZOLE NITRATE 2 % EX CREA   Topical   Apply topically 2 (two) times daily. For 1 week.   28.35 g   0   . ADULT MULTIVITAMIN W/MINERALS CH   Oral   Take  1 tablet by mouth daily.           BP 151/85  Pulse 88  Temp 98.1 F (36.7 C) (Oral)  Resp 20  SpO2 100%  Physical Exam  Nursing note and vitals reviewed. Constitutional: She is oriented to person, place, and time. She appears well-developed and well-nourished. No distress.  HENT:  Head: Normocephalic.  Eyes: Conjunctivae normal and EOM are normal.  Cardiovascular: Normal rate.   Pulmonary/Chest: Effort normal. No stridor.  Musculoskeletal: Normal range of motion.       No tenderness to palpation. Straight leg raise is negative bilaterally.  Neurological: She is alert and oriented to person, place, and time.  Psychiatric: She has a normal mood and affect.    ED Course  Procedures (including critical care time)  Labs Reviewed - No data to display No results found.   1. Low back pain radiating to right leg       MDM  Uncomplicated exacerbation of chronic low back pain.   Pt verbalized understanding and agrees with care plan. Outpatient follow-up and return precautions given.    New Prescriptions   OXYCODONE-ACETAMINOPHEN (PERCOCET/ROXICET) 5-325 MG PER TABLET    1 to 2 tabs PO q6hrs  PRN for pain          Wynetta Emery, PA-C 11/13/12  1559

## 2012-11-14 NOTE — ED Provider Notes (Signed)
Medical screening examination/treatment/procedure(s) were performed by non-physician practitioner and as supervising physician I was immediately available for consultation/collaboration.   Charles B. Sheldon, MD 11/14/12 0833 

## 2012-11-20 ENCOUNTER — Ambulatory Visit (INDEPENDENT_AMBULATORY_CARE_PROVIDER_SITE_OTHER): Payer: Federal, State, Local not specified - PPO | Admitting: Family Medicine

## 2012-11-20 ENCOUNTER — Encounter: Payer: Self-pay | Admitting: Family Medicine

## 2012-11-20 VITALS — BP 150/85 | HR 93 | Temp 98.9°F | Ht 67.0 in | Wt 136.8 lb

## 2012-11-20 DIAGNOSIS — M549 Dorsalgia, unspecified: Secondary | ICD-10-CM

## 2012-11-20 DIAGNOSIS — IMO0002 Reserved for concepts with insufficient information to code with codable children: Secondary | ICD-10-CM

## 2012-11-20 DIAGNOSIS — N898 Other specified noninflammatory disorders of vagina: Secondary | ICD-10-CM

## 2012-11-20 DIAGNOSIS — IMO0001 Reserved for inherently not codable concepts without codable children: Secondary | ICD-10-CM

## 2012-11-20 DIAGNOSIS — E1165 Type 2 diabetes mellitus with hyperglycemia: Secondary | ICD-10-CM

## 2012-11-20 DIAGNOSIS — I10 Essential (primary) hypertension: Secondary | ICD-10-CM

## 2012-11-20 LAB — COMPREHENSIVE METABOLIC PANEL
ALT: 17 U/L (ref 0–35)
AST: 16 U/L (ref 0–37)
Albumin: 3.8 g/dL (ref 3.5–5.2)
Alkaline Phosphatase: 96 U/L (ref 39–117)
BUN: 13 mg/dL (ref 6–23)
CO2: 29 mEq/L (ref 19–32)
Calcium: 9.9 mg/dL (ref 8.4–10.5)
Chloride: 100 mEq/L (ref 96–112)
Creat: 0.69 mg/dL (ref 0.50–1.10)
Glucose, Bld: 331 mg/dL — ABNORMAL HIGH (ref 70–99)
Potassium: 4.3 mEq/L (ref 3.5–5.3)
Sodium: 137 mEq/L (ref 135–145)
Total Bilirubin: 0.4 mg/dL (ref 0.3–1.2)
Total Protein: 7.7 g/dL (ref 6.0–8.3)

## 2012-11-20 LAB — CBC WITH DIFFERENTIAL/PLATELET
Basophils Absolute: 0 10*3/uL (ref 0.0–0.1)
Basophils Relative: 0 % (ref 0–1)
Eosinophils Absolute: 0 10*3/uL (ref 0.0–0.7)
Eosinophils Relative: 0 % (ref 0–5)
HCT: 37.4 % (ref 36.0–46.0)
Hemoglobin: 12.3 g/dL (ref 12.0–15.0)
Lymphocytes Relative: 21 % (ref 12–46)
Lymphs Abs: 1.5 10*3/uL (ref 0.7–4.0)
MCH: 27.1 pg (ref 26.0–34.0)
MCHC: 32.9 g/dL (ref 30.0–36.0)
MCV: 82.4 fL (ref 78.0–100.0)
Monocytes Absolute: 0.5 10*3/uL (ref 0.1–1.0)
Monocytes Relative: 7 % (ref 3–12)
Neutro Abs: 5.2 10*3/uL (ref 1.7–7.7)
Neutrophils Relative %: 72 % (ref 43–77)
Platelets: 443 10*3/uL — ABNORMAL HIGH (ref 150–400)
RBC: 4.54 MIL/uL (ref 3.87–5.11)
RDW: 13.7 % (ref 11.5–15.5)
WBC: 7.2 10*3/uL (ref 4.0–10.5)

## 2012-11-20 LAB — POCT SEDIMENTATION RATE: POCT SED RATE: 78 mm/hr — AB (ref 0–22)

## 2012-11-20 LAB — TSH: TSH: 1.682 u[IU]/mL (ref 0.350–4.500)

## 2012-11-20 LAB — POCT GLYCOSYLATED HEMOGLOBIN (HGB A1C): Hemoglobin A1C: 11.2

## 2012-11-20 LAB — C-REACTIVE PROTEIN: CRP: 2.2 mg/dL — ABNORMAL HIGH (ref <0.60)

## 2012-11-20 MED ORDER — HYDROCODONE-ACETAMINOPHEN 7.5-500 MG PO TABS: 1.0000 | ORAL_TABLET | Freq: Four times a day (QID) | ORAL | Status: DC | PRN

## 2012-11-20 MED ORDER — METFORMIN HCL 500 MG PO TABS: 500.0000 mg | ORAL_TABLET | Freq: Every day | ORAL | Status: DC

## 2012-11-20 MED ORDER — METRONIDAZOLE 500 MG PO TABS: 500.0000 mg | ORAL_TABLET | Freq: Two times a day (BID) | ORAL | Status: DC

## 2012-11-20 NOTE — Assessment & Plan Note (Signed)
Sent empiric flagyl given the patient's reported symptoms and low risk of this medication to cause harmful effects.

## 2012-11-20 NOTE — Assessment & Plan Note (Signed)
Worsened. Restart metformin at low dose. Plan to increase if she tolerates. Warned of possible GI side effects. Check creatinine and electrolytes. She is very guarded in respect to medication changes. F/u in one month and consider increase in insulin if glycemia still above goal. Also will need visit with Rosalita Chessman in near future.

## 2012-11-20 NOTE — Progress Notes (Signed)
  Subjective:    Patient ID: Destiny Morton, female    DOB: 05/01/1956, 56 y.o.   MRN: [id number]  HPI  1. F/u Back pain. Has been to ED and clinic at least 6 times for this same pain. A CT scan showed concern for possible discitis, and was advised multiple times to get an MRI. Patient has no-showed this and canceled 2 or 3 times. She states she just wants to return to her previous back surgeon, Dr. Franky Macho instead of getting more and more tests without any improvement. She describes flared lower lumbar pain that sometimes radiates to right leg. There is no fever, chills, sweats, leg weakness, falls, incontinence, saddle anesthesia. She does have a weight loss of 12 lbs in past year.   2. DM2. Worsened control with A1c 11.2 today. She stopped taking metformin. Has issues with taking multiple medications sometimes, but wants to restart the metformin. Is taking NPH insulin 38 and 18 units daily. Denies any hypoglycemic events. Does have weight loss 12 lbs, fatigue present. Denies foot ulcers or numbness.  3. Vaginal discharge. Seen previously for this. She has a fishy odor and intermittent white discharge. Tried topical antifungal that didn't work. States that flagyl cleared this up previously and would like an rx for this today.  Review of Systems See HPI otherwise negative.  reports that she has never smoked. She does not have any smokeless tobacco history on file.     Objective:   Physical Exam  Constitutional: She is oriented to person, place, and time. She appears well-developed and well-nourished. No distress.  HENT:  Head: Normocephalic and atraumatic.  Mouth/Throat: Oropharynx is clear and moist.  Eyes: EOM are normal. Pupils are equal, round, and reactive to light.  Neck: Neck supple. No thyromegaly present.  Cardiovascular: Normal rate, regular rhythm and normal heart sounds.   Pulmonary/Chest: Effort normal and breath sounds normal. No respiratory distress. She has no wheezes. She  has no rales.  Musculoskeletal: She exhibits no edema and no tenderness.       Spinous processes nontender. Lumbar ROM grossly intact, pain with extreme flexion.  Negative SL testing. Symmetric patellar and ankle reflexes. Sensation to light touch intact throughout extremities. Normal gait.   Lymphadenopathy:    She has no cervical adenopathy.  Neurological: She is alert and oriented to person, place, and time. She has normal reflexes. She exhibits normal muscle tone. Coordination normal.  Skin: No rash noted. She is not diaphoretic.  Psychiatric: She has a normal mood and affect.       Assessment & Plan:

## 2012-11-20 NOTE — Addendum Note (Signed)
Addended by: [place], Kelcy Baeten on: 11/20/2012 01:00 PM   Modules accepted: Orders

## 2012-11-20 NOTE — Assessment & Plan Note (Signed)
Above goal. My first time to meet patient, might recommend increase in metoprolol or HCTZ next visit. Labs today. F/u one month.

## 2012-11-20 NOTE — Patient Instructions (Addendum)
Diabetes is not controlled well. Start taking metformin daily for diabetes. You may notice GI side effects that are not dangerous. Please stop taking advil/motrin/aleve every day. This is dangerous for your kidneys. Important to see the back doctor, will make referral. You might have an infection and need an MRI. You can use vicodin at night for pain. You can use tylenol up to 2000 g daily for pain also. Make appointment for check up in one month or sooner if needed.

## 2012-11-20 NOTE — Assessment & Plan Note (Signed)
Worsening chronic pain. Concern is for possible discitis on CT scan obtained in ED in August (she left AMA from ED). Patient has refused to get MRI that was scheduled multiple times. She prefers to return to her neurosurgeon Dr. Franky Macho for further testing which I think is reasonable. She does not display any signs of systemic infection or neurologic deficits, so will defer to NSG for further workup and treatment. I have given short course of vicodin to last until her appointment and advised to avoid high dose scheduled NSAIDs in setting of HTN, DM2 and high risk for nephropathy.

## 2012-11-23 ENCOUNTER — Telehealth: Payer: Self-pay | Admitting: Family Medicine

## 2012-11-23 NOTE — Telephone Encounter (Signed)
MRI scheduled at [facility] 11/24/12 @ 11:00am.  Patient notified and instructed to arrive at 10:45am.  Referral/records have been faxed to [facility].  They will contact patient with appointment.  Ileana Ladd

## 2012-11-23 NOTE — Telephone Encounter (Signed)
Patient has elevated inflammatory markers still concerning for possible discitis seen on CT scan back in August. Patient never did f/u for MRI that was ordered and has not displayed neurologic or systemic signs. Spoke with her regarding this, and will try to reschedule the MRI and get her in with NSG this week.

## 2012-11-24 ENCOUNTER — Ambulatory Visit (HOSPITAL_COMMUNITY): Admission: RE | Admit: 2012-11-24 | Payer: Federal, State, Local not specified - PPO | Source: Ambulatory Visit

## 2012-11-26 ENCOUNTER — Ambulatory Visit (HOSPITAL_COMMUNITY)
Admission: RE | Admit: 2012-11-26 | Discharge: 2012-11-26 | Disposition: A | Discharge status: Home / Self Care | Payer: Federal, State, Local not specified - PPO | Source: Ambulatory Visit | Attending: Family Medicine | Admitting: Family Medicine

## 2012-11-26 ENCOUNTER — Telehealth: Payer: Self-pay | Admitting: Family Medicine

## 2012-11-26 DIAGNOSIS — M549 Dorsalgia, unspecified: Secondary | ICD-10-CM

## 2012-11-26 DIAGNOSIS — M545 Low back pain, unspecified: Secondary | ICD-10-CM | POA: Insufficient documentation

## 2012-11-26 DIAGNOSIS — M519 Unspecified thoracic, thoracolumbar and lumbosacral intervertebral disc disorder: Secondary | ICD-10-CM | POA: Insufficient documentation

## 2012-11-26 MED ORDER — GADOBENATE DIMEGLUMINE 529 MG/ML IV SOLN
13.0000 mL | Freq: Once | INTRAVENOUS | Status: AC
  Administered 2012-11-26: 13 mL via INTRAVENOUS

## 2012-11-26 NOTE — Telephone Encounter (Signed)
MRI results showing possible infection vs neoplastic process. Advised patient needs PPD placed and neurosurgeon visit. She states this is scheduled on Jan 2. She states her pain is improved currently and feels well. She agrees to make appt to get PPD and notify MD of any changes. Will fax MRI report to [facility] for consult.

## 2012-11-30 ENCOUNTER — Telehealth: Payer: Self-pay | Admitting: Family Medicine

## 2012-11-30 ENCOUNTER — Other Ambulatory Visit: Payer: Self-pay | Admitting: Family Medicine

## 2012-11-30 DIAGNOSIS — M549 Dorsalgia, unspecified: Secondary | ICD-10-CM

## 2012-11-30 MED ORDER — HYDROCODONE-ACETAMINOPHEN 7.5-500 MG PO TABS: 1.0000 | ORAL_TABLET | Freq: Four times a day (QID) | ORAL | Status: DC | PRN

## 2012-11-30 NOTE — Telephone Encounter (Addendum)
Will forward message to Dr. Cristal Ford. Paged Dr. Cristal Ford.

## 2012-11-30 NOTE — Telephone Encounter (Signed)
Rx was called in earlier today to [facility] at 10:17 am.  Called Rx in again.  Patient will check with pharmacy and call our office back before 5:00 pm today if they do not have Rx.  Gaylene Brooks, RN

## 2012-11-30 NOTE — Telephone Encounter (Signed)
Pt has one hydrocodone left and is asking for another refill today- -she is coming in at 2:45 for a PPD

## 2012-11-30 NOTE — Telephone Encounter (Signed)
Please call in rx I ordered. Needs OV prior to refills.

## 2012-11-30 NOTE — Telephone Encounter (Signed)
[facility] [address] [facility] don't [facility] the Rx for the Hydrocodone.

## 2012-11-30 NOTE — Telephone Encounter (Signed)
[phone]  Patient has an appt with Dr. Cristal Ford on 12/16/12.  Gaylene Brooks, RN

## 2012-12-12 ENCOUNTER — Other Ambulatory Visit: Payer: Self-pay | Admitting: Family Medicine

## 2012-12-14 ENCOUNTER — Ambulatory Visit (INDEPENDENT_AMBULATORY_CARE_PROVIDER_SITE_OTHER): Payer: Federal, State, Local not specified - PPO | Admitting: *Deleted

## 2012-12-14 DIAGNOSIS — Z111 Encounter for screening for respiratory tuberculosis: Secondary | ICD-10-CM

## 2012-12-16 ENCOUNTER — Ambulatory Visit (INDEPENDENT_AMBULATORY_CARE_PROVIDER_SITE_OTHER): Payer: Federal, State, Local not specified - PPO | Admitting: Family Medicine

## 2012-12-16 ENCOUNTER — Encounter: Payer: Self-pay | Admitting: Family Medicine

## 2012-12-16 VITALS — BP 102/70 | HR 90 | Temp 98.3°F | Wt 127.0 lb

## 2012-12-16 DIAGNOSIS — E1165 Type 2 diabetes mellitus with hyperglycemia: Secondary | ICD-10-CM

## 2012-12-16 DIAGNOSIS — M549 Dorsalgia, unspecified: Secondary | ICD-10-CM

## 2012-12-16 DIAGNOSIS — M792 Neuralgia and neuritis, unspecified: Secondary | ICD-10-CM

## 2012-12-16 DIAGNOSIS — IMO0002 Reserved for concepts with insufficient information to code with codable children: Secondary | ICD-10-CM

## 2012-12-16 DIAGNOSIS — IMO0001 Reserved for inherently not codable concepts without codable children: Secondary | ICD-10-CM

## 2012-12-16 LAB — HIV ANTIBODY (ROUTINE TESTING W REFLEX): HIV: NONREACTIVE

## 2012-12-16 LAB — TB SKIN TEST
Induration: 0 mm
TB Skin Test: NEGATIVE

## 2012-12-16 MED ORDER — OXYCODONE-ACETAMINOPHEN 5-325 MG PO TABS: ORAL_TABLET | ORAL | Status: DC

## 2012-12-16 MED ORDER — GABAPENTIN 300 MG PO CAPS: 300.0000 mg | ORAL_CAPSULE | Freq: Two times a day (BID) | ORAL | Status: DC

## 2012-12-16 MED ORDER — METFORMIN HCL 500 MG PO TABS: 500.0000 mg | ORAL_TABLET | Freq: Two times a day (BID) | ORAL | Status: DC

## 2012-12-16 NOTE — Assessment & Plan Note (Signed)
Concerning with weight loss, elevated inflammatory markers and inflammation on MRI. Agree with biopsy plans. Her PPD is negative, will check HIV today. No PE signs of nerve impingement. Will treat pain with percocet short term pending work up. Discussed risks of lethargy. She will f/u in one month or sooner if needed after her biopsy.

## 2012-12-16 NOTE — Patient Instructions (Addendum)
Get the biopsy done. Will check HIV today. You can start neurontin for nerve pain and increase to twice daily if tolerated. Use oxycodone only for severe pain at night, not everyday. Increase metformin to twice daily or take stool softener for constipation. Make appointment in one month or sooner if needed.

## 2012-12-16 NOTE — Assessment & Plan Note (Signed)
Advised to increase metformin to BID. F/u one month.

## 2012-12-16 NOTE — Assessment & Plan Note (Signed)
Suspect thigh pains are neuropathic with tingling, likely secondary to inflammatory process in lumbar area. Trial of neurontin with increase as directed. F/u in one month.

## 2012-12-16 NOTE — Progress Notes (Signed)
  Subjective:    Patient ID: Destiny Morton, female    DOB: [birth year], 57 y.o.   MRN: [id number]  HPI  1. F/u back pain with MRI abnormalities. Seen Dr. Franky Macho for evaluation of inflammation surrounding lumbar spine. She plans to undergo biopsy with working dx infectious vs malignancy. Her pain is stable. Still has LBP that limits her activities some days. Her main complaint today is bilateral thigh pain that is tingly in nature. She is using OTC tylenol and vicodin without much effect.  She denies falls, numbness, incontinence, fevers, chills.  She has lost about 10 lbs since last visit one month ago. States the pain limits her appetite.  Her PPD is negative today.  2. DM2. Compliant with insulin and started metformin. States her CBGs are slightly improved with metformin.   Review of Systems See HPI otherwise negative.  reports that she has never smoked. She does not have any smokeless tobacco history on file.     Objective:   Physical Exam  Vitals reviewed. Constitutional: She is oriented to person, place, and time. She appears well-developed. No distress.  HENT:  Head: Normocephalic and atraumatic.  Eyes: EOM are normal.  Neck: Neck supple.  Cardiovascular: Normal rate, regular rhythm and normal heart sounds.   No murmur heard. Pulmonary/Chest: Effort normal and breath sounds normal.  Musculoskeletal: She exhibits no edema.       No spinous TTP. Decreased flexion lumbar. Normal gait. 5/5 distal LE strength. Gross sensation intact.   Neurological: She is alert and oriented to person, place, and time. No cranial nerve deficit.  Skin: No rash noted. She is not diaphoretic.       Left forearm 1 mm TB reaction.  Psychiatric: She has a normal mood and affect.          Assessment & Plan:

## 2012-12-17 ENCOUNTER — Telehealth: Payer: Self-pay | Admitting: Family Medicine

## 2012-12-17 ENCOUNTER — Encounter: Payer: Self-pay | Admitting: Family Medicine

## 2012-12-17 NOTE — Telephone Encounter (Signed)
Is asking if she can take oxycodone along with tylenol

## 2012-12-18 NOTE — Telephone Encounter (Signed)
Spoke with patient. She is only taking one percocet daily. Discussed limit of 2000-3000 mg tyelnol daily so she can use 650 mg three times daily as needed. Discussed trial of neurontin again (she got med and read side effects, decided to wait). Advised to f/u in next 2-3 weeks. I encouraged her to call Dr. Franky Macho if doesn't hear back in next 1 week.

## 2012-12-21 ENCOUNTER — Other Ambulatory Visit: Payer: Self-pay | Admitting: Family Medicine

## 2012-12-21 DIAGNOSIS — M549 Dorsalgia, unspecified: Secondary | ICD-10-CM

## 2012-12-21 MED ORDER — OXYCODONE-ACETAMINOPHEN 5-325 MG PO TABS: ORAL_TABLET | ORAL | Status: DC

## 2012-12-21 NOTE — Telephone Encounter (Signed)
Patient is calling for a refill on her Oxycodone.  She is completely out of this med.

## 2012-12-21 NOTE — Telephone Encounter (Signed)
Called pt and unable to lvm. Should she call back please inform pt that rx is up front for p/u and that in order to obtain anymore refills she will need to be seen.Loralee Pacas Manteno

## 2012-12-21 NOTE — Telephone Encounter (Signed)
Will print and leave up front for pick up. No refills until office visit. Please notify patient.

## 2012-12-29 ENCOUNTER — Other Ambulatory Visit: Payer: Self-pay | Admitting: Neurosurgery

## 2012-12-29 DIAGNOSIS — M869 Osteomyelitis, unspecified: Secondary | ICD-10-CM

## 2013-01-01 ENCOUNTER — Ambulatory Visit
Admission: RE | Admit: 2013-01-01 | Discharge: 2013-01-01 | Disposition: A | Discharge status: Home / Self Care | Payer: Federal, State, Local not specified - PPO | Source: Ambulatory Visit | Attending: Neurosurgery | Admitting: Neurosurgery

## 2013-01-01 ENCOUNTER — Other Ambulatory Visit: Payer: Self-pay | Admitting: Neurosurgery

## 2013-01-01 VITALS — BP 164/93 | HR 97

## 2013-01-01 DIAGNOSIS — M869 Osteomyelitis, unspecified: Secondary | ICD-10-CM

## 2013-01-01 MED ORDER — FENTANYL CITRATE 0.05 MG/ML IJ SOLN
25.0000 ug | INTRAMUSCULAR | Status: DC | PRN
  Administered 2013-01-01 (×3): 25 ug via INTRAVENOUS

## 2013-01-01 MED ORDER — SODIUM CHLORIDE 0.9 % IV SOLN
Freq: Once | INTRAVENOUS | Status: AC
  Administered 2013-01-01: 09:00:00 via INTRAVENOUS

## 2013-01-01 MED ORDER — MIDAZOLAM HCL 2 MG/2ML IJ SOLN
1.0000 mg | INTRAMUSCULAR | Status: DC | PRN
  Administered 2013-01-01: 1 mg via INTRAVENOUS
  Administered 2013-01-01: 0.5 mg via INTRAVENOUS

## 2013-01-01 NOTE — Progress Notes (Signed)
Pt in nursing area. She is awake and alert, skin is warm and dry. Pt tolerated procedure well.

## 2013-01-01 NOTE — Progress Notes (Signed)
Pt got uncomfortable with placement of the needle and did allow me to give very small amounts of versed and fentanyl.

## 2013-01-04 LAB — BODY FLUID CULTURE: Gram Stain: NONE SEEN

## 2013-01-06 ENCOUNTER — Ambulatory Visit: Payer: Federal, State, Local not specified - PPO | Admitting: Family Medicine

## 2013-01-06 LAB — ANAEROBIC CULTURE
Gram Stain: NONE SEEN
Gram Stain: NONE SEEN
Gram Stain: NONE SEEN

## 2013-01-15 ENCOUNTER — Emergency Department (HOSPITAL_COMMUNITY)
Admission: EM | Admit: 2013-01-15 | Discharge: 2013-01-16 | Disposition: A | Discharge status: Home Health | Payer: Federal, State, Local not specified - PPO | Attending: Emergency Medicine | Admitting: Emergency Medicine

## 2013-01-15 DIAGNOSIS — Z79899 Other long term (current) drug therapy: Secondary | ICD-10-CM | POA: Insufficient documentation

## 2013-01-15 DIAGNOSIS — Z794 Long term (current) use of insulin: Secondary | ICD-10-CM | POA: Insufficient documentation

## 2013-01-15 DIAGNOSIS — Z7982 Long term (current) use of aspirin: Secondary | ICD-10-CM | POA: Insufficient documentation

## 2013-01-15 DIAGNOSIS — R5381 Other malaise: Secondary | ICD-10-CM | POA: Insufficient documentation

## 2013-01-15 DIAGNOSIS — E119 Type 2 diabetes mellitus without complications: Secondary | ICD-10-CM | POA: Insufficient documentation

## 2013-01-15 DIAGNOSIS — M25559 Pain in unspecified hip: Secondary | ICD-10-CM | POA: Insufficient documentation

## 2013-01-15 DIAGNOSIS — N390 Urinary tract infection, site not specified: Secondary | ICD-10-CM | POA: Insufficient documentation

## 2013-01-15 DIAGNOSIS — M549 Dorsalgia, unspecified: Secondary | ICD-10-CM | POA: Insufficient documentation

## 2013-01-15 DIAGNOSIS — I10 Essential (primary) hypertension: Secondary | ICD-10-CM | POA: Insufficient documentation

## 2013-01-15 DIAGNOSIS — R509 Fever, unspecified: Secondary | ICD-10-CM | POA: Insufficient documentation

## 2013-01-15 DIAGNOSIS — R5383 Other fatigue: Secondary | ICD-10-CM | POA: Insufficient documentation

## 2013-01-15 DIAGNOSIS — M25519 Pain in unspecified shoulder: Secondary | ICD-10-CM | POA: Insufficient documentation

## 2013-01-15 NOTE — ED Notes (Addendum)
Inc. Failure to thrive x 2 several months; been here for x-rays, blood work, Tour manager and all is normal. Non productive cough.

## 2013-01-16 LAB — CBC WITH DIFFERENTIAL/PLATELET
Basophils Absolute: 0 10*3/uL (ref 0.0–0.1)
Basophils Relative: 0 % (ref 0–1)
Eosinophils Absolute: 0 10*3/uL (ref 0.0–0.7)
Eosinophils Relative: 0 % (ref 0–5)
HCT: 33.2 % — ABNORMAL LOW (ref 36.0–46.0)
Hemoglobin: 11.1 g/dL — ABNORMAL LOW (ref 12.0–15.0)
Lymphocytes Relative: 18 % (ref 12–46)
Lymphs Abs: 1.9 10*3/uL (ref 0.7–4.0)
MCH: 27.1 pg (ref 26.0–34.0)
MCHC: 33.4 g/dL (ref 30.0–36.0)
MCV: 81 fL (ref 78.0–100.0)
Monocytes Absolute: 0.9 10*3/uL (ref 0.1–1.0)
Monocytes Relative: 9 % (ref 3–12)
Neutro Abs: 7.4 10*3/uL (ref 1.7–7.7)
Neutrophils Relative %: 73 % (ref 43–77)
Platelets: 283 10*3/uL (ref 150–400)
RBC: 4.1 MIL/uL (ref 3.87–5.11)
RDW: 13.2 % (ref 11.5–15.5)
WBC: 10.2 10*3/uL (ref 4.0–10.5)

## 2013-01-16 LAB — URINALYSIS, ROUTINE W REFLEX MICROSCOPIC
Bilirubin Urine: NEGATIVE
Glucose, UA: >1000 mg/dL — AB
Hgb urine dipstick: NEGATIVE
Ketones, ur: NEGATIVE mg/dL
Leukocytes, UA: NEGATIVE
Nitrite: NEGATIVE
Protein, ur: 30 mg/dL — AB
Specific Gravity, Urine: 1.035 — ABNORMAL HIGH (ref 1.005–1.030)
Urobilinogen, UA: 0.2 mg/dL (ref 0.0–1.0)
pH: 5.5 (ref 5.0–8.0)

## 2013-01-16 LAB — BASIC METABOLIC PANEL
BUN: 20 mg/dL (ref 6–23)
CO2: 28 mEq/L (ref 19–32)
Calcium: 9.5 mg/dL (ref 8.4–10.5)
Chloride: 94 mEq/L — ABNORMAL LOW (ref 96–112)
Creatinine, Ser: 0.82 mg/dL (ref 0.50–1.10)
GFR calc Af Amer: 90 mL/min — ABNORMAL LOW (ref >90)
GFR calc non Af Amer: 77 mL/min — ABNORMAL LOW (ref >90)
Glucose, Bld: 351 mg/dL — ABNORMAL HIGH (ref 70–99)
Potassium: 4.6 mEq/L (ref 3.5–5.1)
Sodium: 131 mEq/L — ABNORMAL LOW (ref 135–145)

## 2013-01-16 LAB — URINE MICROSCOPIC-ADD ON

## 2013-01-16 LAB — GLUCOSE, CAPILLARY: Glucose-Capillary: 247 mg/dL — ABNORMAL HIGH (ref 70–99)

## 2013-01-16 MED ORDER — DEXTROSE 5 % IV SOLN
1.0000 g | Freq: Once | INTRAVENOUS | Status: DC
  Filled 2013-01-16: qty 10

## 2013-01-16 MED ORDER — CEPHALEXIN 250 MG PO CAPS
500.0000 mg | ORAL_CAPSULE | Freq: Once | ORAL | Status: AC
  Administered 2013-01-16: 500 mg via ORAL
  Filled 2013-01-16: qty 2

## 2013-01-16 MED ORDER — CEPHALEXIN 500 MG PO CAPS: 500.0000 mg | ORAL_CAPSULE | Freq: Four times a day (QID) | ORAL | Status: DC

## 2013-01-16 NOTE — ED Notes (Signed)
Pt has been triaged and assessed. Vitals have been taken during downtime. See downtime charting.

## 2013-01-17 LAB — URINE CULTURE
Colony Count: NO GROWTH
Culture: NO GROWTH

## 2013-01-20 ENCOUNTER — Ambulatory Visit: Payer: Federal, State, Local not specified - PPO | Admitting: Family Medicine

## 2013-01-28 ENCOUNTER — Encounter: Payer: Self-pay | Admitting: Family Medicine

## 2013-01-28 ENCOUNTER — Ambulatory Visit (INDEPENDENT_AMBULATORY_CARE_PROVIDER_SITE_OTHER): Payer: Federal, State, Local not specified - PPO | Admitting: Family Medicine

## 2013-01-28 ENCOUNTER — Other Ambulatory Visit (HOSPITAL_COMMUNITY)
Admission: RE | Admit: 2013-01-28 | Discharge: 2013-01-28 | Disposition: A | Discharge status: Home / Self Care | Payer: Federal, State, Local not specified - PPO | Source: Ambulatory Visit | Attending: Family Medicine | Admitting: Family Medicine

## 2013-01-28 VITALS — BP 116/75 | HR 102 | Temp 98.0°F | Ht 67.0 in | Wt 125.0 lb

## 2013-01-28 DIAGNOSIS — N898 Other specified noninflammatory disorders of vagina: Secondary | ICD-10-CM

## 2013-01-28 DIAGNOSIS — IMO0001 Reserved for inherently not codable concepts without codable children: Secondary | ICD-10-CM

## 2013-01-28 DIAGNOSIS — M792 Neuralgia and neuritis, unspecified: Secondary | ICD-10-CM

## 2013-01-28 DIAGNOSIS — R300 Dysuria: Secondary | ICD-10-CM

## 2013-01-28 DIAGNOSIS — IMO0002 Reserved for concepts with insufficient information to code with codable children: Secondary | ICD-10-CM

## 2013-01-28 DIAGNOSIS — M549 Dorsalgia, unspecified: Secondary | ICD-10-CM

## 2013-01-28 DIAGNOSIS — Z113 Encounter for screening for infections with a predominantly sexual mode of transmission: Secondary | ICD-10-CM | POA: Insufficient documentation

## 2013-01-28 DIAGNOSIS — E1165 Type 2 diabetes mellitus with hyperglycemia: Secondary | ICD-10-CM

## 2013-01-28 LAB — GLUCOSE, CAPILLARY: Glucose-Capillary: 322 mg/dL — ABNORMAL HIGH (ref 70–99)

## 2013-01-28 MED ORDER — GABAPENTIN 300 MG PO CAPS: 300.0000 mg | ORAL_CAPSULE | Freq: Two times a day (BID) | ORAL | Status: DC

## 2013-01-28 MED ORDER — CAPSAICIN 0.075 % EX CREA: TOPICAL_CREAM | Freq: Three times a day (TID) | CUTANEOUS | Status: DC

## 2013-01-28 MED ORDER — HYDROCODONE-ACETAMINOPHEN 5-325 MG PO TABS: 1.0000 | ORAL_TABLET | Freq: Four times a day (QID) | ORAL | Status: DC | PRN

## 2013-01-28 MED ORDER — INSULIN NPH (HUMAN) (ISOPHANE) 100 UNIT/ML ~~LOC~~ SUSP: 18.0000 [IU] | Freq: Two times a day (BID) | SUBCUTANEOUS | Status: DC

## 2013-01-28 NOTE — Progress Notes (Signed)
  Subjective:    Patient ID: Destiny Morton, female    DOB: January 26, 1956, 57 y.o.   MRN: [id number]  HPI  1. F/u back pain. Patient underwent biopsy of paraspinal psoas fluid collection on Jan 21st by Dr. Franky Macho. She reports that there was no sign of infection reported, indeed the cultures showed no growth, but Gram stain reported rare strep with WBCs. She was told to follow up in NSG clinic and some labs were drawn about 2 weeks ago.  Now she is still having back pain that is worse at night. She reports bilateral burning leg pains to her toes. Overall she reports her back pain is mildly improved since August, because she couldn't walk before and now she is ambulatory. She only tried gabapentin twice. She reports that vicodin helped, but hasn't gotten an rx recently and ran out.  Denies incontinence.  2. UTI. Was recently treated for UTI in ED on 2/8 with keflex. She had fever and dysuria at that time, which has resolved. She denies difficulty emptying her bladder, hematuria, dysuria but states that something "doesn't feel right" in her bladder. Denies fever, chills, abdominal pain.   3. DM2. Uncontrolled. Reports CBGs in high 200-300 range. Compliant with insulin NPH. She endorses polyuria, polydipsia, fatigue. She has lost 25 lbs in the past year.  Lab Results  Component Value Date   HGBA1C 11.2 11/20/2012   Review of Systems See HPI otherwise negative.  reports that she has never smoked. She does not have any smokeless tobacco history on file.     Objective:   Physical Exam  Vitals reviewed. Constitutional: She is oriented to person, place, and time. She appears well-developed. No distress.  Thin female  HENT:  Head: Normocephalic and atraumatic.  Mouth/Throat: Oropharynx is clear and moist.  Neck: Normal range of motion. Neck supple.  Cardiovascular: Normal rate, regular rhythm and normal heart sounds.   No murmur heard. Pulmonary/Chest: Effort normal and breath sounds normal. No  respiratory distress. She has no wheezes.  Abdominal: Soft. Bowel sounds are normal. She exhibits no distension. There is no tenderness.  No CVA tenderness.  Musculoskeletal: She exhibits no edema and no tenderness.  Normal gait. Ascends table slowly. Sensation to light touch intact B LE and UE.  Examined biopsy site appears to be healing well without skin erythema or tenderness.  Neurological: She is alert and oriented to person, place, and time.  Skin: No rash noted.  Psychiatric:  Slightly anxious        Assessment & Plan:

## 2013-01-28 NOTE — Patient Instructions (Addendum)
Take the gabapentin at night (1-2 tablets). You can use it during day if doesn't make you sleepy. I want to know if you get worse, so I will not keep refilling your hydrocodone. Increase your insulin by 3 units at dinner time. Take glucerna or protein drinks. Make appointment in 2-4 weeks for check up. Try capsaicin ointment on legs.

## 2013-01-28 NOTE — Assessment & Plan Note (Signed)
Per patient this is slightly improved but still significantly impacting her function and sleep. She has no neurologic deficits on exam today. 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 She had biopsy and further studies by Dr. Franky Macho, will attempt to get most recent records. In setting of elevated inflammatory markers, weight loss this needs to be monitored closely in addition to just provided analgesia. Discussed with patient will not receive long term narcotics. She will resume gabapentin qhs, can use vicodin prn at night (this is my last rx). F/u in 2-3 weeks.

## 2013-01-28 NOTE — Assessment & Plan Note (Addendum)
Perhaps this could be the cause of her weight loss and fatigue. Current hyperglycemia could have been worsened by recent UTI or stress with her back pain, but has not been controlled for quite some time with A1c in 10-12 range since 2008. Advised to increase her insulin evening dose by 3 units (total 18)She has been on NPH for years. Unsure with her insurance why she hasn't previously been transitioned to basal-bolus insulin, but since she is mainly here for f/u of back pain and UTI today, will postpone this significant transition for now. F/u in 2 weeks or sooner if needed.

## 2013-01-28 NOTE — Assessment & Plan Note (Signed)
Urine culture negative from ED visit. Will check urine GC/Ch to rule out urethritis.

## 2013-01-29 LAB — FUNGUS CULTURE W SMEAR: Smear Result: NONE SEEN

## 2013-02-01 ENCOUNTER — Other Ambulatory Visit: Payer: Self-pay | Admitting: Family Medicine

## 2013-02-01 ENCOUNTER — Telehealth: Payer: Self-pay | Admitting: Family Medicine

## 2013-02-01 MED ORDER — GABAPENTIN 100 MG PO CAPS: 100.0000 mg | ORAL_CAPSULE | Freq: Three times a day (TID) | ORAL | Status: DC

## 2013-02-01 NOTE — Telephone Encounter (Signed)
Returned call to patient and informed of message from Dr. Cristal Ford.  Gaylene Brooks, RN

## 2013-02-01 NOTE — Telephone Encounter (Signed)
I have sent 100 mg tablets to pharmacy. Please let patient know this is acceptable to try TID or qhs only.

## 2013-02-01 NOTE — Telephone Encounter (Signed)
Pt states that the gabapentin is too strong and would like to reduce it to 100mg  instead.  [facility] [address]

## 2013-02-01 NOTE — Telephone Encounter (Signed)
Patient reports she took gabapentin for 3 days and felt very dizzy. She stopped the medication and now feels better. Wonders if she should try a lower dose. Really helped the pain. Will forward to MD.

## 2013-02-08 ENCOUNTER — Telehealth: Payer: Self-pay | Admitting: Family Medicine

## 2013-02-08 NOTE — Telephone Encounter (Addendum)
Spoke with patient and she cannot afford $38.00 and wants something else sent in that insurance will cover. Advised will forward message to Dr. Cristal Ford.

## 2013-02-08 NOTE — Telephone Encounter (Signed)
Pt is calling asking for nurse to call [facility] [address] to see if they have rec'd script for capsicum - they cannot find the orders for this or they don't know if they can get it and they told her to call her doctors office

## 2013-02-08 NOTE — Telephone Encounter (Addendum)
I called pharmacy and they do have RX on hold. Will cost $38.00. Not covered by insurance because basically and OTC med. . PA not needed.    Attempted call back to patient . Phone busy.

## 2013-02-09 NOTE — Telephone Encounter (Signed)
Patient notified

## 2013-02-09 NOTE — Telephone Encounter (Signed)
Spoke to patient related message she voiced understanding. Jenine Krisher, [name]

## 2013-02-09 NOTE — Telephone Encounter (Signed)
She can also shop around for better price on OTC capsaicin cream/ointment. Please let her know.

## 2013-02-09 NOTE — Telephone Encounter (Signed)
Please advise patient the next cheapest thing to try is probably aspercreme. These are OTC medications. NO rx required.

## 2013-02-13 LAB — AFB CULTURE WITH SMEAR (NOT AT ARMC): Acid Fast Smear: NONE SEEN

## 2013-02-17 ENCOUNTER — Ambulatory Visit (INDEPENDENT_AMBULATORY_CARE_PROVIDER_SITE_OTHER): Payer: Federal, State, Local not specified - PPO | Admitting: Family Medicine

## 2013-02-17 VITALS — BP 132/76 | HR 107 | Temp 98.0°F | Ht 67.0 in | Wt 121.0 lb

## 2013-02-17 DIAGNOSIS — M549 Dorsalgia, unspecified: Secondary | ICD-10-CM

## 2013-02-17 DIAGNOSIS — IMO0002 Reserved for concepts with insufficient information to code with codable children: Secondary | ICD-10-CM

## 2013-02-17 DIAGNOSIS — E119 Type 2 diabetes mellitus without complications: Secondary | ICD-10-CM

## 2013-02-17 DIAGNOSIS — E1165 Type 2 diabetes mellitus with hyperglycemia: Secondary | ICD-10-CM

## 2013-02-17 DIAGNOSIS — IMO0001 Reserved for inherently not codable concepts without codable children: Secondary | ICD-10-CM

## 2013-02-17 DIAGNOSIS — M792 Neuralgia and neuritis, unspecified: Secondary | ICD-10-CM

## 2013-02-17 DIAGNOSIS — R300 Dysuria: Secondary | ICD-10-CM

## 2013-02-17 LAB — POCT URINALYSIS DIPSTICK
Blood, UA: NEGATIVE
Glucose, UA: 100
Leukocytes, UA: NEGATIVE
Nitrite, UA: NEGATIVE
Protein, UA: 30
Spec Grav, UA: 1.025
Urobilinogen, UA: 0.2
pH, UA: 5.5

## 2013-02-17 LAB — COMPREHENSIVE METABOLIC PANEL
ALT: 10 U/L (ref 0–35)
AST: 11 U/L (ref 0–37)
Albumin: 3.8 g/dL (ref 3.5–5.2)
Alkaline Phosphatase: 81 U/L (ref 39–117)
BUN: 13 mg/dL (ref 6–23)
CO2: 25 mEq/L (ref 19–32)
Calcium: 9.6 mg/dL (ref 8.4–10.5)
Chloride: 98 mEq/L (ref 96–112)
Creat: 0.85 mg/dL (ref 0.50–1.10)
Glucose, Bld: 226 mg/dL — ABNORMAL HIGH (ref 70–99)
Potassium: 3.8 mEq/L (ref 3.5–5.3)
Sodium: 137 mEq/L (ref 135–145)
Total Bilirubin: 0.4 mg/dL (ref 0.3–1.2)
Total Protein: 8 g/dL (ref 6.0–8.3)

## 2013-02-17 LAB — POCT UA - MICROSCOPIC ONLY

## 2013-02-17 LAB — CBC WITH DIFFERENTIAL/PLATELET
Basophils Absolute: 0 10*3/uL (ref 0.0–0.1)
Basophils Relative: 0 % (ref 0–1)
Eosinophils Absolute: 0 10*3/uL (ref 0.0–0.7)
Eosinophils Relative: 1 % (ref 0–5)
HCT: 34.3 % — ABNORMAL LOW (ref 36.0–46.0)
Hemoglobin: 11.2 g/dL — ABNORMAL LOW (ref 12.0–15.0)
Lymphocytes Relative: 24 % (ref 12–46)
Lymphs Abs: 1.8 10*3/uL (ref 0.7–4.0)
MCH: 26.3 pg (ref 26.0–34.0)
MCHC: 32.7 g/dL (ref 30.0–36.0)
MCV: 80.5 fL (ref 78.0–100.0)
Monocytes Absolute: 0.7 10*3/uL (ref 0.1–1.0)
Monocytes Relative: 10 % (ref 3–12)
Neutro Abs: 4.9 10*3/uL (ref 1.7–7.7)
Neutrophils Relative %: 65 % (ref 43–77)
Platelets: 382 10*3/uL (ref 150–400)
RBC: 4.26 MIL/uL (ref 3.87–5.11)
RDW: 14.4 % (ref 11.5–15.5)
WBC: 7.5 10*3/uL (ref 4.0–10.5)

## 2013-02-17 LAB — POCT SEDIMENTATION RATE: POCT SED RATE: 88 mm/hr — AB (ref 0–22)

## 2013-02-17 LAB — C-REACTIVE PROTEIN: CRP: 1.9 mg/dL — ABNORMAL HIGH (ref <0.60)

## 2013-02-17 LAB — POCT GLYCOSYLATED HEMOGLOBIN (HGB A1C): Hemoglobin A1C: 11

## 2013-02-17 LAB — GLUCOSE, CAPILLARY: Glucose-Capillary: 262 mg/dL — ABNORMAL HIGH (ref 70–99)

## 2013-02-17 MED ORDER — INSULIN NPH (HUMAN) (ISOPHANE) 100 UNIT/ML ~~LOC~~ SUSP: 18.0000 [IU] | Freq: Two times a day (BID) | SUBCUTANEOUS | Status: DC

## 2013-02-17 MED ORDER — HYDROCODONE-ACETAMINOPHEN 5-325 MG PO TABS: 1.0000 | ORAL_TABLET | Freq: Four times a day (QID) | ORAL | Status: DC | PRN

## 2013-02-17 NOTE — Assessment & Plan Note (Signed)
Check UA, urine culture to rule out infection 

## 2013-02-17 NOTE — Assessment & Plan Note (Signed)
Patient resistant to change insulin to lantus today. I encouraged her to increase NPH by another 3 units and make appointment with Dr. Louanne Belton clinic to discuss changing to longer acting insulin. Again, I am unsure if weight loss is from poorly controlled DM2 or more related to her back pain. F/u in one month.

## 2013-02-17 NOTE — Assessment & Plan Note (Signed)
Pain is worsened again. Concern with weight loss and findings on MRI last year. No neurologic signs of cord compression today, but feel pain is out of proportion to exam. We may pursuit repeat imaging pending plans by her neurosurgeon. I have called Neurosurgeon Dr. Joesphine Bare office and request a call back, wish to discuss possible etiology of this mass and plans for work up after her biopsy did not reveal any evidence of infection. Repeat labs today. Refill #15 vicodin. F/u in one month or sooner if worsens.

## 2013-02-17 NOTE — Assessment & Plan Note (Signed)
Continue using neurontin and capsaicin cream.

## 2013-02-17 NOTE — Progress Notes (Signed)
  Subjective:    Patient ID: Destiny Morton, female    DOB: 04-03-1956, 57 y.o.   MRN: [id number]  Dysuria     1. Low back pain. Still is severe, rating 10/10 today. Not improving. Using up to 8 tylenol tablets daily, neurontin lower dose. zostrix cream not helping the associated leg burning/tingling. She tried vicodin at night which helps somewhat.  Has not followed up with Dr. Franky Macho, and patient wonders if she could receive an injection or some other interventional pain management.   She has lost another 4 lbs, states she has trouble eating due to pain.  No falls, leg weakness, rash, swelling, trauma, falls.   2. DM2. Uncontrolled. Taking NPH insulin, no low blood sugar episodes. She remembers discussing a change to lantus in the past, but never followed up. She is concerned about making too many changes at once, and would like to get her back pain addressed first.  3. Dysuria. Burning with urination. Has negative urine culture and GC/CH in past month. Denies hesitancy, but couldn't urination on arrival today. No hematuria, vaginal discharge, fever, chills, abdominal swelling, suprapubic pain, itching.   Review of Systems  Genitourinary: Positive for dysuria.   See HPI otherwise negative.  reports that she has never smoked. She does not have any smokeless tobacco history on file.     Objective:   Physical Exam  Vitals reviewed. Constitutional: She is oriented to person, place, and time. She appears well-developed.  Appears anxious, thinner than previous.  HENT:  Head: Normocephalic and atraumatic.  Mouth/Throat: Oropharynx is clear and moist.  Eyes: EOM are normal.  Cardiovascular: Normal rate, regular rhythm and normal heart sounds.   No murmur heard. Pulmonary/Chest: Effort normal and breath sounds normal. No respiratory distress. She has no wheezes. She has no rales.  Abdominal: Soft. Bowel sounds are normal. She exhibits no distension and no mass. There is no tenderness.  There is no rebound and no guarding.  Genitourinary: Vagina normal. No vaginal discharge found.  Urethra appears wnl.  Musculoskeletal: She exhibits no edema and no tenderness.  Neurological: She is alert and oriented to person, place, and time. Coordination normal.  5/5 hip flexion/ext, knee flex/ext, ankle flexion/extension.  Hyperesthesia in thighs and lower legs. Sensation intact in perineum and inner thighs, normal sphincter tone.  Negative SLT. 1+ ankle and patellar reflex.  Normal gait.  Skin: No rash noted. She is not diaphoretic.       Assessment & Plan:

## 2013-02-17 NOTE — Patient Instructions (Addendum)
Increase your evening insulin dose: take 40 units in morning, 20 units in the evening. Make appointment with Dr. Raymondo Band to discuss lantus in next 2-4 weeks. Keep using neurontin 100 mg up to three times daily for pain. Do not take more than 6 tylenol tablets daily. Make appointment for check up in one month.

## 2013-02-17 NOTE — Addendum Note (Signed)
Addended by: [place], Tavish Gettis on: 02/17/2013 08:01 PM   Modules accepted: Orders

## 2013-02-19 LAB — URINE CULTURE
Colony Count: NO GROWTH
Organism ID, Bacteria: NO GROWTH

## 2013-02-22 ENCOUNTER — Telehealth: Payer: Self-pay | Admitting: *Deleted

## 2013-02-22 NOTE — Telephone Encounter (Signed)
F/u call made to schedule visit with pharmacist to discuss DM medication. Pt stated she was busy now, but will call clinic back.

## 2013-02-23 ENCOUNTER — Telehealth: Payer: Self-pay | Admitting: Family Medicine

## 2013-02-23 ENCOUNTER — Other Ambulatory Visit: Payer: Self-pay | Admitting: Family Medicine

## 2013-02-23 DIAGNOSIS — M549 Dorsalgia, unspecified: Secondary | ICD-10-CM

## 2013-02-23 NOTE — Telephone Encounter (Signed)
I have called patient to make sure she has neurosurgical/back pain f/u scheduled with [facility]. Provided her the number to make appointment in case she doesn't remember it. I have not heard back from Dr. Franky Macho after leaving a message to discuss follow up planning after the physician did her biopsy. Advised patient to call for questions.

## 2013-02-23 NOTE — Telephone Encounter (Signed)
Discussed case with Dr. Franky Macho. Will order repeat lumbar MRI with ongoing weight loss and elevated sed rate. F/u results to determine if further biopsy would be indicated.

## 2013-02-24 ENCOUNTER — Telehealth: Payer: Self-pay | Admitting: *Deleted

## 2013-02-24 NOTE — Telephone Encounter (Signed)
I set up MRI for Monday 3/24 @ 10am, but called patient about this and she declined the MRI in all so I cancelled it. We were on the phone for close to 15 min discussing this. We went over labs and past office visit notes. She states that she is very uncertain that she wants this done due to multiple MRI done already. I explained to her the importance of getting this done when she stated that she would like to be put on more pain meds due to the fact that we cannot figure out what is wrong. She understands that we are worried about the weight loss and increasing pain, but still she declines getting this done. I informed her to give [place] a call if she changes her mind so I can set this up for her.

## 2013-02-24 NOTE — Telephone Encounter (Signed)
Tried to call pt. Line busy.

## 2013-02-24 NOTE — Telephone Encounter (Signed)
I spoke with patient. Voiced concerns regarding her continued weight loss (30 lb in 6 months), ESR 88, severe back pain ongoing for months with suspicious MRI findings. Her lumbar biopsy, PPD, CBC, HIV was negative. She is resistant to undergo more testing because we don't have a diagnosis, but I discussed with her these findings are still concerning for ongoing malignancy or infection. She agrees to think about getting MRI to re-assess the previous fluid collection.  She will call when she is ready to schedule. Counseled on analgesia, to limit tylenol to 3000 mg daily. Limit advil to 600 mg BID prn.  F/u in clinic in one month or call with questions.

## 2013-03-01 ENCOUNTER — Other Ambulatory Visit (HOSPITAL_COMMUNITY): Payer: Federal, State, Local not specified - PPO

## 2013-03-01 ENCOUNTER — Encounter: Payer: Self-pay | Admitting: *Deleted

## 2013-03-01 IMAGING — RF DG FLUORO GUIDE NDL PLC/BX
8 series · 8 of 8 positions shown · non-contrast
Comparison: MRI 11/26/2012.  CT abdomen and pelvis 07/31/2012.

CLINICAL DATA: Severe back pain.  Abnormal paravertebral mass on
the left.

FLUORO GUIDED NEEDLE PLACEMENT FOR LEFT PSOAS INFLAMMATORY MASS
ASPIRATION.

[Series 4: (hospital) · 1 of 1 slices shown (1 of 5)]
[im 1/1]
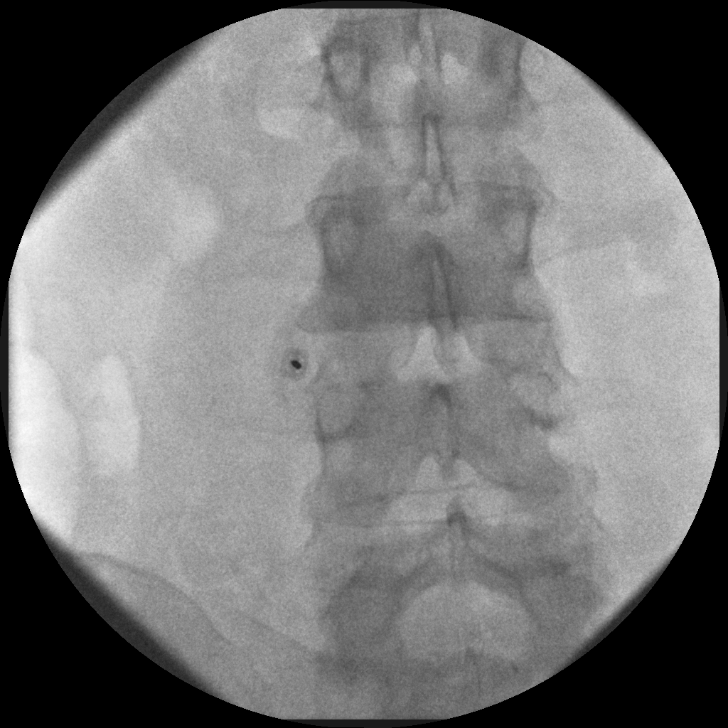

[Series 5: (hospital) · 1 of 1 slices shown (2 of 5)]
[im 1/1]
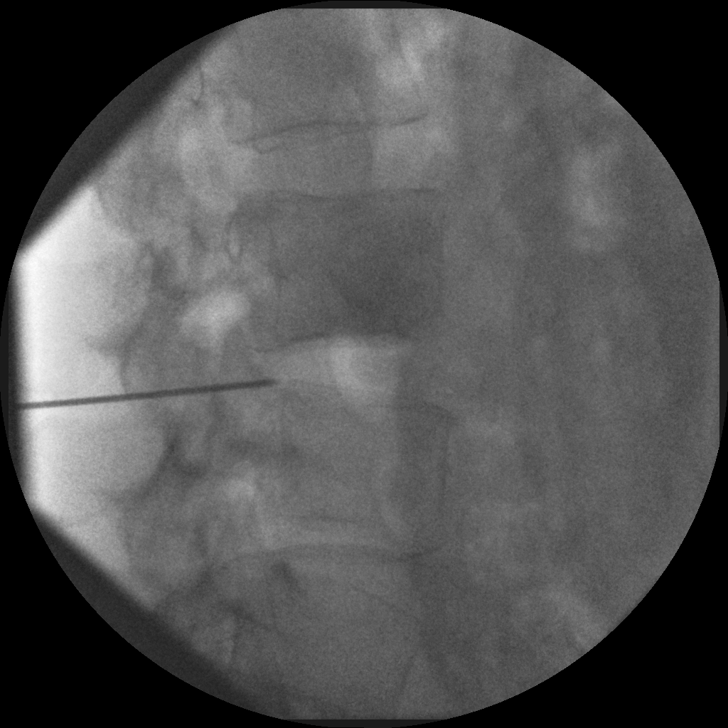

[Series 6: epi · 1 of 1 slices shown (1 of 3)]
[im 1/1]
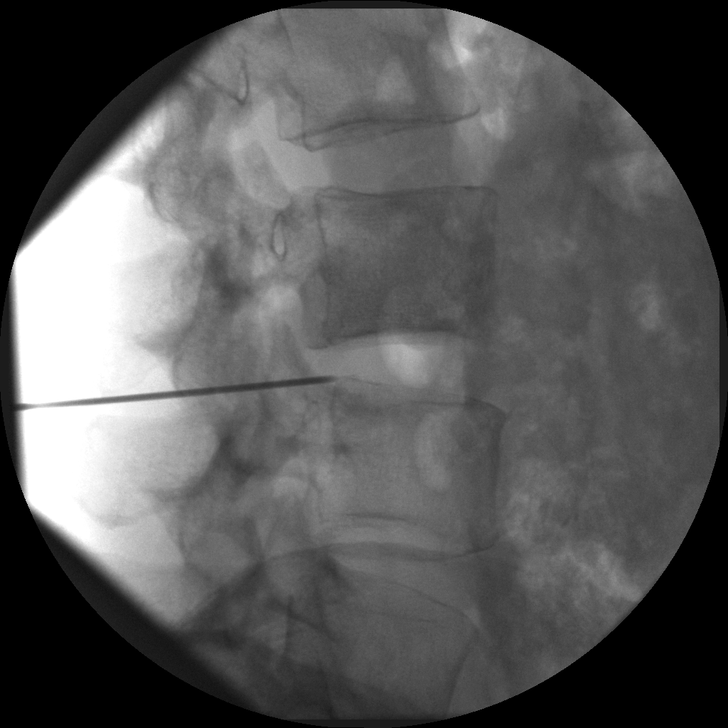

[Series 7: epi · 1 of 1 slices shown (2 of 3)]
[im 1/1]
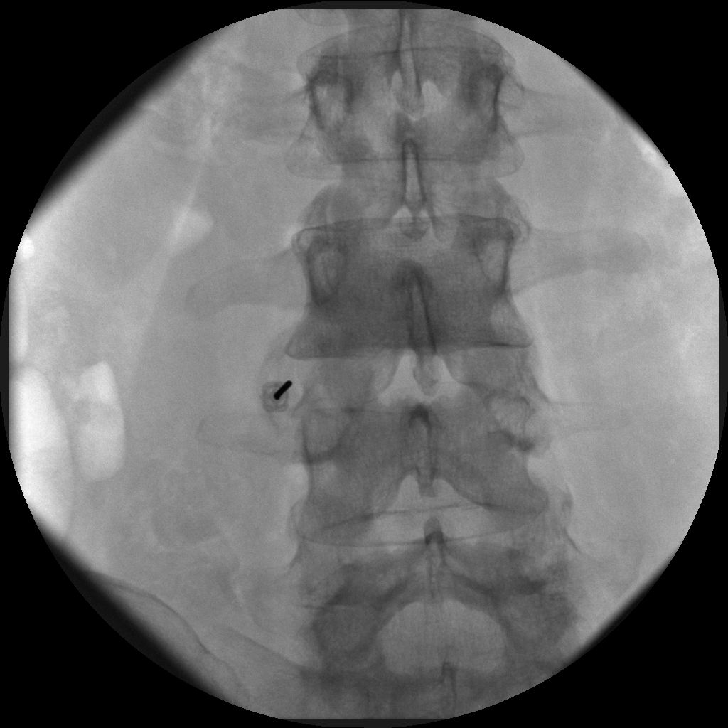

[Series 8: (hospital) · 1 of 1 slices shown (3 of 5)]
[im 1/1]
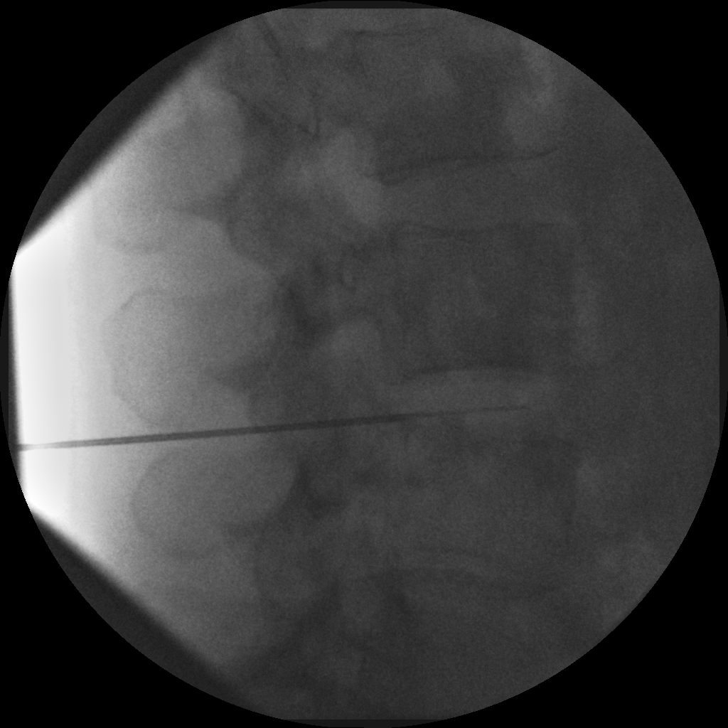

[Series 9: epi · 1 of 1 slices shown (3 of 3)]
[im 1/1]
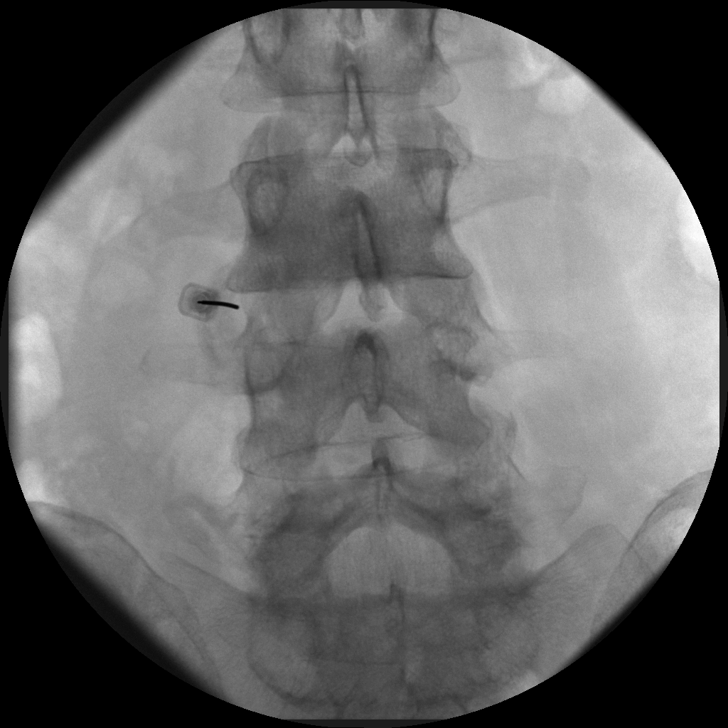

[Series 10: (hospital) · 1 of 1 slices shown (4 of 5)]
[im 1/1]
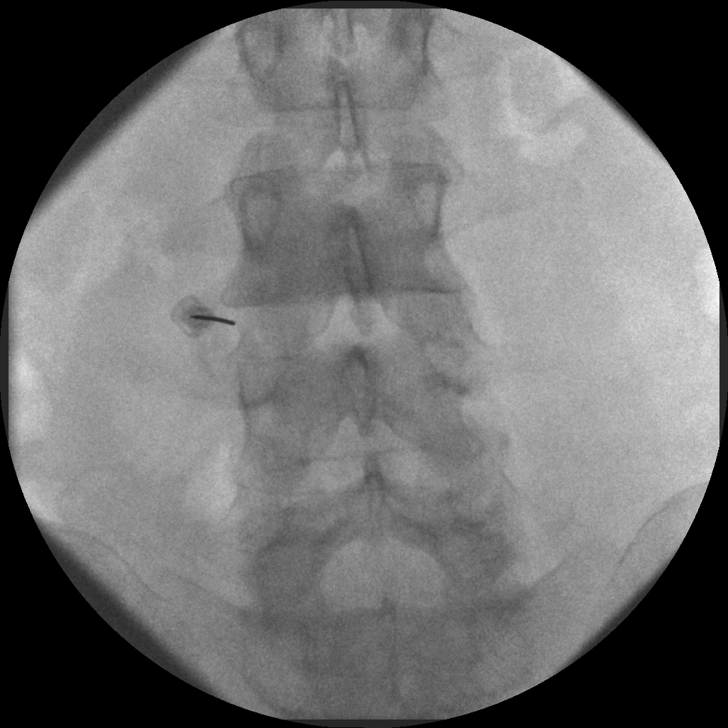

[Series 11: (hospital) · 1 of 1 slices shown (5 of 5)]
[im 1/1]
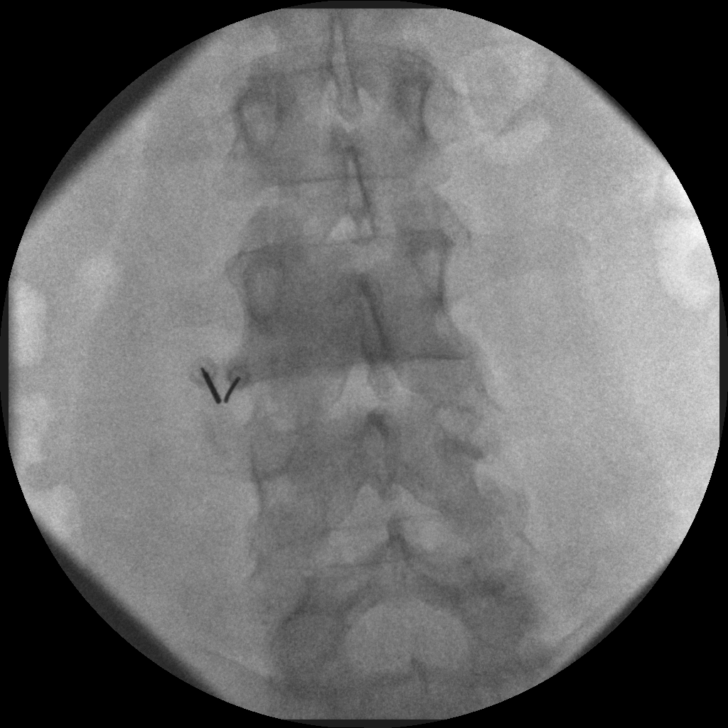

[8 of 8 positions shown; findings below may reference images not displayed]

FINDINGS: Following informed consent, sterile preparation of the
back, and local anesthesia, an 18 gauge needle was placed into the
left psoas muscle, through which a 22 gauge Mdsobujmd Ductacmv were
placed in a coaxial fashion for tissue sampling.  There was no
frank pus obtained.  The inflammatory mass was extremely tender
although clear-cut radicular symptoms were not elicited.  After
collection of two samples sent in this fashion, collected an
additional two samples with a 20-gauge Franceen needle.  Again, no
frank pus was encountered, but small tissue bits were collected
along with the blood tinged aspirate.  Post procedure the patient
was comfortable.  Specimens were sent for aerobic and anaerobic
culture, fungal, and TB analysis.
IMPRESSION: Technically successful left psoas inflammatory mass aspiration
without frank pus.  See discussion above.

Conscious sedation time: 30 minutes.

Fluoroscopic time: 1.21 minutes.

## 2013-03-24 ENCOUNTER — Ambulatory Visit: Payer: Federal, State, Local not specified - PPO | Admitting: Family Medicine

## 2013-03-29 ENCOUNTER — Encounter: Payer: Self-pay | Admitting: *Deleted

## 2013-04-05 ENCOUNTER — Other Ambulatory Visit: Payer: Self-pay | Admitting: Family Medicine

## 2013-04-07 ENCOUNTER — Ambulatory Visit: Payer: Federal, State, Local not specified - PPO | Admitting: Family Medicine

## 2013-04-16 ENCOUNTER — Ambulatory Visit (INDEPENDENT_AMBULATORY_CARE_PROVIDER_SITE_OTHER): Payer: Federal, State, Local not specified - PPO | Admitting: Family Medicine

## 2013-04-16 VITALS — BP 163/91 | HR 90 | Temp 98.2°F | Ht 67.0 in | Wt 123.0 lb

## 2013-04-16 DIAGNOSIS — R109 Unspecified abdominal pain: Secondary | ICD-10-CM

## 2013-04-16 DIAGNOSIS — M549 Dorsalgia, unspecified: Secondary | ICD-10-CM

## 2013-04-16 DIAGNOSIS — R634 Abnormal weight loss: Secondary | ICD-10-CM

## 2013-04-16 DIAGNOSIS — IMO0001 Reserved for inherently not codable concepts without codable children: Secondary | ICD-10-CM

## 2013-04-16 DIAGNOSIS — IMO0002 Reserved for concepts with insufficient information to code with codable children: Secondary | ICD-10-CM

## 2013-04-16 DIAGNOSIS — E1165 Type 2 diabetes mellitus with hyperglycemia: Secondary | ICD-10-CM

## 2013-04-16 LAB — CBC WITH DIFFERENTIAL/PLATELET
Basophils Absolute: 0 10*3/uL (ref 0.0–0.1)
Basophils Relative: 0 % (ref 0–1)
Eosinophils Absolute: 0 10*3/uL (ref 0.0–0.7)
Eosinophils Relative: 1 % (ref 0–5)
HCT: 33.1 % — ABNORMAL LOW (ref 36.0–46.0)
Hemoglobin: 10.6 g/dL — ABNORMAL LOW (ref 12.0–15.0)
Lymphocytes Relative: 45 % (ref 12–46)
Lymphs Abs: 2.4 10*3/uL (ref 0.7–4.0)
MCH: 25.6 pg — ABNORMAL LOW (ref 26.0–34.0)
MCHC: 32 g/dL (ref 30.0–36.0)
MCV: 80 fL (ref 78.0–100.0)
Monocytes Absolute: 0.6 10*3/uL (ref 0.1–1.0)
Monocytes Relative: 12 % (ref 3–12)
Neutro Abs: 2.2 10*3/uL (ref 1.7–7.7)
Neutrophils Relative %: 42 % — ABNORMAL LOW (ref 43–77)
Platelets: 329 10*3/uL (ref 150–400)
RBC: 4.14 MIL/uL (ref 3.87–5.11)
RDW: 14.8 % (ref 11.5–15.5)
WBC: 5.2 10*3/uL (ref 4.0–10.5)

## 2013-04-16 LAB — POCT URINALYSIS DIPSTICK
Bilirubin, UA: NEGATIVE
Blood, UA: NEGATIVE
Glucose, UA: 250
Ketones, UA: NEGATIVE
Nitrite, UA: NEGATIVE
Spec Grav, UA: 1.02
Urobilinogen, UA: 0.2
pH, UA: 7

## 2013-04-16 LAB — LIPID PANEL
Cholesterol: 168 mg/dL (ref 0–200)
HDL: 37 mg/dL — ABNORMAL LOW (ref >39)
LDL Cholesterol: 115 mg/dL — ABNORMAL HIGH (ref 0–99)
Total CHOL/HDL Ratio: 4.5 Ratio
Triglycerides: 81 mg/dL (ref <150)
VLDL: 16 mg/dL (ref 0–40)

## 2013-04-16 LAB — BASIC METABOLIC PANEL
BUN: 16 mg/dL (ref 6–23)
CO2: 26 mEq/L (ref 19–32)
Calcium: 9.5 mg/dL (ref 8.4–10.5)
Chloride: 99 mEq/L (ref 96–112)
Creat: 0.73 mg/dL (ref 0.50–1.10)
Glucose, Bld: 237 mg/dL — ABNORMAL HIGH (ref 70–99)
Potassium: 4.1 mEq/L (ref 3.5–5.3)
Sodium: 135 mEq/L (ref 135–145)

## 2013-04-16 MED ORDER — INSULIN NPH (HUMAN) (ISOPHANE) 100 UNIT/ML ~~LOC~~ SUSP: 25.0000 [IU] | Freq: Two times a day (BID) | SUBCUTANEOUS | Status: DC

## 2013-04-16 MED ORDER — CEPHALEXIN 500 MG PO CAPS: 500.0000 mg | ORAL_CAPSULE | Freq: Three times a day (TID) | ORAL | Status: DC

## 2013-04-16 NOTE — Assessment & Plan Note (Signed)
Improved significantly. Unsure of significance of the MRI findings. Patient still not interested in getting MRI scheduled at this time. If pain worsens again or weight loss persists, we discussed repeating workup and returning to Dr. Franky Macho to rule out malignancy or infection.

## 2013-04-16 NOTE — Patient Instructions (Addendum)
Increase your evening dose of insulin to 25 units.  Start keflex for possible UTI. I will call or send letter for labs. You may benefit from a cholesterol medication. Weight loss could be from poorly controlled diabetes. If your back pain gets worse or you continue to lose weight, we need to repeat MRI.  Make an appointment for check up in one month. Make an appointment with the health coach.

## 2013-04-16 NOTE — Addendum Note (Signed)
Addended by: Durwin Reges on: 04/16/2013 09:37 AM   Modules accepted: Orders

## 2013-04-16 NOTE — Assessment & Plan Note (Signed)
Uncontrolled hyperglycemia. Increase evening insulin by 5 units. She agrees to see Health coach. Check fasting labs today. F/u in one month.

## 2013-04-16 NOTE — Assessment & Plan Note (Signed)
25 lb weight loss since 02/2012. Uncontrolled DM2, back pain with suspicious MRI findings but negative needle biopsy. Elevated ESR. Since back pain is improved currently, patient is willing to get cancer screening colonoscopy and mammogram. Will have her f/u in one month for pap.

## 2013-04-16 NOTE — Progress Notes (Signed)
  Subjective:    Patient ID: Destiny Morton, female    DOB: August 15, 1956, 57 y.o.   MRN: [id number]  Urinary Tract Infection     1. Lower abdominal pain. Patient notes 2 weeks of suprapubic discomfort and slow urine stream, similar to a previous UTI episode. She denies incomplete emptying, abdominal bloating/swelling, dysuria, hematuria, fever, chills, flank/back pain, nausea/emesis, blood in stool. She does note some gas and constipation that resolves with eating fiber cereal. Has BM nearly daily.   2. Weight loss. Unintentional. She has lost about 25 lbs in past one year. However, this is stabilized in the past 3 months. Concern for back pain with atypical MRI findings and elevated ESR. Patient has refused further MRI and work up at this time, despite our discussions about possible malignancy or infection etiologies. She did have a negative needle aspiration biopsy of this fluid collection in Jan 2014 by Dr. Franky Macho. Her back pain is currently improved and she is being more active with walking. She does still have some tingling pains in her bilateral thighs, but is only taking 1 tylenol daily and does not need additional medications. Her A1c has been uncontrolled range 10.2-12.5 since 2008.  Denies night sweats, chills, fever currently.   3. DM2. Reports fasting CBGs 189-200 range. Post prandial recently 250-260. She denies numbness in her feet, unhealing wounds, polyuria, polyphagia.   Review of Systems See HPI otherwise negative.  reports that she has never smoked. She does not have any smokeless tobacco history on file.     Objective:   Physical Exam  Vitals reviewed. Constitutional: She is oriented to person, place, and time. She appears well-developed and well-nourished. No distress.  HENT:  Head: Normocephalic and atraumatic.  Mouth/Throat: Oropharynx is clear and moist. No oropharyngeal exudate.  Eyes: EOM are normal. Pupils are equal, round, and reactive to light.  Cardiovascular:  Normal rate, regular rhythm and normal heart sounds.   No murmur heard. Pulmonary/Chest: Effort normal.  Abdominal: Soft. She exhibits no mass. There is no tenderness. There is no guarding.  Hyperactive BS. Mild TTP suprapubic, no masses, guarding or rebound. Soft. Non-distended.    Musculoskeletal: She exhibits no edema and no tenderness.  Back symmetric, no spinous TTP.   Neurological: She is alert and oriented to person, place, and time.  Foot sensation intact to light touch.  Patellar, ankle, triceps, biceps reflexes globally 1+.  Skin: No rash noted. She is not diaphoretic.        Assessment & Plan:

## 2013-04-16 NOTE — Assessment & Plan Note (Addendum)
Suprapubic discomfort. UA indicates possible UTI, given her recent hx and DM2, will start empiric treatment keflex. No systemic signs. Urine culture. Could also represent neurogenic bladder given decreased stream, but physical exam does not indicate significant urinary retention. Would consider referral for urodynamics if she develops additional symptoms such as incomplete emptying or recurrent infections. F/u in one month.

## 2013-04-17 LAB — URINE CULTURE
Colony Count: NO GROWTH
Organism ID, Bacteria: NO GROWTH

## 2013-04-22 ENCOUNTER — Encounter: Payer: Self-pay | Admitting: Family Medicine

## 2013-04-27 ENCOUNTER — Other Ambulatory Visit: Payer: Self-pay | Admitting: *Deleted

## 2013-04-27 DIAGNOSIS — E1165 Type 2 diabetes mellitus with hyperglycemia: Secondary | ICD-10-CM

## 2013-04-27 DIAGNOSIS — IMO0002 Reserved for concepts with insufficient information to code with codable children: Secondary | ICD-10-CM

## 2013-04-27 MED ORDER — INSULIN NPH (HUMAN) (ISOPHANE) 100 UNIT/ML ~~LOC~~ SUSP: 25.0000 [IU] | Freq: Two times a day (BID) | SUBCUTANEOUS | Status: DC

## 2013-06-04 ENCOUNTER — Ambulatory Visit: Payer: Federal, State, Local not specified - PPO | Admitting: Family Medicine

## 2013-06-08 ENCOUNTER — Other Ambulatory Visit: Payer: Self-pay | Admitting: Family Medicine

## 2013-06-17 ENCOUNTER — Other Ambulatory Visit: Payer: Self-pay

## 2013-07-12 ENCOUNTER — Ambulatory Visit (INDEPENDENT_AMBULATORY_CARE_PROVIDER_SITE_OTHER): Payer: Federal, State, Local not specified - PPO | Admitting: Family Medicine

## 2013-07-12 ENCOUNTER — Encounter: Payer: Self-pay | Admitting: Family Medicine

## 2013-07-12 VITALS — BP 145/81 | HR 94 | Temp 98.0°F | Ht 67.0 in | Wt 126.0 lb

## 2013-07-12 DIAGNOSIS — R109 Unspecified abdominal pain: Secondary | ICD-10-CM

## 2013-07-12 DIAGNOSIS — K5900 Constipation, unspecified: Secondary | ICD-10-CM

## 2013-07-12 DIAGNOSIS — N390 Urinary tract infection, site not specified: Secondary | ICD-10-CM

## 2013-07-12 LAB — COMPREHENSIVE METABOLIC PANEL
ALT: 13 U/L (ref 0–35)
AST: 13 U/L (ref 0–37)
Albumin: 3.7 g/dL (ref 3.5–5.2)
Alkaline Phosphatase: 96 U/L (ref 39–117)
BUN: 11 mg/dL (ref 6–23)
CO2: 26 mEq/L (ref 19–32)
Calcium: 9.7 mg/dL (ref 8.4–10.5)
Chloride: 100 mEq/L (ref 96–112)
Creat: 0.8 mg/dL (ref 0.50–1.10)
Glucose, Bld: 199 mg/dL — ABNORMAL HIGH (ref 70–99)
Potassium: 4.4 mEq/L (ref 3.5–5.3)
Sodium: 135 mEq/L (ref 135–145)
Total Bilirubin: 0.6 mg/dL (ref 0.3–1.2)
Total Protein: 7.6 g/dL (ref 6.0–8.3)

## 2013-07-12 LAB — CBC WITH DIFFERENTIAL/PLATELET
Basophils Absolute: 0 10*3/uL (ref 0.0–0.1)
Basophils Relative: 0 % (ref 0–1)
Eosinophils Absolute: 0 10*3/uL (ref 0.0–0.7)
Eosinophils Relative: 0 % (ref 0–5)
HCT: 31.8 % — ABNORMAL LOW (ref 36.0–46.0)
Hemoglobin: 10.8 g/dL — ABNORMAL LOW (ref 12.0–15.0)
Lymphocytes Relative: 24 % (ref 12–46)
Lymphs Abs: 1.9 10*3/uL (ref 0.7–4.0)
MCH: 26.6 pg (ref 26.0–34.0)
MCHC: 34 g/dL (ref 30.0–36.0)
MCV: 78.3 fL (ref 78.0–100.0)
Monocytes Absolute: 0.8 10*3/uL (ref 0.1–1.0)
Monocytes Relative: 10 % (ref 3–12)
Neutro Abs: 5.3 10*3/uL (ref 1.7–7.7)
Neutrophils Relative %: 66 % (ref 43–77)
Platelets: 383 10*3/uL (ref 150–400)
RBC: 4.06 MIL/uL (ref 3.87–5.11)
RDW: 13.4 % (ref 11.5–15.5)
WBC: 8 10*3/uL (ref 4.0–10.5)

## 2013-07-12 LAB — TSH: TSH: 1.836 u[IU]/mL (ref 0.350–4.500)

## 2013-07-12 LAB — POCT UA - MICROSCOPIC ONLY

## 2013-07-12 LAB — POCT URINALYSIS DIPSTICK
Glucose, UA: NEGATIVE
Nitrite, UA: NEGATIVE
Protein, UA: 30
Spec Grav, UA: 1.025
Urobilinogen, UA: 0.2
pH, UA: 5.5

## 2013-07-12 MED ORDER — PHENAZOPYRIDINE HCL 100 MG PO TABS: 100.0000 mg | ORAL_TABLET | Freq: Three times a day (TID) | ORAL | Status: DC | PRN

## 2013-07-12 MED ORDER — POLYETHYLENE GLYCOL 3350 17 GM/SCOOP PO POWD: 17.0000 g | Freq: Two times a day (BID) | ORAL | Status: DC | PRN

## 2013-07-12 MED ORDER — CEPHALEXIN 500 MG PO CAPS: 500.0000 mg | ORAL_CAPSULE | Freq: Three times a day (TID) | ORAL | Status: DC

## 2013-07-12 NOTE — Progress Notes (Signed)
Subjective:     Patient ID: Destiny Morton, female   DOB: [birth year], 57 y.o.   MRN: [id number]  HPI  right sided/right flank/right abdominal pain she describes as achy most of the time but sharp and stabbing with certain movements. The pain has lasted approximately 1 week. In the past, she has had left sided pain that was extensively worked up with imaging and eventually a possible psoas mass was biopsied and cultured  back in January of this year. Due to the pain for the previous year she had lost 25 pounds because according to her she was in too much pain to eat. According to patient, and confirmed by her chart, the cultures were all negative and there was a very low suspicion for a malignancy. The pain on the left side has since resolved. She has regained her lost weight. She was worried about the pain on her right side considering her history. Her urinary stream is described as "slow" and it "starts and stops". She denies dysuria or hematuria. She also denies any headaches, fever, dizziness, chest pain, SOB, dark stools, hematochezia, or numbness burning or tingling or other pain into any of her extremities. She admits to troublesome constipation, for which she takes dulcolax with moderate relief. She has no other complaints.   Review of Systems As indicated in HPI. All other ROS negative.     Objective:   Physical Exam  Constitutional: She is oriented to person, place, and time.  Cardiovascular: Normal rate and regular rhythm.  Exam reveals no gallop and no friction rub.   No murmur heard. Pulmonary/Chest: Effort normal and breath sounds normal. She has no wheezes. She has no rales. She exhibits no tenderness.  Abdominal: Soft. Bowel sounds are normal. There is tenderness.  Tender in RLQ and lateral portion of right abdomen just above iliac crest and medial to right ASIS.  Voluntary guarding.   Neurological: She is alert and oriented to person, place, and time.  Skin: Skin is warm and dry.  No rash noted.  suprapubic tenderness.  MSK: No CVA tenderness BP 145/81  Pulse 94  Temp(Src) 98 F (36.7 C) (Oral)  Ht 5\' 7"  (1.702 m)  Wt 126 lb (57.153 kg)  BMI 19.73 kg/m2

## 2013-07-12 NOTE — Patient Instructions (Addendum)
Urinary Tract Infection Urinary tract infections (UTIs) can develop anywhere along your urinary tract. Your urinary tract is your body's drainage system for removing wastes and extra water. Your urinary tract includes two kidneys, two ureters, a bladder, and a urethra. Your kidneys are a pair of bean-shaped organs. Each kidney is about the size of your fist. They are located below your ribs, one on each side of your spine. CAUSES Infections are caused by microbes, which are microscopic organisms, including fungi, viruses, and bacteria. These organisms are so small that they can only be seen through a microscope. Bacteria are the microbes that most commonly cause UTIs. SYMPTOMS  Symptoms of UTIs may vary by age and gender of the patient and by the location of the infection. Symptoms in young women typically include a frequent and intense urge to urinate and a painful, burning feeling in the bladder or urethra during urination. Older women and men are more likely to be tired, shaky, and weak and have muscle aches and abdominal pain. A fever may mean the infection is in your kidneys. Other symptoms of a kidney infection include pain in your back or sides below the ribs, nausea, and vomiting. DIAGNOSIS To diagnose a UTI, your caregiver will ask you about your symptoms. Your caregiver also will ask to provide a urine sample. The urine sample will be tested for bacteria and white blood cells. White blood cells are made by your body to help fight infection. TREATMENT  Typically, UTIs can be treated with medication. Because most UTIs are caused by a bacterial infection, they usually can be treated with the use of antibiotics. The choice of antibiotic and length of treatment depend on your symptoms and the type of bacteria causing your infection. HOME CARE INSTRUCTIONS  If you were prescribed antibiotics, take them exactly as your caregiver instructs you. Finish the medication even if you feel better after you  have only taken some of the medication.  Drink enough water and fluids to keep your urine clear or pale yellow.  Avoid caffeine, tea, and carbonated beverages. They tend to irritate your bladder.  Empty your bladder often. Avoid holding urine for long periods of time.  Empty your bladder before and after sexual intercourse.  After a bowel movement, women should cleanse from front to back. Use each tissue only once. SEEK MEDICAL CARE IF:   You have back pain.  You develop a fever.  Your symptoms do not begin to resolve within 3 days. SEEK IMMEDIATE MEDICAL CARE IF:   You have severe back pain or lower abdominal pain.  You develop chills.  You have nausea or vomiting.  You have continued burning or discomfort with urination. MAKE SURE YOU:   Understand these instructions.  Will watch your condition.  Will get help right away if you are not doing well or get worse. Document Released: 09/04/2005 Document Revised: 05/26/2012 Document Reviewed: 01/03/2012 [facility] Patient Information 2014 [place], [place].  It was a pleasure meeting you today. I have called in an antibiotic for you. I have also ordered an xray that will be good for 10 days. If your symptoms do not improve with treatment of antibiotics and miralax then please have xray completed and let our office know you are still not feeling well.  If you develop fever, backpain, nausea or vomit or your pain becomes worse please be seen immediately.

## 2013-07-12 NOTE — Assessment & Plan Note (Addendum)
-   H/o of frequent UTI - Concern for pain on right side. ABd xray. UA ordered with 3+ bacteria. No nitrates.  - CBC, CMP and urine culture today; Prescribed Keflex and PYRIDIUM - F/U: in 1 week or sooner if no improvement with PCP

## 2013-07-12 NOTE — Assessment & Plan Note (Addendum)
-   patient reports she has had chronic constipation. - Miralax ordered today - Abd xray if pain worsens or no improvement with miralax - TSH today - F/u with PCP

## 2013-07-13 ENCOUNTER — Telehealth: Payer: Self-pay | Admitting: Family Medicine

## 2013-07-13 LAB — URINE CULTURE
Colony Count: NO GROWTH
Organism ID, Bacteria: NO GROWTH

## 2013-07-13 NOTE — Telephone Encounter (Signed)
Will fwd to pcp.  Paxton Kanaan L, CMA  

## 2013-07-13 NOTE — Telephone Encounter (Signed)
Please call Destiny Morton and ask her to make an appointment with  Her PCP or me 2-3 weeks. Her lab work indicated she is mildly anemic, and has been trending down for the past 11 months. Considering her abdominal pain and chronic problems, she needs to have a proper work up on the cause of her anemia. I am still waiting her urine culture (but I treated her), I can not see where she went to get the abd xray yesterday, please ask her to get it today if she is still experiencing pain. Thanks.

## 2013-07-13 NOTE — Telephone Encounter (Signed)
Patient is calling requesting an Rx for a stronger pain medication (Hydrocodone) because Tylenol isn't working well on the pain and she isn't able to rest well.

## 2013-07-13 NOTE — Telephone Encounter (Addendum)
Spoke with Mrs.Deshazer regarding request for pain med.  Per Dr. Claiborne Billings pat will need to have abd xray taken today before dispensing rx.   Also, will need to schedule a f/u appt this week for further eval.  Dr. Berline Chough will see patient if allowable on schedule. Pt agreed to have xray today.   Spoke with patient again and scheduled an appt for next Wednesday with Dr. Berline Chough.

## 2013-07-14 ENCOUNTER — Ambulatory Visit
Admission: RE | Admit: 2013-07-14 | Discharge: 2013-07-14 | Disposition: A | Discharge status: Home / Self Care | Payer: Federal, State, Local not specified - PPO | Source: Ambulatory Visit | Attending: Family Medicine | Admitting: Family Medicine

## 2013-07-14 DIAGNOSIS — K5900 Constipation, unspecified: Secondary | ICD-10-CM

## 2013-07-14 NOTE — Telephone Encounter (Signed)
Pt reports that she is having a small amount of pain but not like previously - will go get xray today - informed to stop anitbiotic and pyridium - has started to take Miralax and is having some pain relief. Encouraged pt to increase fluids especially after taking Miralax and to call as early as possible if she thinks that she needs continued eval. Later this week. Pt verbalized understanding. Wyatt Haste, RN-BSN

## 2013-07-14 NOTE — Telephone Encounter (Signed)
Please call patient and inform her she did NOT have a UTI. Her urine culture did not grow any bacteria. Thus, her pain is not from a UTI. She needs to get abdominal xray that we have asked her to get and be seen if she is still is pain. She can discontinue keflex and Pyridium as she did not have urinary infection. Thanks.

## 2013-07-16 ENCOUNTER — Telehealth: Payer: Self-pay | Admitting: Sports Medicine

## 2013-07-16 ENCOUNTER — Telehealth: Payer: Self-pay | Admitting: Family Medicine

## 2013-07-16 NOTE — Telephone Encounter (Signed)
Please call Destiny Morton at let her know her xray was normal, and did show some moderate amount of stool in her colon. Would recommend she continue the Miralax daily as needed and to call to be seen sooner if her symptoms are not improving, pain is worse or she develops a fever. Thanks

## 2013-07-16 NOTE — Telephone Encounter (Signed)
Pt is still wanting hydrocodone called in for her pain. I read what the doctor said and she said she still wants the hydrocodone for pain. JW

## 2013-07-16 NOTE — Telephone Encounter (Signed)
I'm unable to prescribe any pain medication without having previously seen the patient.   I will for this to Doctor Kuneff.  I will leave it to her discretion as to whether or not she feels that this is appropriate. Note f/u appointment next week

## 2013-07-16 NOTE — Telephone Encounter (Signed)
Left detailed message on pt's voicemail to return call if still not better. Destiny Morton, [name]

## 2013-07-16 NOTE — Telephone Encounter (Signed)
She needs to follow-up with PCP. Her studies have been normal thus far. If her pain is to the point of needing narcotics she needs to go to ED and be evaluated for cause.

## 2013-07-21 ENCOUNTER — Ambulatory Visit: Payer: Federal, State, Local not specified - PPO | Admitting: Sports Medicine

## 2013-08-17 ENCOUNTER — Other Ambulatory Visit: Payer: Self-pay | Admitting: Family Medicine

## 2013-08-23 ENCOUNTER — Telehealth: Payer: Self-pay | Admitting: Sports Medicine

## 2013-08-23 ENCOUNTER — Other Ambulatory Visit: Payer: Self-pay | Admitting: Sports Medicine

## 2013-08-23 MED ORDER — GLUCOSE BLOOD VI STRP: ORAL_STRIP | Status: DC

## 2013-08-23 NOTE — Telephone Encounter (Signed)
Patient states that she is suppose to received 3 boxes of test strips (90 day supply) , instead she received only 2. She called her mail-in pharmacy and they told her that the prescibing MD would need to re-send another script. Please resend corrected script.

## 2013-08-23 NOTE — Progress Notes (Signed)
Prescription for 300 strips sent to the pharmacy. Patient needs a followup appointment for any further refills of anything.  I have never met her and she is overdue for followup.

## 2014-01-10 ENCOUNTER — Ambulatory Visit (INDEPENDENT_AMBULATORY_CARE_PROVIDER_SITE_OTHER): Payer: Federal, State, Local not specified - PPO | Admitting: Family Medicine

## 2014-01-10 ENCOUNTER — Encounter: Payer: Self-pay | Admitting: Family Medicine

## 2014-01-10 VITALS — BP 149/84 | HR 79 | Temp 97.8°F | Resp 16 | Wt 140.0 lb

## 2014-01-10 DIAGNOSIS — IMO0001 Reserved for inherently not codable concepts without codable children: Secondary | ICD-10-CM

## 2014-01-10 DIAGNOSIS — IMO0002 Reserved for concepts with insufficient information to code with codable children: Secondary | ICD-10-CM

## 2014-01-10 DIAGNOSIS — R350 Frequency of micturition: Secondary | ICD-10-CM

## 2014-01-10 DIAGNOSIS — I10 Essential (primary) hypertension: Secondary | ICD-10-CM

## 2014-01-10 DIAGNOSIS — E1165 Type 2 diabetes mellitus with hyperglycemia: Secondary | ICD-10-CM

## 2014-01-10 LAB — POCT URINALYSIS DIPSTICK
Bilirubin, UA: NEGATIVE
Blood, UA: NEGATIVE
Glucose, UA: 500
Ketones, UA: NEGATIVE
Leukocytes, UA: NEGATIVE
Nitrite, UA: NEGATIVE
Protein, UA: NEGATIVE
Spec Grav, UA: >=1.03
Urobilinogen, UA: 0.2
pH, UA: 6

## 2014-01-10 LAB — POCT GLYCOSYLATED HEMOGLOBIN (HGB A1C): Hemoglobin A1C: 11.4

## 2014-01-10 MED ORDER — FLUCONAZOLE 150 MG PO TABS: 150.0000 mg | ORAL_TABLET | Freq: Once | ORAL | Status: DC

## 2014-01-10 MED ORDER — METFORMIN HCL 500 MG PO TABS: 500.0000 mg | ORAL_TABLET | Freq: Two times a day (BID) | ORAL | Status: DC

## 2014-01-10 NOTE — Assessment & Plan Note (Signed)
BP elevated today but pt did not take meds until arriving here at clinic. Will recheck at f/u visit in 2 weeks. I did explain to her that metoprolol should be taken twice daily instead of once daily.

## 2014-01-10 NOTE — Assessment & Plan Note (Signed)
A1c 11.4% today, suggesting very poor glycemic control. Will increase NPH to 45 units in AM and 25 units in PM. Will also add metformin 500mg  BID to increase sensitivity to insulin. F/u in 2 weeks for likely further titration of insulin. Will need to discuss statin at that visit given known hx of cardiovascular disease.

## 2014-01-10 NOTE — Progress Notes (Signed)
Patient ID: Destiny ShepherdJannie L Scheffel, female   DOB: 12/18/1955, 58 y.o.   MRN: [id number]  HPI:  Urinary problems: have been going on x 1 month. Feels pain when she doesn't empty bladder. Has been going frequently and has noticed that she still has to urinate even after she has recently urinated. Has also had itching for a while. This happened for 2 weeks but then she got OTC yeast infection cream which helped some. Has had some clear, thin vaginal discharge. Post-menopausal. Sexually active with one female partner x 40 years. No fevers but has had chills x 2 weeks. Has had some mild back pain and suprapubic pain. Stays constipated but takes miralax, which helps. Does not want pelvic exam today but is willing to self-swab for a wet prep.  HTN: Currently takes metoprolol 50mg  daily (not BID as prescribed) and amlodipine 10mg  daily. Denies chest pain, SOB, swelling in legs. Occasionally gets blurry vision but thinks this is when her sugars are "out of whack." She just took her morning BP medicine today here in clinic.  Diabetes: currently takes NPH 40 units in the morning and 20 units in the afternoon. Checks her sugar three times daily. Fasting readings in the morning are around 180. Never goes below 100 at all. In the afternoon gets in the 200's. Previously started metformin but didn't tolerate it, she thinks she didn't take it long enough to see if it would work out. Willing to try it again.  ROS: See HPI  PMFSH: hx HLD, DM, HTN, CVA  PHYSICAL EXAM: BP 149/84  Pulse 79  Temp(Src) 97.8 F (36.6 C) (Oral)  Resp 16  Wt 140 lb (63.504 kg)  SpO2 100% Gen: NAD, pleasant, cooperative HEENT: NCAT Heart: RRR, no murmurs Lungs: CTAB, NWOB Abdomen: soft, nontender to palpation in all quadrants. No masses or organomegaly appreciable. Pelvic: pt refused exam Neuro: grossly nonfocal, speech normal.  ASSESSMENT/PLAN:  # Urinary concerns: etiology unclear. UA not suggestive of UTI so will not rx abx at this  time, but will send for culture. Pt refused pelvic exam but did do a self-swab for a wet prep, which shows rare yeast. Will rx diflucan 150mg  x1 to see if she has any improvement with it. I think it is more likely that her urinary frequency is related to uncontrolled DM (A1c 11.4% today).   See problem based charting for additional assessment/plan.  FOLLOW UP: F/u in 2 weeks for HTN, DM, & HLD.  GrenadaBrittany J. Pollie MeyerMcIntyre, MD [facility]

## 2014-01-10 NOTE — Patient Instructions (Addendum)
It was nice to meet you today!  Please schedule a separate visit in the next 2 weeks to discuss your diabetes and cholesterol. Your diabetes number is very elevated today (11.4%) so this is important for us to discuss. I did send in a prescription for metformin. Let's increase your insulin to 45 units in the morning and 25 in the afternoon.  For your urinary concerns, I sent in a medicine to treat a yeast infection. It doesn't look like your urine is infected. Probably, some of the urinary problems are from your diabetes being uncontrolled. I will culture your urine and call you if it grows any bacteria.  For blood pressure, please take the lopressor (metoprolol) twice per day.  Be well, Dr. Pollie MeyerMcIntyre

## 2014-01-10 NOTE — Addendum Note (Signed)
Addended by: Latrelle DodrillMCINTYRE, BRITTANY J on: 01/10/2014 12:35 PM   Modules accepted: Orders

## 2014-01-12 LAB — URINE CULTURE
Colony Count: NO GROWTH
Organism ID, Bacteria: NO GROWTH

## 2014-01-13 ENCOUNTER — Telehealth: Payer: Self-pay | Admitting: Family Medicine

## 2014-01-13 MED ORDER — METFORMIN HCL 500 MG PO TABS
500.0000 mg | ORAL_TABLET | Freq: Two times a day (BID) | ORAL | Status: DC
Start: 2014-01-13 — End: 2015-05-26

## 2014-01-13 MED ORDER — FLUCONAZOLE 150 MG PO TABS: 150.0000 mg | ORAL_TABLET | Freq: Once | ORAL | Status: DC

## 2014-01-13 NOTE — Telephone Encounter (Signed)
Rx sent in. It appears that I sent it into the mail order pharmacy during her visit the other day. I resent it to [facility] [address]. Please call pt to inform. Thanks, Latrelle DodrillBrittany J McIntyre, MD

## 2014-01-13 NOTE — Telephone Encounter (Signed)
Pt notified.  Manmeet Arzola L, CMA  

## 2014-01-13 NOTE — Telephone Encounter (Signed)
Pt called and needs a her medications diflucan and metformin sent to [facility] [address]. jw

## 2014-01-14 ENCOUNTER — Telehealth: Payer: Self-pay | Admitting: Family Medicine

## 2014-01-14 NOTE — Telephone Encounter (Signed)
Pt called because she received her mail order prescriptions today that she canceled. We sent them to the mail order pharmacy by mistake and now she has to pay 60.00. They will not take the prescriptions back because she opened the outer package. She wanted to know was there anything that we can do to fix this situation. jw .

## 2014-01-17 NOTE — Telephone Encounter (Signed)
Called pt and she reports that the med received in error was Fluconazole 150 mg. Informed pt that I will let Dr. Pollie MeyerMcIntyre know. michell simpson, cma

## 2014-01-17 NOTE — Telephone Encounter (Signed)
I called [facility] to ask if they could waive the charge since the rx was sent to them in error, and was informed that her copay for the fluconazole was $9.47. There is no way for that copay to be waived according to the [facility] representative.  Called pt myself to apologize for the mistake and explained that I did try to fix this with [facility]. Recommended pt make sure we know which pharmacy she wants us to use with each prescription in the future. Pt appreciated the call.  Latrelle DodrillBrittany J Lanaiya Lantry, MD

## 2014-01-27 ENCOUNTER — Other Ambulatory Visit: Payer: Self-pay | Admitting: Family Medicine

## 2014-01-28 NOTE — Telephone Encounter (Signed)
Refilled x1 today, d/t Dr. Pollie MeyerMcIntyre on vacation.

## 2014-02-11 ENCOUNTER — Telehealth: Payer: Self-pay | Admitting: Family Medicine

## 2014-02-11 NOTE — Telephone Encounter (Signed)
Pt told that if she is still having urinary frequency then she needs to be evaluated again.  Pt verbalized understanding and will make an appt.   Destiny Morton, Darlyne RussianKristen L, CMA

## 2014-02-11 NOTE — Telephone Encounter (Signed)
Pt called because on her recent visit she was give Diflucan and she said that her symptoms have gotten worse and wanted to know if she needed something else. jw

## 2014-03-30 ENCOUNTER — Encounter: Payer: Self-pay | Admitting: Family Medicine

## 2014-03-30 ENCOUNTER — Ambulatory Visit (INDEPENDENT_AMBULATORY_CARE_PROVIDER_SITE_OTHER): Payer: Federal, State, Local not specified - PPO | Admitting: Family Medicine

## 2014-03-30 VITALS — BP 159/88 | HR 83 | Temp 97.9°F | Ht 67.0 in | Wt 146.6 lb

## 2014-03-30 DIAGNOSIS — R208 Other disturbances of skin sensation: Secondary | ICD-10-CM | POA: Insufficient documentation

## 2014-03-30 DIAGNOSIS — I10 Essential (primary) hypertension: Secondary | ICD-10-CM

## 2014-03-30 DIAGNOSIS — R209 Unspecified disturbances of skin sensation: Secondary | ICD-10-CM

## 2014-03-30 NOTE — Assessment & Plan Note (Signed)
Mildly elevated for age today. Possibly related to skin discomfort or white coat HTN. Has been >140 systolic for the past year. - F/u with PCP.

## 2014-03-30 NOTE — Progress Notes (Signed)
Patient ID: Jerel ShepherdJannie L Sieben, female   DOB: 10/22/1956, 58 y.o.   MRN: [id number] Subjective:   CC: Rash  HPI:   Patient comes in today for right sided abdominal skin "soreness" for 2 weeks. She has no visible skin changes or rash but has had an itching soreness that is "bothersome" for 2 weeks. She recently started metformin (3 weeks ago) but has had no other new medications or skin care / detergent products. Denies fevers or chills. Denies skin issue hx other than eczematous skin that improves with vaseline. She has tried vaseline and neosporin on the area and it has helped. She was previously injecting her insulin on that side of her abdomen for years, but switched to the left side, which has helped some. Denies any other symptoms.  Review of Systems - Per HPI. Additionally, she has had 4-5 years of night sweats she attributes to menopause.  PMH reviewed. Meds reviewed.  Smoking status: Never smoker  Objective:  Physical Exam BP 159/88  Pulse 83  Temp(Src) 97.9 F (36.6 C) (Oral)  Ht 5\' 7"  (1.702 m)  Wt 146 lb 9.6 oz (66.497 kg)  BMI 22.96 kg/m2 GEN: NAD SKIN: Normal-appearing skin of abdomen with no rash, erythema, swelling, or warmth. Skin is mildly itchy/tenderness on palpation.    Assessment:     Jerel ShepherdJannie L Himmelberger is a 58 y.o. female here for abdominal itching/skin discomfort.    Plan:     Follow-up: Follow up in 1-2 weeks for re-evaluation if no improvement in skin symptoms, or sooner if skin changes.  Skin discomfort - DDx includes nonspecific neuropathic skin changes from insulin injections to the site, shingles (no rash in 2 weeks makes less likely), infection (no induration or warmth or fever makes this unlikely), or cholestasis.  - Continue vaseline. - Can try OTC hydrocortisone cream for 1-2 weeks. - Has h/o neuropathic pain. Could try capsaicin cream. - F/u 1-2 weeks if no improvement or sooner prn changes/worsening for further workup/treatment. - Stop  neosporin.  BP - Mildly elevated for age today. Possibly related to skin discomfort or white coat HTN. Has been >140 systolic for the past year. - F/u with PCP.   Leona SingletonMaria T Joclyn Alsobrook, MD [facility]

## 2014-03-30 NOTE — Assessment & Plan Note (Signed)
DDx includes nonspecific neuropathic skin changes from insulin injections to the site, shingles (no rash in 2 weeks makes less likely), infection (no induration or warmth or fever makes this unlikely), or cholestasis.  - Continue vaseline. - Can try OTC hydrocortisone cream for 1-2 weeks. - Has h/o neuropathic pain. Could try capsaicin cream. - F/u 1-2 weeks if no improvement or sooner prn changes/worsening for further workup/treatment. - Stop neosporin.

## 2014-03-30 NOTE — Patient Instructions (Signed)
It was good to meet you.  I am sorry you are having this itchiness. Continue to use vaseline. You can also try 1% hydrocortisone cream (over the counter) daily for 1 week to see if this helps with the symptoms. Come back in 1-2 weeks if no improvement or sooner if this changes/worsens/a rash appears.  Best,  Leona SingletonMaria T Aliana Kreischer, MD

## 2014-04-04 ENCOUNTER — Other Ambulatory Visit: Payer: Self-pay | Admitting: Family Medicine

## 2014-04-04 NOTE — Telephone Encounter (Signed)
Covering box for Dr. Pollie MeyerMcIntyre,   Refilled metoprolol, she wanted to continue and re-check at last visit. Given R-0 as she will need f/u. Will ask staff to notify.   Murtis SinkSam Bradshaw, MD [facility] Resident, PGY-2 04/04/2014, 4:37 PM

## 2014-04-04 NOTE — Telephone Encounter (Signed)
Refilled amlodipine as well, msg sent to staff to request appt.   Murtis SinkSam Laykin Rainone, MD [facility] Resident, PGY-2 04/04/2014, 5:10 PM

## 2014-04-05 NOTE — Telephone Encounter (Signed)
Called pt.  LM for her to return our call.  Please ask pt to schedule OV with Dr. Ermalinda MemosBradshaw for F/u per his request.  Radene OuKristen L Harshini Trent, CMA

## 2014-05-04 ENCOUNTER — Other Ambulatory Visit: Payer: Self-pay | Admitting: Family Medicine

## 2014-05-06 ENCOUNTER — Telehealth: Payer: Self-pay | Admitting: Family Medicine

## 2014-05-06 ENCOUNTER — Other Ambulatory Visit: Payer: Self-pay | Admitting: *Deleted

## 2014-05-06 NOTE — Telephone Encounter (Signed)
To [facility] red team - please call pt and let her know I have refilled one month's worth of her blood pressure medicine but she needs to schedule an appointment to follow up on her BP. Thanks!  [name], MD

## 2014-05-09 NOTE — Telephone Encounter (Signed)
Called pt. LMVM with info. Destiny Morton

## 2014-05-11 ENCOUNTER — Other Ambulatory Visit: Payer: Self-pay | Admitting: Family Medicine

## 2014-05-29 ENCOUNTER — Other Ambulatory Visit: Payer: Self-pay | Admitting: Family Medicine

## 2014-06-08 ENCOUNTER — Ambulatory Visit (INDEPENDENT_AMBULATORY_CARE_PROVIDER_SITE_OTHER): Payer: Federal, State, Local not specified - PPO | Admitting: Family Medicine

## 2014-06-08 ENCOUNTER — Other Ambulatory Visit (HOSPITAL_COMMUNITY)
Admission: RE | Admit: 2014-06-08 | Discharge: 2014-06-08 | Disposition: A | Discharge status: Home / Self Care | Payer: Federal, State, Local not specified - PPO | Source: Ambulatory Visit | Attending: Family Medicine | Admitting: Family Medicine

## 2014-06-08 VITALS — BP 139/87 | HR 76 | Temp 98.2°F | Ht 67.0 in | Wt 147.0 lb

## 2014-06-08 DIAGNOSIS — Z124 Encounter for screening for malignant neoplasm of cervix: Secondary | ICD-10-CM

## 2014-06-08 DIAGNOSIS — Z01419 Encounter for gynecological examination (general) (routine) without abnormal findings: Secondary | ICD-10-CM | POA: Insufficient documentation

## 2014-06-08 DIAGNOSIS — N898 Other specified noninflammatory disorders of vagina: Secondary | ICD-10-CM

## 2014-06-08 DIAGNOSIS — N8184 Pelvic muscle wasting: Secondary | ICD-10-CM

## 2014-06-08 DIAGNOSIS — Z1151 Encounter for screening for human papillomavirus (HPV): Secondary | ICD-10-CM | POA: Insufficient documentation

## 2014-06-08 DIAGNOSIS — M6289 Other specified disorders of muscle: Secondary | ICD-10-CM

## 2014-06-08 DIAGNOSIS — N390 Urinary tract infection, site not specified: Secondary | ICD-10-CM

## 2014-06-08 LAB — POCT URINALYSIS DIPSTICK
Blood, UA: NEGATIVE
Glucose, UA: 100
Ketones, UA: NEGATIVE
Leukocytes, UA: NEGATIVE
Nitrite, UA: NEGATIVE
Protein, UA: 30
Spec Grav, UA: >=1.03
Urobilinogen, UA: 0.2
pH, UA: 5.5

## 2014-06-08 LAB — POCT WET PREP (WET MOUNT): Clue Cells Wet Prep Whiff POC: NEGATIVE

## 2014-06-08 LAB — POCT UA - MICROSCOPIC ONLY

## 2014-06-08 NOTE — Patient Instructions (Signed)
I am referring you to a urologist for the urinary symptoms you've been having. It doesn't look like you have an infection right now.  Follow up with me in a few weeks to discuss your diabetes.  Be well, Dr. Pollie MeyerMcIntyre

## 2014-06-08 NOTE — Assessment & Plan Note (Addendum)
Suspect she has a cystocele causing her urinary symptoms. Urine at present is not indicative of an infection, and was a very dirty catch with many squamous cells. Will check wet prep since she's having vaginal discharge. I will refer her to urology for further evaluation and possible pessary fitting.

## 2014-06-08 NOTE — Progress Notes (Signed)
Patient ID: Destiny Morton, female   DOB: 12/10/1955, 58 y.o.   MRN: [id number]  HPI:  Patient presents today to discuss urinary symptoms. She states that she for the last month has felt she is not emptying her bladder well. She denies any burning or dysuria. Has had no fevers or new back pain. She has a history of or prior vaginal deliveries. She is postmenopausal. No vaginal bleeding. She has had some vaginal discharge for a month. No pelvic pain. The discharge is malodorous. She has had no significant itching down in her genital area, just a little.  She also mentions that she has frequent sweating from her menopause, occasional swelling in her feet, and had a cough that is now resolved. Also has had some discomfort in her skin on her side, which has improved slightly after switching where she gets her insulin to another site on her skin. Advised that we would need to schedule a separate visit to discuss these concerns, and encouraged her to schedule a separate visit.  ROS: See HPI  PMFSH: History of poorly controlled diabetes, hyperlipidemia, hypertension.  PHYSICAL EXAM: BP 139/87  Pulse 76  Temp(Src) 98.2 F (36.8 C) (Oral)  Ht 5\' 7"  (1.702 m)  Wt 147 lb (66.679 kg)  BMI 23.02 kg/m2 Gen: NAD, pleasant, cooperative HEENT: NCAT Lungs: normal respiratory effort Neuro: grossly nonfocal, speech normal GU: normal appearing external genitalia without lesions, does have some old displaced labia minora beneath her vagina which pt states is from a laceration repair after one of her vaginal deliveries. Rugated vaginal tissue visible from the outside. Vagina is moist with white discharge. Cervix normal in appearance. No cervical motion tenderness or tenderness on bimanual exam. No adnexal masses. She does have some pelvic organ prolapse when bearing down.  ASSESSMENT/PLAN:  # Health maintenance:  -due for pap smear, we did this today since we were doing a pelvic exam anyways -advised pt to  schedule separate health maintenance visit to address the rest of her health maintenance items  See problem based charting for additional assessment/plan.  FOLLOW UP: F/u in a few weeks for diabetes Schedule separate visit for health maintenance physical Schedule separate visit to address other concerns identified above  GrenadaBrittany J. Pollie MeyerMcIntyre, MD [facility]

## 2014-06-13 LAB — CYTOLOGY - PAP

## 2014-06-16 ENCOUNTER — Encounter: Payer: Self-pay | Admitting: Family Medicine

## 2014-07-02 ENCOUNTER — Other Ambulatory Visit: Payer: Self-pay | Admitting: Family Medicine

## 2014-08-09 ENCOUNTER — Other Ambulatory Visit: Payer: Self-pay | Admitting: Family Medicine

## 2014-08-19 ENCOUNTER — Telehealth: Payer: Self-pay | Admitting: Family Medicine

## 2014-08-19 NOTE — Telephone Encounter (Signed)
Received letter stating that pt no showed to her appointment with [facility] on 07/19/2014 at 11:15am. [facility] red team, please call pt and encourage her to call their office and reschedule her appointment.  [name], MD

## 2014-08-22 NOTE — Telephone Encounter (Signed)
Left message on voicemail for patient to call and schedule missed urology appt

## 2014-09-05 ENCOUNTER — Other Ambulatory Visit: Payer: Self-pay | Admitting: Family Medicine

## 2014-09-12 ENCOUNTER — Other Ambulatory Visit: Payer: Self-pay | Admitting: Family Medicine

## 2014-11-02 ENCOUNTER — Other Ambulatory Visit: Payer: Self-pay | Admitting: Family Medicine

## 2014-11-05 ENCOUNTER — Other Ambulatory Visit: Payer: Self-pay | Admitting: Family Medicine

## 2014-11-08 ENCOUNTER — Telehealth: Payer: Self-pay | Admitting: Family Medicine

## 2014-11-08 ENCOUNTER — Other Ambulatory Visit: Payer: Self-pay | Admitting: *Deleted

## 2014-11-08 NOTE — Telephone Encounter (Signed)
To [facility] red team - please call pt and let her know I have refilled her medicine but she needs to schedule an appointment to follow up on her diabetes. Thanks!  Latrelle DodrillBrittany J Edilberto Roosevelt, MD

## 2014-11-08 NOTE — Telephone Encounter (Signed)
2nd request.  Nyleah Mcginnis L, RN  

## 2014-11-09 NOTE — Telephone Encounter (Signed)
Pt informed. Will call back and make a follow up appt. [place]ount, Deseree CMA

## 2015-02-03 ENCOUNTER — Telehealth: Payer: Self-pay | Admitting: Family Medicine

## 2015-02-03 ENCOUNTER — Other Ambulatory Visit: Payer: Self-pay | Admitting: Family Medicine

## 2015-02-03 NOTE — Telephone Encounter (Signed)
Pt is calling, she is completely out, wants to know if this can be done today?

## 2015-02-03 NOTE — Telephone Encounter (Signed)
[facility] by patient, Destiny Morton. She reports that she accidentally took 2 of her Amlodipine 10mg  pills today, and is concerned about potential side effects. She took her normal 10mg  dose in the AM at about 1000, when she got home tonight she accidentally took a second dose Amlodipine 10mg  around 7:30pm tonight. She denies any symptoms at this time. Denies any lightheadedness, dizziness, pre-syncope, visual disturbances, weakness, headache, chest pain, shortness of breath, lower extremity swelling. Chart review with HTN, her BP ranges SBP 120s to 150s, DBP 70s to 100s, recently some elevated BPs.  I provided reassurance, and advised her that the extra dose of Amlodipine today should not cause any major side effects. Unlikely to be significant cause of Hypotension with just 20mg  (taken 12 hours apart) in setting of chronically elevated BPs. Reviewed side-effect profile and does not seem to have any specific toxicities. Advised her that she can drink plenty of fluids and eat well tonight to avoid dehydration and reduced PO intake. If she develops any significant symptoms such as pre-syncope, persistent lightheadedness/dizziness, or other concerning symptoms she can call back to Emergency Line or if does have syncopal episode she should seek immediate attention in the ED.  Destiny PilarAlexander Karamalegos, DO [facility], PGY-2

## 2015-02-03 NOTE — Telephone Encounter (Signed)
Please call patient and let her know that I have refilled her insulin, but she must schedule a follow-up appointment for her diabetes in order to continue receiving prescriptions from me. She has not seen me for a diabetes appointment in over one year now. It is not safe for me to continue prescribing insulin for her without seeing her in the office.  Latrelle DodrillBrittany J Takesha Steger, MD

## 2015-02-06 NOTE — Telephone Encounter (Signed)
Tried calling, no answer, no voicemail. Will try again later. 

## 2015-02-07 NOTE — Telephone Encounter (Signed)
Called patient, no answer, no voicemail. Will send letter.

## 2015-02-27 ENCOUNTER — Other Ambulatory Visit: Payer: Self-pay | Admitting: Family Medicine

## 2015-03-01 ENCOUNTER — Ambulatory Visit: Payer: Federal, State, Local not specified - PPO | Admitting: Family Medicine

## 2015-03-19 ENCOUNTER — Other Ambulatory Visit: Payer: Self-pay | Admitting: Family Medicine

## 2015-03-20 NOTE — Telephone Encounter (Signed)
Covering for Dr Pollie MeyerMcIntyre. Per review of Dr Valorie RooseveltMcIntyre's last note it appears that the patient has had poor follow-up for her DM in the past year and has not been seen for DM in over a year. Dr Valorie RooseveltMcIntyre's last note indicates that the patient must have a follow-up appointment for her DM medications to continue being prescribed. Please inform the patient that she must be seen in the office for this medication to be prescribed per Dr Valorie RooseveltMcIntyre's most recent phone note. Thanks.

## 2015-03-21 NOTE — Telephone Encounter (Signed)
See message below. Please inform patient that per Dr Melba CoonMcIntyres last note she will need to be seen in the office for a refill on her insulin. Thanks.

## 2015-03-22 ENCOUNTER — Other Ambulatory Visit: Payer: Self-pay | Admitting: Family Medicine

## 2015-03-22 MED ORDER — "INSULIN SYRINGE-NEEDLE U-100 30G X 1/2"" 0.5 ML MISC": Status: DC

## 2015-03-22 MED ORDER — INSULIN NPH (HUMAN) (ISOPHANE) 100 UNIT/ML ~~LOC~~ SUSP: SUBCUTANEOUS | Status: DC

## 2015-03-22 NOTE — Telephone Encounter (Signed)
Covering for Dr Pollie MeyerMcIntyre. Discussed with her and I will send in enough to cover until Friday when she is supposed to have an appointment. Please call her and remind her that she needs to come to the appointment to get further refills. Thanks.

## 2015-03-22 NOTE — Telephone Encounter (Signed)
Sent in enough to get her to the appointment on Friday.

## 2015-03-22 NOTE — Telephone Encounter (Signed)
Pt called and needs a refill on her Humulin N and also her needles called in to her local pharmacy. jw

## 2015-03-24 ENCOUNTER — Encounter: Payer: Federal, State, Local not specified - PPO | Admitting: Family Medicine

## 2015-03-24 NOTE — Progress Notes (Signed)
Patient no showed for appointment.  Encounter opened in error.

## 2015-03-26 ENCOUNTER — Other Ambulatory Visit: Payer: Self-pay | Admitting: Family Medicine

## 2015-03-29 ENCOUNTER — Other Ambulatory Visit: Payer: Self-pay | Admitting: Family Medicine

## 2015-04-21 ENCOUNTER — Other Ambulatory Visit: Payer: Self-pay | Admitting: Family Medicine

## 2015-05-12 ENCOUNTER — Other Ambulatory Visit: Payer: Self-pay | Admitting: Family Medicine

## 2015-05-12 MED ORDER — GLUCOSE BLOOD VI STRP: ORAL_STRIP | Status: DC

## 2015-05-12 NOTE — Telephone Encounter (Signed)
Requesting rx for accu-chek compact plus strips, pt would like this to be sent to [facility] instead of mail order, would like 90 day supply

## 2015-05-20 ENCOUNTER — Other Ambulatory Visit: Payer: Self-pay | Admitting: Family Medicine

## 2015-05-26 ENCOUNTER — Encounter: Payer: Self-pay | Admitting: Family Medicine

## 2015-05-26 ENCOUNTER — Ambulatory Visit (INDEPENDENT_AMBULATORY_CARE_PROVIDER_SITE_OTHER): Payer: Federal, State, Local not specified - PPO | Admitting: Family Medicine

## 2015-05-26 VITALS — BP 165/93 | HR 78 | Temp 98.3°F | Ht 67.0 in | Wt 150.2 lb

## 2015-05-26 DIAGNOSIS — R232 Flushing: Secondary | ICD-10-CM

## 2015-05-26 DIAGNOSIS — N951 Menopausal and female climacteric states: Secondary | ICD-10-CM

## 2015-05-26 DIAGNOSIS — IMO0002 Reserved for concepts with insufficient information to code with codable children: Secondary | ICD-10-CM

## 2015-05-26 DIAGNOSIS — R8290 Unspecified abnormal findings in urine: Secondary | ICD-10-CM | POA: Diagnosis not present

## 2015-05-26 DIAGNOSIS — E1165 Type 2 diabetes mellitus with hyperglycemia: Secondary | ICD-10-CM

## 2015-05-26 DIAGNOSIS — E119 Type 2 diabetes mellitus without complications: Secondary | ICD-10-CM

## 2015-05-26 DIAGNOSIS — R5383 Other fatigue: Secondary | ICD-10-CM

## 2015-05-26 DIAGNOSIS — I10 Essential (primary) hypertension: Secondary | ICD-10-CM

## 2015-05-26 LAB — CBC WITH DIFFERENTIAL/PLATELET
Basophils Absolute: 0 10*3/uL (ref 0.0–0.1)
Basophils Relative: 0 % (ref 0–1)
Eosinophils Absolute: 0.1 10*3/uL (ref 0.0–0.7)
Eosinophils Relative: 1 % (ref 0–5)
HCT: 40.1 % (ref 36.0–46.0)
Hemoglobin: 12.8 g/dL (ref 12.0–15.0)
Lymphocytes Relative: 43 % (ref 12–46)
Lymphs Abs: 2.2 10*3/uL (ref 0.7–4.0)
MCH: 26.7 pg (ref 26.0–34.0)
MCHC: 31.9 g/dL (ref 30.0–36.0)
MCV: 83.7 fL (ref 78.0–100.0)
MPV: 12.4 fL (ref 8.6–12.4)
Monocytes Absolute: 0.6 10*3/uL (ref 0.1–1.0)
Monocytes Relative: 11 % (ref 3–12)
Neutro Abs: 2.3 10*3/uL (ref 1.7–7.7)
Neutrophils Relative %: 45 % (ref 43–77)
Platelets: 280 10*3/uL (ref 150–400)
RBC: 4.79 MIL/uL (ref 3.87–5.11)
RDW: 13.6 % (ref 11.5–15.5)
WBC: 5.1 10*3/uL (ref 4.0–10.5)

## 2015-05-26 LAB — POCT URINALYSIS DIPSTICK
Bilirubin, UA: NEGATIVE
Blood, UA: NEGATIVE
Glucose, UA: 500
Ketones, UA: NEGATIVE
Leukocytes, UA: NEGATIVE
Nitrite, UA: NEGATIVE
Protein, UA: NEGATIVE
Spec Grav, UA: 1.015
Urobilinogen, UA: 0.2
pH, UA: 6

## 2015-05-26 LAB — LIPID PANEL
Cholesterol: 182 mg/dL (ref 0–200)
HDL: 40 mg/dL — ABNORMAL LOW (ref >=46)
LDL Cholesterol: 122 mg/dL — ABNORMAL HIGH (ref 0–99)
Total CHOL/HDL Ratio: 4.6 Ratio
Triglycerides: 102 mg/dL (ref <150)
VLDL: 20 mg/dL (ref 0–40)

## 2015-05-26 LAB — COMPREHENSIVE METABOLIC PANEL
ALT: 18 U/L (ref 0–35)
AST: 14 U/L (ref 0–37)
Albumin: 3.8 g/dL (ref 3.5–5.2)
Alkaline Phosphatase: 130 U/L — ABNORMAL HIGH (ref 39–117)
BUN: 15 mg/dL (ref 6–23)
CO2: 26 mEq/L (ref 19–32)
Calcium: 9.2 mg/dL (ref 8.4–10.5)
Chloride: 100 mEq/L (ref 96–112)
Creat: 0.77 mg/dL (ref 0.50–1.10)
Glucose, Bld: 348 mg/dL — ABNORMAL HIGH (ref 70–99)
Potassium: 4.4 mEq/L (ref 3.5–5.3)
Sodium: 136 mEq/L (ref 135–145)
Total Bilirubin: 0.4 mg/dL (ref 0.2–1.2)
Total Protein: 7 g/dL (ref 6.0–8.3)

## 2015-05-26 LAB — TSH: TSH: 1.668 u[IU]/mL (ref 0.350–4.500)

## 2015-05-26 LAB — POCT GLYCOSYLATED HEMOGLOBIN (HGB A1C): Hemoglobin A1C: 13.3

## 2015-05-26 NOTE — Patient Instructions (Signed)
Increase insulin to 45 units in the morning and 30 units at night Referring to diabetic educator and eye doctor  Look at your feet closely every day  See the handout on how to schedule your mammogram. This is an important test to screen for breast cancer.  Follow up with me in 4 weeks for your diabetes. This is very important in order to get it under control.  Be well, Dr. Pollie Meyer

## 2015-05-26 NOTE — Progress Notes (Signed)
Patient ID: YARELYN HAITH, female   DOB: 1956-03-04, 59 y.o.   MRN: [id number]  HPI:  DM: currently taking humulin N 40 units in AM and 25 units in evening with dinner. Sugars run around 200+ in the morning, 170 at night before bed. A1c today 13.3. Pt reports inability to regulate how much she eats, just eats all the time. Interested in diabetic nutritionist. Denies CP, SOB, swelling.   Urinalysis: requests UA due to hx of UTI's. Denies having any urinary symptoms presently other than slightly malodorous urine.   Fatigue: has low energy and frequent hot flashes. Has had hot flashes for years since menopause began.  ROS: See HPI.  PMFSH: T2DM, HLD, HTN, postmenopausal  PHYSICAL EXAM: BP 165/93 mmHg  Pulse 78  Temp(Src) 98.3 F (36.8 C) (Oral)  Ht 5\' 7"  (1.702 m)  Wt 150 lb 3.2 oz (68.13 kg)  BMI 23.52 kg/m2 Gen: NAD, pleasant, cooperative HEENT: NCAT, MMM. No thyroid nodules or thyromegaly. Heart: RRR no murmur Lungs: CTAB NWOB Neuro: grossly nonfocal speech normal Ext: No appreciable lower extremity edema bilaterally  Diabetic foot exam: 2+ DP pulses bilat. Mild uninfected appearing crack on one toe, no surrounding erythema or drainage, nontender to palpation. Decreased sensation with monofilament testing bilaterally.  ASSESSMENT/PLAN:  Health maintenance:  -gave handout on scheduling mammogram  Fatigue Check basic labwork with labs today: TSH, vit D, CBC with diff  DM (diabetes mellitus), type 2, uncontrolled A1c markedly elevated today at 13.3. Discussed importance of close follow up to improve sugars. -refer to diabetic nutritionist -increase humulin N to 45 units in AM and 30 units in PM -f/u/ in 1 mo for further titration  Cardiac: check lipids today, will likely need statin Renal: check urine microalbumin today, CMET Eye: refer to ophtho for diabetic eye exam Foot: decreased sensation on monofilament testing today. Counseled pt on importance of closely inspecting  feet each day, and warning signs to watch for (cuts, infections, etc). Immunizations: offered pneumovax to pt today but she declined  HYPERTENSION, BENIGN SYSTEMIC BP noted to be elevated today but did not have time to fully address since we evaluated so many other issues. Pt will f/u in 1 mo at which time we will recheck BP and adjust meds if necessary.  Request for urinalysis: pt strongly requests UA despite not having any sx's of acute UTI (other than malodorous urine). UA ordered.  FOLLOW UP: F/u in 1 mo for DM & HTN  [place] J. Pollie Meyer, MD [facility]

## 2015-05-27 DIAGNOSIS — R5383 Other fatigue: Secondary | ICD-10-CM | POA: Insufficient documentation

## 2015-05-27 LAB — MICROALBUMIN / CREATININE URINE RATIO
Creatinine, Urine: 68.4 mg/dL
Microalb Creat Ratio: 4.4 mg/g (ref 0.0–30.0)
Microalb, Ur: 0.3 mg/dL (ref <2.0)

## 2015-05-27 LAB — VITAMIN D 25 HYDROXY (VIT D DEFICIENCY, FRACTURES): Vit D, 25-Hydroxy: 24 ng/mL — ABNORMAL LOW (ref 30–100)

## 2015-05-27 NOTE — Assessment & Plan Note (Signed)
A1c markedly elevated today at 13.3. Discussed importance of close follow up to improve sugars. -refer to diabetic nutritionist -increase humulin N to 45 units in AM and 30 units in PM -f/u/ in 1 mo for further titration  Cardiac: check lipids today, will likely need statin Renal: check urine microalbumin today, CMET Eye: refer to ophtho for diabetic eye exam Foot: decreased sensation on monofilament testing today. Counseled pt on importance of closely inspecting feet each day, and warning signs to watch for (cuts, infections, etc). Immunizations: offered pneumovax to pt today but she declined

## 2015-05-27 NOTE — Assessment & Plan Note (Signed)
BP noted to be elevated today but did not have time to fully address since we evaluated so many other issues. Pt will f/u in 1 mo at which time we will recheck BP and adjust meds if necessary.

## 2015-05-27 NOTE — Assessment & Plan Note (Addendum)
Check basic labwork with labs today: TSH, vit D, CBC with diff

## 2015-05-31 ENCOUNTER — Other Ambulatory Visit: Payer: Self-pay | Admitting: Family Medicine

## 2015-06-02 ENCOUNTER — Telehealth: Payer: Self-pay | Admitting: Family Medicine

## 2015-06-02 ENCOUNTER — Other Ambulatory Visit: Payer: Self-pay | Admitting: Family Medicine

## 2015-06-02 MED ORDER — CHOLECALCIFEROL 1.25 MG (50000 UT) PO TABS: 1.0000 | ORAL_TABLET | ORAL | Status: DC

## 2015-06-02 MED ORDER — SIMVASTATIN 20 MG PO TABS: 20.0000 mg | ORAL_TABLET | Freq: Every day | ORAL | Status: DC

## 2015-06-02 NOTE — Telephone Encounter (Signed)
Called pt to discuss labs.  -needs high intensity statin but patient refuses lipitor or crestor, states she is only willing to take simvastatin. Will rx simvastatin 20mg  daily (cannot go higher due to concurrently on amlodipine 10mg  daily). -vit D level low, will replete weekly x 8 weeks then recheck level -other labs good  Reminded pt to call and schedule follow up visit. Pt appreciative of call. [name], MD

## 2015-06-02 NOTE — Telephone Encounter (Signed)
Pt calling, she needs Humulin and Norvasc

## 2015-06-14 ENCOUNTER — Telehealth: Payer: Self-pay | Admitting: Family Medicine

## 2015-06-14 NOTE — Telephone Encounter (Signed)
Patient is calling because she was supposed to receive a letter containing her results. She states that it has been a couple of weeks since she was informed of this letter being sent. I verified the address on file and it is correct, however, she has not received this letter. She is requesting for it to be re-sent. Thank you, Destiny Morton, ASA

## 2015-06-14 NOTE — Telephone Encounter (Signed)
Lab results mailed to patient.

## 2015-06-20 ENCOUNTER — Other Ambulatory Visit: Payer: Self-pay | Admitting: Family Medicine

## 2015-06-23 ENCOUNTER — Other Ambulatory Visit: Payer: Self-pay | Admitting: *Deleted

## 2015-06-23 MED ORDER — METOPROLOL TARTRATE 50 MG PO TABS: ORAL_TABLET | ORAL | Status: DC

## 2015-07-25 ENCOUNTER — Other Ambulatory Visit: Payer: Self-pay | Admitting: Family Medicine

## 2015-07-25 ENCOUNTER — Telehealth: Payer: Self-pay | Admitting: Family Medicine

## 2015-07-25 NOTE — Telephone Encounter (Signed)
Pt needs to come in for office visit to discuss diabetes, at which time we can recheck her vitamin D level to see if she still needs to take it. Please inform patient.  [name], MD

## 2015-07-25 NOTE — Telephone Encounter (Signed)
Pt wants to know if she is suppose to continue to take vitamin D 2 Please advise

## 2015-07-26 NOTE — Telephone Encounter (Signed)
LMOVM for pt to call us back. Read message below.Deseree Bruna Potter, CMA

## 2015-07-27 NOTE — Telephone Encounter (Signed)
Patient has follow up scheduled for 8/30.

## 2015-08-08 ENCOUNTER — Encounter: Payer: Self-pay | Admitting: Family Medicine

## 2015-08-08 ENCOUNTER — Ambulatory Visit (INDEPENDENT_AMBULATORY_CARE_PROVIDER_SITE_OTHER): Payer: Federal, State, Local not specified - PPO | Admitting: Family Medicine

## 2015-08-08 VITALS — BP 152/88 | HR 72 | Temp 98.2°F | Ht 67.0 in | Wt 151.0 lb

## 2015-08-08 DIAGNOSIS — E559 Vitamin D deficiency, unspecified: Secondary | ICD-10-CM | POA: Insufficient documentation

## 2015-08-08 DIAGNOSIS — J309 Allergic rhinitis, unspecified: Secondary | ICD-10-CM | POA: Insufficient documentation

## 2015-08-08 DIAGNOSIS — E1165 Type 2 diabetes mellitus with hyperglycemia: Secondary | ICD-10-CM | POA: Diagnosis not present

## 2015-08-08 DIAGNOSIS — IMO0002 Reserved for concepts with insufficient information to code with codable children: Secondary | ICD-10-CM

## 2015-08-08 MED ORDER — INSULIN NPH (HUMAN) (ISOPHANE) 100 UNIT/ML ~~LOC~~ SUSP: SUBCUTANEOUS | Status: DC

## 2015-08-08 MED ORDER — CETIRIZINE HCL 10 MG PO TABS: 10.0000 mg | ORAL_TABLET | Freq: Every day | ORAL | Status: DC

## 2015-08-08 NOTE — Patient Instructions (Addendum)
It was great to see you again today!  Increase insulin to 50 units in the morning and 35 units at night. Continue checking sugars regularly We will discuss your blood pressure when you follow up in 1 month Rechecking vitamin D level today For allergies try zyrtec  daily. I sent in a prescription for you.  Be well, Dr. Pollie Meyer

## 2015-08-08 NOTE — Assessment & Plan Note (Signed)
Recheck vitamin D level today to guide need for further repletion.

## 2015-08-08 NOTE — Assessment & Plan Note (Signed)
No signs of bacterial infection on exam. Suspect allergic rhinitis. Trial of Zyrtec 10 mg daily.

## 2015-08-08 NOTE — Assessment & Plan Note (Signed)
Sugars poorly controlled, likely related to dietary indiscretion. Unable to go to diabetic nutritionist due to financial issues. Increase NPH to 50 units in the morning and 35 units at night. Follow-up in one month to see how sugars are doing.

## 2015-08-08 NOTE — Progress Notes (Signed)
Note: Patient arrived 20 minutes late to this visit. Advised we would only be able to discuss one issue due to time constraints. Even still, we addressed the following issues:  HPI:  Diabetes follow-up: Hasn't been able to schedule visit with ophthalmologist or nutritionist due to financial concerns. Sugars are poorly controlled. Getting 180s to 200s in the morning. Later in the day is also getting in the 200s, sometimes as high as 300s. Lowest she gets is 150s. Currently taking NPH 45 units in the morning and 30 at night.   Congestion: For the last 2 weeks has had itchy eyes, nasal congestion, and cough. Has taken Coricidin HBP medicine. No fevers. No sinus pain.  Vitamin D deficiency: Finished weekly repletion of vitamin D for 8 weeks, about 2 weeks ago. Wants to know if she should continue taking this medication. She has noticed she feels better after taking it.  ROS: See HPI.  PMFSH: History of hyperlipidemia, diabetes, hypertension, vitamin D deficiency  PHYSICAL EXAM: BP 152/87 mmHg  Pulse 72  Temp(Src) 98.2 F (36.8 C) (Oral)  Ht  (1.702 m)  Wt 151 lb (68.493 kg)  BMI 23.64 kg/m2 Gen: No acute distress, pleasant, cooperative HEENT: Normocephalic, atraumatic, oropharynx clear and moist, TMs clear bilaterally, no anterior cervical lymphadenopathy, no sinus tenderness bilaterally, nares patent Heart: Regular rate and rhythm, no murmur Lungs: Clear to auscultation bilaterally, normal respiratory effort Neuro: Grossly nonfocal, speech normal, gait normal  ASSESSMENT/PLAN:  DM (diabetes mellitus), type 2, uncontrolled Sugars poorly controlled, likely related to dietary indiscretion. Unable to go to diabetic nutritionist due to financial issues. Increase NPH to 50 units in the morning and 35 units at night. Follow-up in one month to see how sugars are doing.  Allergic rhinitis No signs of bacterial infection on exam. Suspect allergic rhinitis. Trial of Zyrtec 10 mg  daily.  Vitamin D deficiency Recheck vitamin D level today to guide need for further repletion.   FOLLOW UP: F/u in one month for diabetes  [place] J. Pollie Meyer, MD [facility]

## 2015-08-09 ENCOUNTER — Encounter: Payer: Self-pay | Admitting: Family Medicine

## 2015-08-09 LAB — VITAMIN D 25 HYDROXY (VIT D DEFICIENCY, FRACTURES): Vit D, 25-Hydroxy: 41 ng/mL (ref 30–100)

## 2015-08-15 ENCOUNTER — Other Ambulatory Visit: Payer: Self-pay | Admitting: Family Medicine

## 2015-08-15 NOTE — Telephone Encounter (Signed)
Pt called wanted to see if the doctor called in her Vitamin D-2, if not can the doctor call this in. jw

## 2015-08-16 MED ORDER — VITAMIN D3 10 MCG (400 UNIT) PO TABS
800.0000 [IU] | ORAL_TABLET | Freq: Every day | ORAL | Status: DC
Start: 2015-08-16 — End: 2019-11-25

## 2015-08-16 NOTE — Telephone Encounter (Signed)
Rx sent in for vitamin D3, two tablets daily.  Please inform patient. [name], MD

## 2015-08-17 NOTE — Telephone Encounter (Signed)
Tried calling patient, no answer, no voicemail

## 2015-08-20 ENCOUNTER — Other Ambulatory Visit: Payer: Self-pay | Admitting: Family Medicine

## 2015-09-06 ENCOUNTER — Other Ambulatory Visit: Payer: Self-pay | Admitting: Family Medicine

## 2015-09-14 ENCOUNTER — Telehealth: Payer: Self-pay | Admitting: Family Medicine

## 2015-09-14 NOTE — Telephone Encounter (Signed)
Pt is calling and would like to speak to Dr. Pollie Meyer about her medications. She has some questions about the different vitamins she is taking . jw

## 2015-09-14 NOTE — Telephone Encounter (Signed)
Spoke with patient, she just wanted to know the difference between the Vit D2 and Vit D3, Spoke with preceptor and explained to patient that one just has a higher concentration of Vitamin D. Patient expressed understanding, no further questions.

## 2015-10-31 ENCOUNTER — Other Ambulatory Visit: Payer: Self-pay | Admitting: Family Medicine

## 2015-12-12 ENCOUNTER — Other Ambulatory Visit: Payer: Self-pay | Admitting: Family Medicine

## 2015-12-20 ENCOUNTER — Other Ambulatory Visit: Payer: Self-pay | Admitting: Family Medicine

## 2015-12-21 NOTE — Telephone Encounter (Signed)
This is your patient, see med refill 

## 2015-12-25 ENCOUNTER — Other Ambulatory Visit: Payer: Self-pay | Admitting: Family Medicine

## 2016-01-27 ENCOUNTER — Other Ambulatory Visit: Payer: Self-pay | Admitting: Family Medicine

## 2016-01-30 ENCOUNTER — Telehealth: Payer: Self-pay | Admitting: Family Medicine

## 2016-01-30 NOTE — Telephone Encounter (Signed)
To [facility] red team - please call pt and let her know I have refilled her amlodipine but she needs to schedule an appointment to follow up on her medical problems. Thanks!  [name], MD

## 2016-01-31 ENCOUNTER — Other Ambulatory Visit: Payer: Self-pay | Admitting: Family Medicine

## 2016-01-31 NOTE — Telephone Encounter (Signed)
Has an appt 02/23/16. Esteen Delpriore, Maryjo Rochester, CMA

## 2016-02-13 ENCOUNTER — Other Ambulatory Visit: Payer: Self-pay | Admitting: Family Medicine

## 2016-02-19 ENCOUNTER — Ambulatory Visit: Payer: Federal, State, Local not specified - PPO | Admitting: Family Medicine

## 2016-02-23 ENCOUNTER — Ambulatory Visit: Payer: Federal, State, Local not specified - PPO | Admitting: Family Medicine

## 2016-02-27 ENCOUNTER — Other Ambulatory Visit: Payer: Self-pay | Admitting: Family Medicine

## 2016-03-01 ENCOUNTER — Ambulatory Visit (INDEPENDENT_AMBULATORY_CARE_PROVIDER_SITE_OTHER): Payer: Federal, State, Local not specified - PPO | Admitting: Family Medicine

## 2016-03-01 ENCOUNTER — Encounter: Payer: Self-pay | Admitting: Family Medicine

## 2016-03-01 VITALS — BP 174/82 | HR 75 | Temp 98.2°F | Wt 146.0 lb

## 2016-03-01 DIAGNOSIS — E785 Hyperlipidemia, unspecified: Secondary | ICD-10-CM

## 2016-03-01 DIAGNOSIS — E1165 Type 2 diabetes mellitus with hyperglycemia: Secondary | ICD-10-CM | POA: Diagnosis not present

## 2016-03-01 DIAGNOSIS — I10 Essential (primary) hypertension: Secondary | ICD-10-CM | POA: Diagnosis not present

## 2016-03-01 DIAGNOSIS — IMO0001 Reserved for inherently not codable concepts without codable children: Secondary | ICD-10-CM

## 2016-03-01 LAB — POCT GLYCOSYLATED HEMOGLOBIN (HGB A1C): Hemoglobin A1C: 13.3

## 2016-03-01 MED ORDER — INSULIN NPH (HUMAN) (ISOPHANE) 100 UNIT/ML ~~LOC~~ SUSP: SUBCUTANEOUS | Status: DC

## 2016-03-01 MED ORDER — SIMVASTATIN 20 MG PO TABS: 20.0000 mg | ORAL_TABLET | Freq: Every day | ORAL | Status: DC

## 2016-03-01 MED ORDER — METFORMIN HCL 500 MG PO TABS
500.0000 mg | ORAL_TABLET | Freq: Two times a day (BID) | ORAL | Status: DC
Start: 2016-03-01 — End: 2016-04-13

## 2016-03-01 MED ORDER — METOPROLOL TARTRATE 50 MG PO TABS: ORAL_TABLET | ORAL | Status: DC

## 2016-03-01 MED ORDER — AMLODIPINE BESYLATE 10 MG PO TABS: 10.0000 mg | ORAL_TABLET | Freq: Every day | ORAL | Status: DC

## 2016-03-01 NOTE — Patient Instructions (Signed)
Increase insulin to 50 units in morning and 35 at night Call me in 1-2 weeks so we can adjust your insulin  See the handout on how to schedule your colonoscopy. This is an important screening test for colon cancer.  See the handout on how to schedule your mammogram. This is an important test to screen for breast cancer.  Referring to eye doctor  Start metformin 500mg  daily  Schedule nurse visit within 2 weeks to check on your blood pressure.  Be well, Dr. Pollie MeyerMcIntyre

## 2016-03-01 NOTE — Progress Notes (Signed)
Date of Visit: 03/01/2016   HPI:  Patient presents for medication refill. She has had chronically poor follow up and I denied her medication refill recently as she had not been seen in many months.  Diabetes - currently taking NPh 45 units in the morning and 30 in the afternoon. Sugars typically run in the 200s and higher. No low sugars, lowest it gets is high 100s. Endorses dietary indiscretions. Denies chest pain or shortness of breath. Would like to try adding metformin. No recent eye exam. Needs referral.   Hyperlipidemia - not taking simvastatin.  Hypertension - did not take blood pressure medications yet this morning. Normally takes amlodipine and metoprolol.  ROS: See HPI.  PMFSH: history of diabetes, hyperlipidemia, hypertension, prior CVA, allergic rhinitis  PHYSICAL EXAM: BP 174/82 mmHg  Pulse 75  Temp(Src) 98.2 F (36.8 C) (Oral)  Wt 146 lb (66.225 kg) Gen: NAD, pleasant, cooperative HEENT: ncat Heart: regular rate and rhythm, no murmur Lungs: clear to auscultation bilaterally, normal work of breathing  Neuro: alert, grossly nonfocal, speech normal Diabetic Foot Check -  Appearance - no lesions, ulcers or calluses Skin - no unusual pallor or redness. Dry skin present Monofilament testing - decreased sensation bilateral feet  ASSESSMENT/PLAN:  Health maintenance:  -handouts given on scheduling mammogram and colonoscopy  DM (diabetes mellitus), type 2, uncontrolled Poorly controlled with A1c of 13.3. Has been chronically noncompliant with follow up. Stressed importance of regular follow up and that I cannot safely continue refilling medications if she does not come in for office visits.  Increase NPH to 50 units in AM, 35 units in PM. Follow up in 1-2 weeks by phone to continue titrating insulin Start metformin 500mg  daily (pt wants lowest dose possible)  Cardiac: not on statin presently. On aspirin. Renal: intolerant of ARBs Eye: refer to ophtho for eye  exam Foot: done today, counseled to look at feet daily, keep moisturized  HYPERTENSION, BENIGN SYSTEMIC Uncontrolled but did not take blood pressure medication yet today Refilled medication Follow up in 2 weeks for RN blood pressure check  Hyperlipidemia Encouraged compliance with statin   FOLLOW UP: Follow up in 1-2 weeks by phone for titrating insulin Schedule nurse visit in 2 weeks for blood pressure check  GrenadaBrittany J. Pollie MeyerMcIntyre, MD [facility]

## 2016-03-03 NOTE — Assessment & Plan Note (Addendum)
Poorly controlled with A1c of 13.3. Has been chronically noncompliant with follow up. Stressed importance of regular follow up and that I cannot safely continue refilling medications if she does not come in for office visits.  Increase NPH to 50 units in AM, 35 units in PM. Follow up in 1-2 weeks by phone to continue titrating insulin Start metformin 500mg  daily (pt wants lowest dose possible)  Cardiac: not on statin presently. On aspirin. Renal: intolerant of ARBs Eye: refer to ophtho for eye exam Foot: done today, counseled to look at feet daily, keep moisturized

## 2016-03-03 NOTE — Assessment & Plan Note (Signed)
Encouraged compliance with statin. 

## 2016-03-03 NOTE — Assessment & Plan Note (Signed)
Uncontrolled but did not take blood pressure medication yet today Refilled medication Follow up in 2 weeks for RN blood pressure check

## 2016-03-03 NOTE — Addendum Note (Signed)
Addended by: Latrelle DodrillMCINTYRE, BRITTANY J on: 03/03/2016 10:53 PM   Modules accepted: Orders

## 2016-03-29 ENCOUNTER — Ambulatory Visit (INDEPENDENT_AMBULATORY_CARE_PROVIDER_SITE_OTHER): Payer: Federal, State, Local not specified - PPO | Admitting: *Deleted

## 2016-03-29 VITALS — BP 140/80 | HR 88

## 2016-03-29 DIAGNOSIS — I10 Essential (primary) hypertension: Secondary | ICD-10-CM

## 2016-03-29 NOTE — Progress Notes (Signed)
Patient in today for BP check, blood pressure checked manually in left arm was 148/86. PAtient rested then blood pressure was checked again in right arm and measured 140/80, pulse 88. Patient reports that she has been taking amlodipine and metoprolol as directed. Patient scheduled for another BP on check on 4/28.

## 2016-04-13 ENCOUNTER — Other Ambulatory Visit: Payer: Self-pay | Admitting: Family Medicine

## 2016-04-15 NOTE — Telephone Encounter (Signed)
Gave enough metformin for twice a day for one month although patient may be taking only daily Per last note

## 2016-05-03 ENCOUNTER — Ambulatory Visit: Payer: Federal, State, Local not specified - PPO | Admitting: Family Medicine

## 2016-05-07 ENCOUNTER — Ambulatory Visit: Payer: Federal, State, Local not specified - PPO | Admitting: Family Medicine

## 2016-05-11 ENCOUNTER — Other Ambulatory Visit: Payer: Self-pay | Admitting: Family Medicine

## 2016-05-17 ENCOUNTER — Other Ambulatory Visit: Payer: Self-pay | Admitting: Family Medicine

## 2016-05-28 ENCOUNTER — Other Ambulatory Visit: Payer: Self-pay | Admitting: Family Medicine

## 2016-05-29 ENCOUNTER — Ambulatory Visit (INDEPENDENT_AMBULATORY_CARE_PROVIDER_SITE_OTHER): Payer: Federal, State, Local not specified - PPO | Admitting: Family Medicine

## 2016-05-29 ENCOUNTER — Encounter: Payer: Self-pay | Admitting: Family Medicine

## 2016-05-29 DIAGNOSIS — R350 Frequency of micturition: Secondary | ICD-10-CM | POA: Diagnosis not present

## 2016-05-29 LAB — POCT URINALYSIS DIPSTICK
Blood, UA: NEGATIVE
Glucose, UA: 250
Leukocytes, UA: NEGATIVE
Nitrite, UA: NEGATIVE
Protein, UA: 30
Spec Grav, UA: >=1.03
Urobilinogen, UA: 0.2
pH, UA: 5.5

## 2016-05-29 NOTE — Progress Notes (Signed)
    Subjective:  Destiny ShepherdJannie L Morton is a 60 y.o. female who [facility] with a chief complaint of urinary frequency.   HPI:  Urinary Frequency Patient reports increased urinary frequency for about the last month. Also reports that she sometimes suddenly stops while urinating. No dysuria. No fevers. No vaginal discharge or irritation. No medications tried. No urinary incontinence. Occasionally gets a strong urge to urinate, but always makes to to the bathroom in time. Occasionally feels like her bladder is not emptying completely. Wakes up a few times at night to urinate.   ROS: Per HPI  PMH: Smoking history reviewed.   Objective:  Physical Exam: BP 152/100 mmHg  Pulse 75  Temp(Src) 98.1 F (36.7 C) (Oral)  Wt 150 lb (68.04 kg)  Gen: NAD, resting comfortably CV: RRR with no murmurs appreciated Pulm: NWOB, CTAB with no crackles, wheezes, or rhonchi GI: Normal bowel sounds present. Soft, Nontender, Nondistended. MSK: no edema, cyanosis, or clubbing noted Skin: warm, dry Neuro: grossly normal, moves all extremities Psych: Normal affect and thought content  Results for orders placed or performed in visit on 05/29/16 (from the past 72 hour(s))  POCT urinalysis dipstick     Status: Abnormal   Collection Time: 05/29/16  9:20 AM  Result Value Ref Range   Color, UA YELLOW    Clarity, UA CLEAR    Glucose, UA 250    Bilirubin, UA SMALL    Ketones, UA TRACE    Spec Grav, UA >=1.030    Blood, UA NEG    pH, UA 5.5    Protein, UA 30    Urobilinogen, UA 0.2    Nitrite, UA NEG    Leukocytes, UA Negative Negative   Assessment/Plan:  Urinary Frequency UA without signs of infection, though will send for culture. Symptoms likely secondary to poorly controlled DM, though looking through the patient's chart she does have a history of pelvic floor dysfunction which could be contributing. Discussed blood sugar management with patient. She will be following up with her PCP soon to  discuss this. Also discussed behavioral modifications such as urinating at regular intervals, double voiding, and avoiding drinking large amounts of fluids before bed. If symptoms persist and work up negative, may need referral to urology.  Katina Degreealeb M. Jimmey RalphParker, MD [facility] Resident PGY-2 05/29/2016 9:41 AM

## 2016-05-29 NOTE — Patient Instructions (Signed)
We will check your urine for infection.   In the meantime, please try to urinate at regular intervals.   Getting your blood sugar under control will help a lot.  Please come back soon to see your regular doctor to discuss your blood sugar management.  Take care,  Dr Jimmey RalphParker

## 2016-05-30 ENCOUNTER — Other Ambulatory Visit: Payer: Self-pay | Admitting: Family Medicine

## 2016-05-30 LAB — URINE CULTURE: Colony Count: 25000

## 2016-06-03 ENCOUNTER — Encounter: Payer: Self-pay | Admitting: Family Medicine

## 2016-07-06 ENCOUNTER — Other Ambulatory Visit: Payer: Self-pay | Admitting: Family Medicine

## 2016-07-11 ENCOUNTER — Other Ambulatory Visit: Payer: Self-pay | Admitting: Family Medicine

## 2016-07-11 NOTE — Telephone Encounter (Signed)
Not a [facility] patient

## 2016-07-20 ENCOUNTER — Other Ambulatory Visit: Payer: Self-pay | Admitting: Family Medicine

## 2016-07-24 NOTE — Telephone Encounter (Signed)
2nd request.  Martin, Tamika L, RN  

## 2016-08-20 ENCOUNTER — Ambulatory Visit: Payer: Federal, State, Local not specified - PPO | Admitting: Family Medicine

## 2016-08-27 ENCOUNTER — Ambulatory Visit: Payer: Federal, State, Local not specified - PPO | Admitting: Family Medicine

## 2016-09-05 ENCOUNTER — Other Ambulatory Visit: Payer: Self-pay | Admitting: *Deleted

## 2016-09-05 NOTE — Telephone Encounter (Signed)
Refill request for 90 day supply.  Thos Matsumoto L, RN  

## 2016-09-06 ENCOUNTER — Other Ambulatory Visit: Payer: Self-pay | Admitting: Family Medicine

## 2016-09-06 MED ORDER — AMLODIPINE BESYLATE 10 MG PO TABS: 10.0000 mg | ORAL_TABLET | Freq: Every day | ORAL | 0 refills | Status: DC

## 2016-09-06 NOTE — Telephone Encounter (Signed)
Pt is calling and needs refill on her compact strips and her insulin called in. She has an upcoming appointment on 09/17/16. jw

## 2016-09-17 ENCOUNTER — Ambulatory Visit (INDEPENDENT_AMBULATORY_CARE_PROVIDER_SITE_OTHER): Payer: Federal, State, Local not specified - PPO | Admitting: Family Medicine

## 2016-09-17 ENCOUNTER — Encounter: Payer: Self-pay | Admitting: Family Medicine

## 2016-09-17 VITALS — BP 166/86 | HR 80 | Temp 98.0°F | Ht 67.0 in | Wt 153.2 lb

## 2016-09-17 DIAGNOSIS — E1165 Type 2 diabetes mellitus with hyperglycemia: Secondary | ICD-10-CM | POA: Diagnosis not present

## 2016-09-17 DIAGNOSIS — I10 Essential (primary) hypertension: Secondary | ICD-10-CM | POA: Diagnosis not present

## 2016-09-17 DIAGNOSIS — Z1159 Encounter for screening for other viral diseases: Secondary | ICD-10-CM | POA: Diagnosis not present

## 2016-09-17 DIAGNOSIS — IMO0001 Reserved for inherently not codable concepts without codable children: Secondary | ICD-10-CM

## 2016-09-17 DIAGNOSIS — I6529 Occlusion and stenosis of unspecified carotid artery: Secondary | ICD-10-CM

## 2016-09-17 DIAGNOSIS — E785 Hyperlipidemia, unspecified: Secondary | ICD-10-CM | POA: Diagnosis not present

## 2016-09-17 LAB — POCT GLYCOSYLATED HEMOGLOBIN (HGB A1C): Hemoglobin A1C: 12.8

## 2016-09-17 MED ORDER — METOPROLOL SUCCINATE ER 50 MG PO TB24: 50.0000 mg | ORAL_TABLET | Freq: Every day | ORAL | 1 refills | Status: DC

## 2016-09-17 MED ORDER — INSULIN NPH (HUMAN) (ISOPHANE) 100 UNIT/ML ~~LOC~~ SUSP: SUBCUTANEOUS | 5 refills | Status: DC

## 2016-09-17 NOTE — Patient Instructions (Signed)
It was great to see you again today!  For diabetes: - go up to 55 units in the morning and 35 units at night on your insulin - call us in 1 week to adjust your insulin further - checking urine today to check on your kidneys - On your way out, schedule an appointment one morning to come back for fasting labs. Do not eat or drink anything other than water the morning of your lab appointment until after your labs are drawn.   Health maintenance items: - think about pneumonia shot and shingles shots - call your insurance company to see if they'll cover a diabetic eye exam and a mammogram. These are both improtant.  For blood pressure: - sent in a long acting version of metoprolol that can be taken just once in the morning  Be well, Dr. Pollie MeyerMcIntyre

## 2016-09-17 NOTE — Progress Notes (Signed)
Date of Visit: 09/17/2016   HPI:  Destiny Morton presents for follow up. Her last visit with me was 03/01/16. At that visit had encouraged more regular follow up, though she is just now coming in 6 months later.  Diabetes - taking NPH 50 units in the morning, and 30 in the PM. Also takes metformin, though did not tolerate it. Has not had eye exam, states she cannot afford this. No low sugars. Typically run in the high 100's (190s-200s) while fasting. Sugar never gets below 100. Patient independently states that she knows she needs to exercise and eat better. Has a lot of dietary indiscretions.   Hypertension - taking metoprolol only once per day (tartrate) as she thinks it drops her pulse too low to take it twice daily.   Hyperlipidemia - taking fish oil. On simvastatin 20mg  daily though admits to taking it only sometimes.  ROS: See HPI. Denies chest pain, shortness of breath, swelling.   PMFSH: history of type 2 diabetes, hyperlipidemia, hypertension, allergic rhinitis, vit D deficiency, CVA, carotid artery stenosis  PHYSICAL EXAM: BP (!) 166/86   Pulse 80   Temp 98 F (36.7 C) (Oral)   Ht 5\' 7"  (1.702 m)   Wt 153 lb 3.2 oz (69.5 kg)   BMI 23.99 kg/m  Gen: NAD, pleasant, cooperative HEENT: normocephalic, atraumatic  Heart: regular rate and rhythm, no murmur Lungs: clear to auscultation bilaterally, normal work of breathing  Neuro: alert, grossly nonfocal, speech normal Ext: No appreciable lower extremity edema bilaterally   ASSESSMENT/PLAN:  Health maintenance:  -declined flu and pneumovax today -encouraged to call her insurance regarding payment for eye exam & mammogram - thinks she cannot afford these but advised they should be covered at least in part -patient will think about shingles vaccine and pneumovax -agreeable to hep C testing with labs  DM (diabetes mellitus), type 2, uncontrolled Uncontrolled with A1c of 12.8, down from 13.3. Again stressed in no uncertain terms  with patient the importance of regular follow up to titrate insulin, even if it is just by phone. Will increase NPH to 55 units in the AM & 35 in the PM. Follow up by phone in 1 week.  Cardiac: on aspirin, encouraged compliance with statin Renal: check urine microalbumin today (hx of intolerance to ARB) Eye: encouraged to get this Foot: UTD Immunizations: declines flu & pneumovax. Patient will consider shingles vaccine.   HYPERTENSION, BENIGN SYSTEMIC Only taking metoprolol daily, rather than twice daily Took medications right upon getting here today, likely explains why blood pressure is above goal. Previously controlled at nurse blood pressure check. Will switch to metoprolol succinate for better full day coverage.  Hyperlipidemia Check lipids today Titrate statin - encourage compliance  Occlusion and stenosis of carotid artery Noted history of carotid stenosis after today's visit. I spent time reviewing records dating back to 2010. Appears she had a watershed infarct presumably due to stenosed left carotid artery in 2010. It seems she followed up with Dr. Allyson Sabal (CV specialist), who ordered an MRA of her neck.   -MRA head: Critical stenosis supraclinoid ICA, left likely 90% or greater. Qualitatively diminished flow related enhancement the distal (skull base) L ICA implying a significant lesion. -MRA neck: Potentially flow reducing proximal left internal carotid artery plaque, estimated 75-90%; see comments above.  I don't see any notes scanned from Dr. Allyson Sabal subsequent to this test. Hard to tell as the [facility] was on a different EMR at that time. Will plan to discuss  more with patient at next visit. Certainly patient has not complained of stroke symptoms since becoming my patient several years ago. Still, want to make sure she is medically optimized for secondary prevention.  FOLLOW UP: Follow up in 1 week by phone for insulin adjustment  GrenadaBrittany J. Pollie MeyerMcIntyre, MD [facility]

## 2016-09-18 LAB — MICROALBUMIN / CREATININE URINE RATIO
Creatinine, Urine: 130 mg/dL (ref 20–320)
Microalb Creat Ratio: 18 mcg/mg creat (ref <30)
Microalb, Ur: 2.3 mg/dL

## 2016-09-20 NOTE — Assessment & Plan Note (Signed)
Only taking metoprolol daily, rather than twice daily Took medications right upon getting here today, likely explains why blood pressure is above goal. Previously controlled at nurse blood pressure check. Will switch to metoprolol succinate for better full day coverage.

## 2016-09-20 NOTE — Assessment & Plan Note (Signed)
Uncontrolled with A1c of 12.8, down from 13.3. Again stressed in no uncertain terms with patient the importance of regular follow up to titrate insulin, even if it is just by phone. Will increase NPH to 55 units in the AM & 35 in the PM. Follow up by phone in 1 week.  Cardiac: on aspirin, encouraged compliance with statin Renal: check urine microalbumin today (hx of intolerance to ARB) Eye: encouraged to get this Foot: UTD Immunizations: declines flu & pneumovax. Patient will consider shingles vaccine.

## 2016-09-20 NOTE — Assessment & Plan Note (Addendum)
Noted history of carotid stenosis after today's visit. I spent time reviewing records dating back to 2010. Appears she had a watershed infarct presumably due to stenosed left carotid artery in 2010. It seems she followed up with Dr. Allyson SabalBerry (CV specialist), who ordered an MRA of her neck.   -MRA head: Critical stenosis supraclinoid ICA, left likely 90% or greater. Qualitatively diminished flow related enhancement the distal (skull base) L ICA implying a significant lesion. -MRA neck: Potentially flow reducing proximal left internal carotid artery plaque, estimated 75-90%; see comments above.  I don't see any notes scanned from Dr. Allyson SabalBerry subsequent to this test. [facility] at that time. Will plan to discuss more with patient at next visit. Certainly patient has not complained of stroke symptoms since becoming my patient several years ago. Still, want to make sure she is medically optimized for secondary prevention.

## 2016-09-20 NOTE — Assessment & Plan Note (Signed)
Check lipids today Titrate statin - encourage compliance

## 2016-09-24 ENCOUNTER — Telehealth: Payer: Self-pay | Admitting: Family Medicine

## 2016-09-24 NOTE — Telephone Encounter (Signed)
Pt wanted to let Dr. Pollie MeyerMcIntyre know her sugars were 186 this morning. BP 132/85 and pulse 78. Pt has a BP check appointment on the 26th. Please advise. Thanks! ep

## 2016-09-26 NOTE — Telephone Encounter (Signed)
Returned call to patient Thanked her for calling with her sugars - this will be important to optimizing her diabetes long term  Patient states she is working on her diet (cutting out bread, sweets) and would prefer to leave her insulin dosing as it currently is. She is taking NPH 50 units in AM and 35 in PM (she thinks, did not have dosing in front of her at that moment).  Suspect she will need further uptitration of insulin in the coming weeks, but will allow time to see if dietary changes make a difference. She will call again in a few weeks and we will regroup then.  Reminded her of blood pressure check next Thursday AM. Also scheduled lab visit with her - reminded her to come fasting  Lastly informed of normal urine microalbumin.  Patient appreciative  Latrelle DodrillBrittany J Dajana Gehrig, MD

## 2016-10-03 ENCOUNTER — Other Ambulatory Visit: Payer: Federal, State, Local not specified - PPO

## 2016-10-11 ENCOUNTER — Other Ambulatory Visit: Payer: Self-pay | Admitting: Family Medicine

## 2016-10-21 ENCOUNTER — Encounter: Payer: Self-pay | Admitting: Family Medicine

## 2016-10-21 ENCOUNTER — Ambulatory Visit (INDEPENDENT_AMBULATORY_CARE_PROVIDER_SITE_OTHER): Payer: Federal, State, Local not specified - PPO | Admitting: Family Medicine

## 2016-10-21 DIAGNOSIS — B029 Zoster without complications: Secondary | ICD-10-CM | POA: Diagnosis not present

## 2016-10-21 MED ORDER — GABAPENTIN 300 MG PO CAPS: 300.0000 mg | ORAL_CAPSULE | Freq: Three times a day (TID) | ORAL | 1 refills | Status: DC

## 2016-10-21 MED ORDER — VALACYCLOVIR HCL 1 G PO TABS: 1000.0000 mg | ORAL_TABLET | Freq: Three times a day (TID) | ORAL | 0 refills | Status: DC

## 2016-10-21 NOTE — Assessment & Plan Note (Signed)
Patient presents for evaluation of rash that is class for shingles. -7 day course of Valtrex 1 mg TID prescribed -started Gabapentin 300 mg TID to help with associated nerve pain.

## 2016-10-21 NOTE — Progress Notes (Signed)
   Subjective:    Patient ID: Destiny Morton, female    DOB: 04/01/1956, 60 y.o.   MRN: [id number]  HPI 60 y/o female presents for evaluation of rash.  Rash Started 2 days ago, on right side, has spread to chest and back, burning sensation, reports some difficulty taking deep breath related to pain, no cough/fevers/sputum.    Review of Systems     Objective:   Physical Exam BP (!) 143/79   Pulse 82   Temp 98.1 F (36.7 C) (Oral)   Wt 150 lb (68 kg)   BMI 23.49 kg/m   Resp: CTAB, normal effort Skin: vesicles and crusted over lesions over T4 dermatome on the right side, wraps from midline chest all the way to midline back, does not cross midline  HIV negative in 2014    Assessment & Plan:  Shingles Patient presents for evaluation of rash that is class for shingles. -7 day course of Valtrex 1 mg TID prescribed -started Gabapentin 300 mg TID to help with associated nerve pain.

## 2016-10-21 NOTE — Patient Instructions (Addendum)
Shingles  Shingles, which is also known as herpes zoster, is an infection that causes a painful skin rash and fluid-filled blisters. Shingles is not related to genital herpes, which is a sexually transmitted infection.     Shingles only develops in people who:  · Have had chickenpox.  · Have received the chickenpox vaccine. (This is rare.)  CAUSES  Shingles is caused by varicella-zoster virus (VZV). This is the same virus that causes chickenpox. After exposure to VZV, the virus stays in the body in an inactive (dormant) state. Shingles develops if the virus reactivates. This can happen many years after the initial exposure to VZV. It is not known what causes this virus to reactivate.  RISK FACTORS  People who have had chickenpox or received the chickenpox vaccine are at risk for shingles. Infection is more common in people who:  · Are older than age 50.  · Have a weakened defense (immune) system, such as those with HIV, AIDS, or cancer.  · Are taking medicines that weaken the immune system, such as transplant medicines.  · Are under great stress.  SYMPTOMS  Early symptoms of this condition include itching, tingling, and pain in an area on your skin. Pain may be described as burning, stabbing, or throbbing.  A few days or weeks after symptoms start, a painful red rash appears, usually on one side of the body in a bandlike or beltlike pattern. The rash eventually turns into fluid-filled blisters that break open, scab over, and dry up in about 2-3 weeks.  At any time during the infection, you may also develop:  · A fever.  · Chills.  · A headache.  · An upset stomach.  DIAGNOSIS  This condition is diagnosed with a skin exam. Sometimes, skin or fluid samples are taken from the blisters before a diagnosis is made. These samples are examined under a microscope or sent to a lab for testing.  TREATMENT  There is no specific cure for this condition. Your health care provider will probably prescribe medicines to help you  manage pain, recover more quickly, and avoid long-term problems. Medicines may include:  · Antiviral drugs.  · Anti-inflammatory drugs.  · Pain medicines.  If the area involved is on your face, you may be referred to a specialist, such as an eye doctor (ophthalmologist) or an ear, nose, and throat (ENT) doctor to help you avoid eye problems, chronic pain, or disability.  HOME CARE INSTRUCTIONS  Medicines  · Take medicines only as directed by your health care provider.  · Apply an anti-itch or numbing cream to the affected area as directed by your health care provider.  Blister and Rash Care  · Take a cool bath or apply cool compresses to the area of the rash or blisters as directed by your health care provider. This may help with pain and itching.  · Keep your rash covered with a loose bandage (dressing). Wear loose-fitting clothing to help ease the pain of material rubbing against the rash.  · Keep your rash and blisters clean with mild soap and cool water or as directed by your health care provider.  · Check your rash every day for signs of infection. These include redness, swelling, and pain that lasts or increases.  · Do not pick your blisters.  · Do not scratch your rash.  General Instructions  · Rest as directed by your health care provider.  · Keep all follow-up visits as directed by your health care provider. This   is important.  · Until your blisters scab over, your infection can cause chickenpox in people who have never had it or been vaccinated against it. To prevent this from happening, avoid contact with other people, especially:    Babies.    Pregnant women.    Children who have eczema.    Elderly people who have transplants.    People who have chronic illnesses, such as leukemia or AIDS.  SEEK MEDICAL CARE IF:  · Your pain is not relieved with prescribed medicines.  · Your pain does not get better after the rash heals.  · Your rash looks infected. Signs of infection include redness, swelling, and pain  that lasts or increases.  SEEK IMMEDIATE MEDICAL CARE IF:  · The rash is on your face or nose.  · You have facial pain, pain around your eye area, or loss of feeling on one side of your face.  · You have ear pain or you have ringing in your ear.  · You have loss of taste.  · Your condition gets worse.     This information is not intended to replace advice given to you by your health care provider. Make sure you discuss any questions you have with your health care provider.     Document Released: 11/25/2005 Document Revised: 12/16/2014 Document Reviewed: 10/06/2014  Elsevier Interactive Patient Education ©2016 [employer]      Shingles  Shingles, which is also known as herpes zoster, is an infection that causes a painful skin rash and fluid-filled blisters. Shingles is not related to genital herpes, which is a sexually transmitted infection.     Shingles only develops in people who:  · Have had chickenpox.  · Have received the chickenpox vaccine. (This is rare.)  CAUSES  Shingles is caused by varicella-zoster virus (VZV). This is the same virus that causes chickenpox. After exposure to VZV, the virus stays in the body in an inactive (dormant) state. Shingles develops if the virus reactivates. This can happen many years after the initial exposure to VZV. It is not known what causes this virus to reactivate.  RISK FACTORS  People who have had chickenpox or received the chickenpox vaccine are at risk for shingles. Infection is more common in people who:  · Are older than age 50.  · Have a weakened defense (immune) system, such as those with HIV, AIDS, or cancer.  · Are taking medicines that weaken the immune system, such as transplant medicines.  · Are under great stress.  SYMPTOMS  Early symptoms of this condition include itching, tingling, and pain in an area on your skin. Pain may be described as burning, stabbing, or throbbing.  A few days or weeks after symptoms start, a painful red rash appears, usually on one  side of the body in a bandlike or beltlike pattern. The rash eventually turns into fluid-filled blisters that break open, scab over, and dry up in about 2-3 weeks.  At any time during the infection, you may also develop:  · A fever.  · Chills.  · A headache.  · An upset stomach.  DIAGNOSIS  This condition is diagnosed with a skin exam. Sometimes, skin or fluid samples are taken from the blisters before a diagnosis is made. These samples are examined under a microscope or sent to a lab for testing.  TREATMENT  There is no specific cure for this condition. Your health care provider will probably prescribe medicines to help you manage pain, recover more quickly,   and avoid long-term problems. Medicines may include:  · Antiviral drugs.  · Anti-inflammatory drugs.  · Pain medicines.  If the area involved is on your face, you may be referred to a specialist, such as an eye doctor (ophthalmologist) or an ear, nose, and throat (ENT) doctor to help you avoid eye problems, chronic pain, or disability.  HOME CARE INSTRUCTIONS  Medicines  · Take medicines only as directed by your health care provider.  · Apply an anti-itch or numbing cream to the affected area as directed by your health care provider.  Blister and Rash Care  · Take a cool bath or apply cool compresses to the area of the rash or blisters as directed by your health care provider. This may help with pain and itching.  · Keep your rash covered with a loose bandage (dressing). Wear loose-fitting clothing to help ease the pain of material rubbing against the rash.  · Keep your rash and blisters clean with mild soap and cool water or as directed by your health care provider.  · Check your rash every day for signs of infection. These include redness, swelling, and pain that lasts or increases.  · Do not pick your blisters.  · Do not scratch your rash.  General Instructions  · Rest as directed by your health care provider.  · Keep all follow-up visits as directed by your  health care provider. This is important.  · Until your blisters scab over, your infection can cause chickenpox in people who have never had it or been vaccinated against it. To prevent this from happening, avoid contact with other people, especially:    Babies.    Pregnant women.    Children who have eczema.    Elderly people who have transplants.    People who have chronic illnesses, such as leukemia or AIDS.  SEEK MEDICAL CARE IF:  · Your pain is not relieved with prescribed medicines.  · Your pain does not get better after the rash heals.  · Your rash looks infected. Signs of infection include redness, swelling, and pain that lasts or increases.  SEEK IMMEDIATE MEDICAL CARE IF:  · The rash is on your face or nose.  · You have facial pain, pain around your eye area, or loss of feeling on one side of your face.  · You have ear pain or you have ringing in your ear.  · You have loss of taste.  · Your condition gets worse.     This information is not intended to replace advice given to you by your health care provider. Make sure you discuss any questions you have with your health care provider.     Document Released: 11/25/2005 Document Revised: 12/16/2014 Document Reviewed: 10/06/2014  Elsevier Interactive Patient Education ©2016 [employer]

## 2016-10-29 ENCOUNTER — Telehealth: Payer: Self-pay | Admitting: Internal Medicine

## 2016-10-29 ENCOUNTER — Ambulatory Visit: Payer: Federal, State, Local not specified - PPO | Admitting: Family Medicine

## 2016-10-29 ENCOUNTER — Telehealth: Payer: Self-pay | Admitting: Family Medicine

## 2016-10-29 NOTE — Telephone Encounter (Signed)
Pt called to reschedule her appointment until next week. She would like to know if the doctor could call in something stronger than the Tylenol. jw

## 2016-10-29 NOTE — Telephone Encounter (Signed)
Patient seen last week by Dr. Randolm IdolFletke for shingles rash that is still causing pain, will forward to PCP.

## 2016-10-29 NOTE — Telephone Encounter (Signed)
**  After Hours/ Emergency Line Call*  Received a call on the after hours line from Destiny Morton. She states she called her PCP today, asking for something stronger for pain medications. Pt states that she was diagnosed with shingles by Dr. Randolm IdolFletke last week. She states she is in unbearable pain. She did not hear back from her PCP today, so she was wanting to know if I could call her in a stronger pain medication. I explained that we cannot call in pain medications over the phone. I stated that I could send a message to Dr. Pollie MeyerMcIntyre and she would likely get back to her in the morning. Pt stated that she is in too much pain to make it through another night and that she will just go to the emergency room instead.  Will forward to PCP.  Hilton SinclairKaty D Mayo, MD PGY-2, [facility]

## 2016-10-29 NOTE — Telephone Encounter (Signed)
Pt is calling to see if the doctor had decided to call in some stronger medication. jw

## 2016-10-30 MED ORDER — TRAMADOL HCL 50 MG PO TABS: 50.0000 mg | ORAL_TABLET | Freq: Three times a day (TID) | ORAL | 0 refills | Status: DC | PRN

## 2016-10-30 NOTE — Telephone Encounter (Signed)
Returned call to patient. Tylenol is not helping. Having bad pain with the shingles.  Patient denies history of seizure in the past. I have called in tramadol 50mg  one tablet q8hprn #30, no refills Patient appreciative.  Latrelle DodrillBrittany J Adasha Boehme, MD

## 2016-11-05 ENCOUNTER — Ambulatory Visit: Payer: Federal, State, Local not specified - PPO | Admitting: Family Medicine

## 2016-11-12 ENCOUNTER — Telehealth: Payer: Self-pay | Admitting: Family Medicine

## 2016-11-12 NOTE — Telephone Encounter (Signed)
Would like ointment to go on the shingles.   [facility] [address]. Please let pt know when this is called in

## 2016-11-13 MED ORDER — LIDOCAINE 4 % EX CREA: TOPICAL_CREAM | CUTANEOUS | 0 refills | Status: DC

## 2016-11-13 NOTE — Telephone Encounter (Signed)
Pt informed and declined when I tried to make her an appt. She said she would call back and make an appt. Alizaya Oshea Bruna PotterBlount, CMA

## 2016-11-13 NOTE — Telephone Encounter (Signed)
Rx for topical lidocaine cream sent in for patient.  Please inform patient and also advise her that she has now no-showed to TWO follow up appts since this shingles diagnosis. She needs to be seen in the office for this and for her diabetes. At minimum, should cancel appts if she cannot attend them, as she is taking up appointment spaces that other patients could use.  Latrelle DodrillBrittany J Antwain Caliendo, MD

## 2016-12-17 ENCOUNTER — Ambulatory Visit (INDEPENDENT_AMBULATORY_CARE_PROVIDER_SITE_OTHER): Payer: Federal, State, Local not specified - PPO | Admitting: Family Medicine

## 2016-12-17 ENCOUNTER — Encounter: Payer: Self-pay | Admitting: Family Medicine

## 2016-12-17 VITALS — BP 132/78 | HR 77 | Temp 98.3°F | Ht 67.0 in | Wt 144.0 lb

## 2016-12-17 DIAGNOSIS — IMO0001 Reserved for inherently not codable concepts without codable children: Secondary | ICD-10-CM

## 2016-12-17 DIAGNOSIS — I10 Essential (primary) hypertension: Secondary | ICD-10-CM

## 2016-12-17 DIAGNOSIS — E1165 Type 2 diabetes mellitus with hyperglycemia: Secondary | ICD-10-CM

## 2016-12-17 DIAGNOSIS — I6529 Occlusion and stenosis of unspecified carotid artery: Secondary | ICD-10-CM

## 2016-12-17 DIAGNOSIS — B0229 Other postherpetic nervous system involvement: Secondary | ICD-10-CM

## 2016-12-17 DIAGNOSIS — Z1159 Encounter for screening for other viral diseases: Secondary | ICD-10-CM

## 2016-12-17 DIAGNOSIS — E785 Hyperlipidemia, unspecified: Secondary | ICD-10-CM

## 2016-12-17 LAB — COMPLETE METABOLIC PANEL WITH GFR
ALT: 14 U/L (ref 6–29)
AST: 12 U/L (ref 10–35)
Albumin: 3.9 g/dL (ref 3.6–5.1)
Alkaline Phosphatase: 87 U/L (ref 33–130)
BUN: 14 mg/dL (ref 7–25)
CO2: 25 mmol/L (ref 20–31)
Calcium: 9.6 mg/dL (ref 8.6–10.4)
Chloride: 101 mmol/L (ref 98–110)
Creat: 0.82 mg/dL (ref 0.50–0.99)
GFR, Est African American: 89 mL/min (ref >=60)
GFR, Est Non African American: 77 mL/min (ref >=60)
Glucose, Bld: 298 mg/dL — ABNORMAL HIGH (ref 65–99)
Potassium: 3.9 mmol/L (ref 3.5–5.3)
Sodium: 139 mmol/L (ref 135–146)
Total Bilirubin: 0.7 mg/dL (ref 0.2–1.2)
Total Protein: 7.3 g/dL (ref 6.1–8.1)

## 2016-12-17 LAB — LIPID PANEL
Cholesterol: 195 mg/dL (ref <200)
HDL: 40 mg/dL — ABNORMAL LOW (ref >50)
LDL Cholesterol: 135 mg/dL — ABNORMAL HIGH (ref <100)
Total CHOL/HDL Ratio: 4.9 Ratio (ref <5.0)
Triglycerides: 101 mg/dL (ref <150)
VLDL: 20 mg/dL (ref <30)

## 2016-12-17 LAB — POCT GLYCOSYLATED HEMOGLOBIN (HGB A1C): Hemoglobin A1C: 11.4

## 2016-12-17 MED ORDER — AMITRIPTYLINE HCL 10 MG PO TABS
10.0000 mg | ORAL_TABLET | Freq: Every day | ORAL | 1 refills | Status: DC
Start: 2016-12-17 — End: 2017-09-26

## 2016-12-17 NOTE — Progress Notes (Signed)
Date of Visit: 12/17/2016   HPI:  Patient presents for routine follow up of chronic medical issues.  Diabetes - currently taking 48 units of NPH in the morning and 30 in the afternoon. She is on this lower dose because she cannot afford to buy more of it. She has to ration her supply. Sometimes skips the PM dosing in order to conserve her supply of NPH. Lowest sugar she gets is in the 170s. She believes her social security will kick in in the next month or so, and that she'll have a better financial situation then. No chest pain or shortness of breath.   Hypertension - compliant with amlodipine 10mg  daily and metoprolol succinate 50mg  daily.   Hyperlipidemia - not taking statin regularly. States she is willing to do this, wants to get cholesterol checked first.  Shingles pain - was diagnosed with shingles in November 2017. Has had severe persistent pain in her skin on the R side since then. Could not afford the topical lidocaine cream I sent in (it was about 50 dollars). Had previously been prescribed gabapentin, which she could not tolerate because it made her dizzy.  Carotid stenosis/prior CVA - discussed this with patient today as after last visit on review of records, noted that she had a history of significant carotid stenosis and had been referred years ago to vascular surgery (Dr. Allyson SabalBerry). No records were visible as to the outcome of her appointment with Dr. Allyson SabalBerry. Patient reports she believes she saw a vascular specialist and they told her nothing needs to be done about her neck. She couldn't give more info than this.  ROS: See HPI.  PMFSH: history of type 2 diabetes, hyperlipidemia, prior CVA, carotid stenosis,   PHYSICAL EXAM: BP 132/78   Pulse 77   Temp 98.3 F (36.8 C) (Oral)   Ht 5\' 7"  (1.702 m)   Wt 144 lb (65.3 kg)   BMI 22.55 kg/m  Gen: NAD, pleasant, cooperative HEENT: normocephalic, atraumatic, moist mucous membranes  Heart: regular rate and rhythm, no murmur Lungs:  clear to auscultation bilaterally, normal work of breathing  Neuro: alert grossly nonfocal, speech normal Ext: No appreciable lower extremity edema bilaterally  Skin: hyperpigmented changes along right breast, wrapping round right flank and going to right back, not crossing midline on either anterior or posterior surface. Skin is very tender to palpation even to light touch. No redness or signs of infection.  ASSESSMENT/PLAN:  Health maintenance:  -continues to decline pneumovax and flu vaccine -would delay any discussion of zostavax for 6-12 months after recent shingles infection (would consider discussing again at least after May 2017) -encouraged mammogram and eye appointment but patient will wait until her financial situation is better before getting these -check hep C antibody with labs today  DM (diabetes mellitus), type 2, uncontrolled (HCC) A1c slightly improved today at 11.4 (previously 12.8). Congratulated patient on this improvement, despite diabetes being uncontrolled. Unfortunately financial burdens have kept her from being able to dose her insulin as recommended. She will continue what she's been doing, as she anticipates her financial situation will improve in the next month or two. Once this is better, she'll follow up with me to continue adjusting her medications.   Cardiac: on aspirin, check lipids today Renal: recent normal microalbumin Eye: encouraged, though she will wait until finances are better Foot: UTD Immunizations: declines flu & pneumovax. zostavax not indicated due to recent shingles infection (wait 6-12 months before considering)   Hyperlipidemia Check lipids today. Not presently  taking statin. Anticipate she will need one given uncontrolled diabetes and known CV disease (concern for prior carotid disease possibly causing stroke).  HYPERTENSION, BENIGN SYSTEMIC Well controlled. Continue current regimen.   Postherpetic neuralgia Unfortunately it seems she  has developed fairly significant postherpetic neuralgia. Did not tolerate gabapentin. Will cautiously initiate low dose amitryptiline 10mg  nightly. Discussed necessity of alerting [place] immediately if she develops any unwanted side effects of this medication (specifically if she has any chest pain or shortness of breath).  Occlusion and stenosis of carotid artery The outcome of her evaluation by Dr. Allyson Sabal remains unclear to me after discussing with patient today. I was able to find some old records scanned in from March and April 2010 which showed her office visits with him, but the plan on which the notes ended was that an MRA of her neck was to be obtained. There are no subsequent notes scanned in. We will request records to clarify.  FOLLOW UP: Follow up in 1 mo for above issues  Destiny J. Pollie Meyer, MD [facility]

## 2016-12-17 NOTE — Patient Instructions (Addendum)
Checking labwork today I'll call you so we can talk about your cholesterol  Blood pressure looks pretty good  Come back once finances are better (hopefully in a month or so) so we can work on getting your diabetes under better control  For postherpetic neuralgia, start amitryptiline 10mg  daily - I sent this in for you Take at night. Might make you sleepy If you get chest pain or shortness of breath, or any other issues stop taking it and call our office  I'll try to get records regarding the issue with the blood vessel in your neck from 2010  Be well, Dr. Pollie MeyerMcIntyre l

## 2016-12-18 LAB — HEPATITIS C ANTIBODY: HCV Ab: NEGATIVE

## 2016-12-19 DIAGNOSIS — B0229 Other postherpetic nervous system involvement: Secondary | ICD-10-CM | POA: Insufficient documentation

## 2016-12-19 NOTE — Assessment & Plan Note (Signed)
Check lipids today. Not presently taking statin. Anticipate she will need one given uncontrolled diabetes and known CV disease (concern for prior carotid disease possibly causing stroke).

## 2016-12-19 NOTE — Assessment & Plan Note (Signed)
Well-controlled.  Continue current regimen. 

## 2016-12-19 NOTE — Assessment & Plan Note (Signed)
The outcome of her evaluation by Dr. Allyson SabalBerry remains unclear to me after discussing with patient today. I was able to find some old records scanned in from March and April 2010 which showed her office visits with him, but the plan on which the notes ended was that an MRA of her neck was to be obtained. There are no subsequent notes scanned in. We will request records to clarify.

## 2016-12-19 NOTE — Assessment & Plan Note (Signed)
Unfortunately it seems she has developed fairly significant postherpetic neuralgia. Did not tolerate gabapentin. Will cautiously initiate low dose amitryptiline 10mg  nightly. Discussed necessity of alerting us immediately if she develops any unwanted side effects of this medication (specifically if she has any chest pain or shortness of breath).

## 2016-12-19 NOTE — Assessment & Plan Note (Signed)
A1c slightly improved today at 11.4 (previously 12.8). Congratulated patient on this improvement, despite diabetes being uncontrolled. Unfortunately financial burdens have kept her from being able to dose her insulin as recommended. She will continue what she's been doing, as she anticipates her financial situation will improve in the next month or two. Once this is better, she'll follow up with me to continue adjusting her medications.   Cardiac: on aspirin, check lipids today Renal: recent normal microalbumin Eye: encouraged, though she will wait until finances are better Foot: UTD Immunizations: declines flu & pneumovax. zostavax not indicated due to recent shingles infection (wait 6-12 months before considering)

## 2016-12-23 ENCOUNTER — Other Ambulatory Visit: Payer: Self-pay | Admitting: Family Medicine

## 2017-01-11 ENCOUNTER — Other Ambulatory Visit: Payer: Self-pay | Admitting: Family Medicine

## 2017-01-13 ENCOUNTER — Encounter: Payer: Self-pay | Admitting: Family Medicine

## 2017-01-13 DIAGNOSIS — Z91199 Patient's noncompliance with other medical treatment and regimen due to unspecified reason: Secondary | ICD-10-CM | POA: Insufficient documentation

## 2017-01-13 DIAGNOSIS — Z5329 Procedure and treatment not carried out because of patient's decision for other reasons: Secondary | ICD-10-CM

## 2017-01-15 ENCOUNTER — Telehealth: Payer: Self-pay | Admitting: Family Medicine

## 2017-01-15 NOTE — Telephone Encounter (Signed)
Attempted to reach patient to discuss lipids She was not at home - female answered, I advised I would call her later Called cell phone & left voicemail (ROI on file for this) asking her to call me back at the clinic  Latrelle DodrillBrittany J Tyria Springer, MD

## 2017-01-16 NOTE — Telephone Encounter (Signed)
Needs refill accu check lancets  [facility] [address]

## 2017-01-17 ENCOUNTER — Encounter: Payer: Self-pay | Admitting: Family Medicine

## 2017-01-17 MED ORDER — ACCU-CHEK SOFTCLIX LANCETS MISC: 12 refills | Status: DC

## 2017-01-17 MED ORDER — ATORVASTATIN CALCIUM 80 MG PO TABS: 80.0000 mg | ORAL_TABLET | Freq: Every day | ORAL | 11 refills | Status: DC

## 2017-01-17 NOTE — Telephone Encounter (Signed)
Called patient and reached her Explained need for high intensity statin with her risk factors and known history of stroke Will rx atorvastatin 80mg  daily - she will let me know if this is too expensive Also sent in rx for lancets as requested Will send letter with lab results to patient per her request She was appreciative  Latrelle DodrillBrittany J McIntyre, MD

## 2017-03-31 ENCOUNTER — Other Ambulatory Visit: Payer: Self-pay | Admitting: Family Medicine

## 2017-04-19 ENCOUNTER — Other Ambulatory Visit: Payer: Self-pay | Admitting: Family Medicine

## 2017-04-23 NOTE — Telephone Encounter (Signed)
Pt is calling for a refill on her Humulin. She said that the pharmacy has been waiting three days. jw

## 2017-05-13 ENCOUNTER — Ambulatory Visit: Payer: Federal, State, Local not specified - PPO | Admitting: Family Medicine

## 2017-07-03 ENCOUNTER — Other Ambulatory Visit: Payer: Self-pay | Admitting: Family Medicine

## 2017-07-21 ENCOUNTER — Other Ambulatory Visit: Payer: Self-pay | Admitting: Family Medicine

## 2017-08-03 ENCOUNTER — Other Ambulatory Visit: Payer: Self-pay | Admitting: Family Medicine

## 2017-08-04 NOTE — Telephone Encounter (Signed)
Red team, please ask patient to schedule follow up with me. I will refill for her. Thanks, [name], MD

## 2017-08-05 NOTE — Telephone Encounter (Signed)
Patient informed, PCP's first available isnt until October. Appointment scheduled for 10/19.

## 2017-09-11 ENCOUNTER — Other Ambulatory Visit: Payer: Self-pay | Admitting: Family Medicine

## 2017-09-26 ENCOUNTER — Telehealth: Payer: Self-pay

## 2017-09-26 ENCOUNTER — Ambulatory Visit (INDEPENDENT_AMBULATORY_CARE_PROVIDER_SITE_OTHER): Payer: Federal, State, Local not specified - PPO | Admitting: Family Medicine

## 2017-09-26 VITALS — BP 124/72 | HR 72 | Temp 98.5°F | Ht 67.0 in | Wt 150.2 lb

## 2017-09-26 DIAGNOSIS — E11649 Type 2 diabetes mellitus with hypoglycemia without coma: Secondary | ICD-10-CM

## 2017-09-26 DIAGNOSIS — R300 Dysuria: Secondary | ICD-10-CM | POA: Diagnosis not present

## 2017-09-26 DIAGNOSIS — N898 Other specified noninflammatory disorders of vagina: Secondary | ICD-10-CM | POA: Diagnosis not present

## 2017-09-26 DIAGNOSIS — Z1231 Encounter for screening mammogram for malignant neoplasm of breast: Secondary | ICD-10-CM

## 2017-09-26 DIAGNOSIS — I10 Essential (primary) hypertension: Secondary | ICD-10-CM | POA: Diagnosis not present

## 2017-09-26 DIAGNOSIS — Z1239 Encounter for other screening for malignant neoplasm of breast: Secondary | ICD-10-CM

## 2017-09-26 DIAGNOSIS — I6529 Occlusion and stenosis of unspecified carotid artery: Secondary | ICD-10-CM | POA: Diagnosis not present

## 2017-09-26 DIAGNOSIS — E785 Hyperlipidemia, unspecified: Secondary | ICD-10-CM | POA: Diagnosis not present

## 2017-09-26 LAB — POCT URINALYSIS DIP (MANUAL ENTRY)
Bilirubin, UA: NEGATIVE
Blood, UA: NEGATIVE
Glucose, UA: 500 mg/dL — AB
Leukocytes, UA: NEGATIVE
Nitrite, UA: NEGATIVE
Protein Ur, POC: NEGATIVE mg/dL
Spec Grav, UA: 1.02 (ref 1.010–1.025)
Urobilinogen, UA: 0.2 E.U./dL
pH, UA: 6 (ref 5.0–8.0)

## 2017-09-26 LAB — POCT GLYCOSYLATED HEMOGLOBIN (HGB A1C): Hemoglobin A1C: 12.2

## 2017-09-26 LAB — POCT WET PREP (WET MOUNT)
Clue Cells Wet Prep Whiff POC: NEGATIVE
Trichomonas Wet Prep HPF POC: ABSENT

## 2017-09-26 MED ORDER — AMLODIPINE BESYLATE 10 MG PO TABS: 10.0000 mg | ORAL_TABLET | Freq: Every day | ORAL | 2 refills | Status: DC

## 2017-09-26 MED ORDER — FLUCONAZOLE 150 MG PO TABS: 150.0000 mg | ORAL_TABLET | Freq: Once | ORAL | 0 refills | Status: AC

## 2017-09-26 MED ORDER — INSULIN NPH (HUMAN) (ISOPHANE) 100 UNIT/ML ~~LOC~~ SUSP: SUBCUTANEOUS | 3 refills | Status: DC

## 2017-09-26 MED ORDER — CLOTRIMAZOLE 1 % VA CREA: 1.0000 | TOPICAL_CREAM | Freq: Every day | VAGINAL | 0 refills | Status: DC

## 2017-09-26 MED ORDER — ATORVASTATIN CALCIUM 40 MG PO TABS: 40.0000 mg | ORAL_TABLET | Freq: Every day | ORAL | 3 refills | Status: DC

## 2017-09-26 NOTE — Telephone Encounter (Signed)
Pt returning a call. No message left. Please call her back on [phone]. Sunday SpillersSharon T Saunders, CMA

## 2017-09-26 NOTE — Progress Notes (Addendum)
Date of Visit: 09/26/2017   HPI:  Diabetes - currently taking humulin N 48-50 units in the morning and 35 units at night. Very rarely gets a low sugar. Lowest she's gotten was 80, but she was symptomatic at that time. Believes her A1c is higher because of dietary indiscretion, admits to eating lots of snacks/sugary things. Does not feel that meeting with a nutritionist would be helpful as she think it is a "mental battle" that she just has within herself.  Hypertension - currently taking amlodipine 10mg  daily and metoprolol tartrate 50mg  daily (prescribed as 50mg  twice daily but she takes it once daily as she tolerates the medication better that way). Reports getting cold frequently.  Hyperlipidemia - previously prescribed atorvastatin 80mg  daily, which patient states she is not taking because it made her body hurt. Willing to try it at a lower dose.  ? Yeast infection/UTI - patient believes she may have a UTI or yeast infection. Prefers to self-swab for wet prep today. Having lots of burning and itching in genital area. No bleeding or rashes. Has mild discharge. Area burns even when not urinating.  Constipation - while doing medication reconciliation patient reported she is constipated much of the time. Takes miralax on occasion.  ROS: See HPI.  PMFSH: history of diabetes, hyperlipidemia, hypertension, carotid stenosis, postherpetic neuralgia, vit D deficiency, CVA  PHYSICAL EXAM: BP 124/72   Pulse 72   Temp 98.5 F (36.9 C) (Oral)   Ht 5\' 7"  (1.702 m)   Wt 150 lb 3.2 oz (68.1 kg)   SpO2 99%   BMI 23.52 kg/m  Gen: no acute distress, pleasant, cooperative HEENT: normocephalic, atraumatic, moist mucous membranes. No carotid bruits heard on either side. Heart: regular rate and rhythm, no murmur Lungs: clear to auscultation bilaterally, normal work of breathing  Neuro: grossly nonfocal, speech normal Ext: No appreciable lower extremity edema bilaterally  diabetes foot exam done - see  flowsheets  ASSESSMENT/PLAN:  Health maintenance:  - pneumovax: declined - eye exam: will refer for this - mammogram: gave handout on scheduling, will also order - foot exam: done today, advised to check feet daily - flu vaccine: offered today, patient declined - urine microalbumin: ordered - colon cancer screening: discussed with patient via phone after visit as I could not actually find her colonoscopy report. She denies ever having had a colonoscopy. She would like to do stool testing. She will call for a lab appointment to do FIT testing.  DM (diabetes mellitus), type 2, uncontrolled (HCC) Remains uncontrolled. Encouraged compliance with dietary changes. She is hesitant to go up on her PM dose of insulin. Patient to schedule in pharmacy clinic to address poorly controlled diabetes & adjust medications further.   Cardiac: on aspirin. Restart statin at lower dose to see if patient tolerates. Renal: check urine microalbumin today Eye: refer for exam Foot: done today, decreased sensation, encouraged checking feet daily Immunizations: declined pneumovax and flu vaccines today   Carotid stenosis Patient reports no further workup was deemed necessary for this years ago. I was unable to get records (we never received them after requesting them). No bruits on exam today. Discussed with patient that I recommend at some point in the future we at least get a carotid doppler. She is agreeable to discussing this at a future visit. We are also restarting statin today. Continue aspirin.  Hyperlipidemia Attempt to restart atorvastatin at 40mg  (lower dose) to see if she'll tolerate it.  HYPERTENSION, BENIGN SYSTEMIC Well controlled. Continue current regimen.  Vulvar burning - self-collected wet prep consistent with yeast. UA without nitrites or leuks, doubt UTI. History is also consistent with yeast. Sent in rx for diflucan 150mg  x1, repeat in 3 days as needed, but patient actually prefers topical  vaginal therapy. Prescribed clotrimazole.  Constipation Encouraged increasing miralax to daily use, titrate as needed for bowel movement's Discussed with patient via phone after visit as I could not actually find her colonoscopy report. She denies ever having had a colonoscopy. She would prefer to do stool testing. She will call for a lab appointment to do FIT testing.  Note - did not address her report of feeling cold due to addressing so many other concerns today. Consider TSH at future visit, patient agreeable to waiting on checking labwork.  FOLLOW UP: Schedule in pharmacy clinic Follow up in 3 months with me for routine medical problems, sooner if deemed necessary by Dr. Raymondo BandKoval.  Estevan RyderBrittany J. Pollie MeyerMcIntyre, MD [facility]

## 2017-09-26 NOTE — Assessment & Plan Note (Addendum)
Patient reports no further workup was deemed necessary for this years ago. I was unable to get records (we never received them after requesting them). No bruits on exam today. Discussed with patient that I recommend at some point in the future we at least get a carotid doppler. She is agreeable to discussing this at a future visit. We are also restarting statin today. Continue aspirin.

## 2017-09-26 NOTE — Patient Instructions (Addendum)
See the handout on how to schedule your mammogram. This is an important test to screen for breast cancer.  Try the miralax daily to see if it helps with constipation  Referring for eye exam  Sent in refills of medications You have a yeast infection - sent in diflucan for you to take  Start atorvastatin (lipitor) 40mg  daily  Schedule with Dr. Raymondo BandKoval in pharmacy clinic for your diabetes   Follow up with me in 3 months, or sooner if Dr. Raymondo BandKoval tells you to see me before then  Be well, Dr. Pollie MeyerMcIntyre

## 2017-09-26 NOTE — Assessment & Plan Note (Addendum)
Remains uncontrolled. Encouraged compliance with dietary changes. She is hesitant to go up on her PM dose of insulin. Patient to schedule in pharmacy clinic to address poorly controlled diabetes & adjust medications further.   Cardiac: on aspirin. Restart statin at lower dose to see if patient tolerates. Renal: check urine microalbumin today Eye: refer for exam Foot: done today, decreased sensation, encouraged checking feet daily Immunizations: declined pneumovax and flu vaccines today

## 2017-09-26 NOTE — Telephone Encounter (Signed)
Spoke with patient - see office visit note from this AM, will put what I discussed with her in there. Destiny DodrillBrittany J Kamea Dacosta, MD

## 2017-09-26 NOTE — Assessment & Plan Note (Signed)
Attempt to restart atorvastatin at 40mg  (lower dose) to see if she'll tolerate it.

## 2017-09-26 NOTE — Assessment & Plan Note (Signed)
Well-controlled.  Continue current regimen. 

## 2017-09-27 LAB — MICROALBUMIN / CREATININE URINE RATIO
Creatinine, Urine: 152.1 mg/dL
Microalb/Creat Ratio: 10.1 mg/g creat (ref 0.0–30.0)
Microalbumin, Urine: 15.4 ug/mL

## 2017-09-28 ENCOUNTER — Encounter: Payer: Self-pay | Admitting: Family Medicine

## 2017-10-01 ENCOUNTER — Other Ambulatory Visit: Payer: Self-pay | Admitting: Family Medicine

## 2017-10-13 ENCOUNTER — Other Ambulatory Visit: Payer: Self-pay | Admitting: Family Medicine

## 2017-10-13 MED ORDER — AMLODIPINE BESYLATE 10 MG PO TABS: 10.0000 mg | ORAL_TABLET | Freq: Every day | ORAL | 2 refills | Status: DC

## 2017-10-14 ENCOUNTER — Other Ambulatory Visit: Payer: Self-pay | Admitting: Family Medicine

## 2017-10-21 ENCOUNTER — Other Ambulatory Visit: Payer: Self-pay | Admitting: Family Medicine

## 2017-10-28 ENCOUNTER — Telehealth: Payer: Self-pay | Admitting: *Deleted

## 2017-10-28 NOTE — Telephone Encounter (Signed)
Red team, Can you ask patient if she took the diflucan or just did the vaginal cream? If she did not take the diflucan she can try this. The rx should be at her pharmacy. If she did take it, I can call it in again. If a second round doesn't improve her symptoms she'll need to come in to be seen.  Thanks, Latrelle DodrillBrittany J McIntyre, MD

## 2017-10-28 NOTE — Telephone Encounter (Signed)
Patient left message on nurse line stating she is still having yeast infection symptoms despite treatment last month. Wants to know if something else can be called in.

## 2017-11-05 MED ORDER — CLOTRIMAZOLE 1 % VA CREA: 1.0000 | TOPICAL_CREAM | Freq: Every day | VAGINAL | 0 refills | Status: DC

## 2017-11-05 NOTE — Telephone Encounter (Signed)
Pt states that she would prefer the cream called in. Deseree Bruna PotterBlount, CMA

## 2017-11-05 NOTE — Telephone Encounter (Signed)
rx sent Destiny Holsomback J Damere Brandenburg, MD  

## 2017-11-29 ENCOUNTER — Other Ambulatory Visit: Payer: Self-pay | Admitting: Family Medicine

## 2017-12-26 ENCOUNTER — Ambulatory Visit: Payer: Federal, State, Local not specified - PPO | Admitting: Family Medicine

## 2018-02-18 ENCOUNTER — Other Ambulatory Visit: Payer: Self-pay | Admitting: Family Medicine

## 2018-02-26 ENCOUNTER — Other Ambulatory Visit: Payer: Self-pay | Admitting: Family Medicine

## 2018-03-03 ENCOUNTER — Other Ambulatory Visit: Payer: Self-pay

## 2018-03-03 ENCOUNTER — Ambulatory Visit (INDEPENDENT_AMBULATORY_CARE_PROVIDER_SITE_OTHER): Payer: Federal, State, Local not specified - PPO | Admitting: Family Medicine

## 2018-03-03 VITALS — BP 136/72 | HR 77 | Temp 98.3°F | Ht 67.0 in | Wt 154.6 lb

## 2018-03-03 DIAGNOSIS — I10 Essential (primary) hypertension: Secondary | ICD-10-CM | POA: Diagnosis not present

## 2018-03-03 DIAGNOSIS — E785 Hyperlipidemia, unspecified: Secondary | ICD-10-CM

## 2018-03-03 DIAGNOSIS — E1165 Type 2 diabetes mellitus with hyperglycemia: Secondary | ICD-10-CM

## 2018-03-03 DIAGNOSIS — R300 Dysuria: Secondary | ICD-10-CM | POA: Diagnosis not present

## 2018-03-03 LAB — POCT GLYCOSYLATED HEMOGLOBIN (HGB A1C): Hemoglobin A1C: 11.8

## 2018-03-03 LAB — POCT URINALYSIS DIP (MANUAL ENTRY)
Bilirubin, UA: NEGATIVE
Blood, UA: NEGATIVE
Glucose, UA: 100 mg/dL — AB
Ketones, POC UA: NEGATIVE mg/dL
Leukocytes, UA: NEGATIVE
Nitrite, UA: NEGATIVE
Protein Ur, POC: NEGATIVE mg/dL
Spec Grav, UA: 1.02 (ref 1.010–1.025)
Urobilinogen, UA: 0.2 E.U./dL
pH, UA: 6.5 (ref 5.0–8.0)

## 2018-03-03 MED ORDER — ROSUVASTATIN CALCIUM 20 MG PO TABS: 20.0000 mg | ORAL_TABLET | Freq: Every day | ORAL | 3 refills | Status: DC

## 2018-03-03 NOTE — Patient Instructions (Signed)
Bring the stool sample to us See dentist Schedule with Dr. Tonette BihariKoval Sent in new cholesterol medication  Follow up with me after you see Dr. Raymondo BandKoval  Be well, Dr. Pollie MeyerMcIntyre

## 2018-03-03 NOTE — Progress Notes (Signed)
Date of Visit: 03/03/2018   HPI:  Patient presents for routine follow-up.  Diabetes: Taking insulin N 45 units in the morning and 32-33 units at night.  Has not had any sugars below 100.  Her fastings tend to run in the 200s.  Queasy stomach: Woke up with this this morning.  Has been spitting up some bright red saliva over the last several weeks.  Denies having blood visible in saliva.  Not coughing or gagging, just spitting it up from her mouth.  She has not seen a dentist recently.  Spots on skin -has some spots on her skin that cannot a while back, and has now healed over.  No new lesions.  Wanted me to take a look at them.  Hyperlipidemia: Not taking atorvastatin, prefers not to take this medication as she is worried it was recently recalled.  She is willing to try different statin.  Request for urinalysis: Last time I saw the patient we treated her for yeast.  She wants her urine tested to make sure she does not have a urinary tract infection.  She is not able to stay long enough today to discuss this issue more, as she needs to get to work.  ROS: See HPI.  PMFSH: History of hypertension, hyperlipidemia, diabetes, prior CVA  PHYSICAL EXAM: BP 136/72   Pulse 77   Temp 98.3 F (36.8 C) (Oral)   Ht 5\' 7"  (1.702 m)   Wt 154 lb 9.6 oz (70.1 kg)   SpO2 98%   BMI 24.21 kg/m  Gen: no acute distress, pleasant, cooperative HEENT: normocephalic, atraumatic, moist mucous membranes  Heart: regular rate and rhythm no murmur Lungs: clear to auscultation bilaterally normal work of breathing  Neuro: alert, grossly nonfocal, speech normal Ext: No appreciable lower extremity edema bilaterally   ASSESSMENT/PLAN:  Health maintenance:  -Given instructions for completing stool cards -Patient declines Pneumovax today  DM (diabetes mellitus), type 2, uncontrolled (HCC) Uncontrolled.  Patient is hesitant to change her insulin regimen.  I will have her follow-up with Dr. Raymondo BandKoval where they have  more time to specifically address diabetes alone.  Hyperlipidemia Switch to Crestor 20 mg daily.  HYPERTENSION, BENIGN SYSTEMIC At goal, continue current regimen.  UA unremarkable.  Follow-up at patient's convenience to discuss urinary symptoms if they persist.  We did not get to fully evaluate this today as patient needed to leave the office.  Red spitting: Encouraged her to see a dentist. Could not fully evaluate due to patient's time constraints.  FOLLOW UP: Follow up with Dr. Raymondo BandKoval for diabetes See me after that visit  [place]adaBrittany J. Pollie MeyerMcIntyre, MD [facility]

## 2018-03-09 ENCOUNTER — Ambulatory Visit: Payer: Federal, State, Local not specified - PPO | Admitting: Pharmacist

## 2018-03-09 NOTE — Assessment & Plan Note (Signed)
Uncontrolled.  Patient is hesitant to change her insulin regimen.  I will have her follow-up with Dr. Raymondo BandKoval where they have more time to specifically address diabetes alone.

## 2018-03-09 NOTE — Assessment & Plan Note (Signed)
Switch to Crestor 20 mg daily.

## 2018-03-09 NOTE — Assessment & Plan Note (Signed)
At goal, continue current regimen 

## 2018-03-16 ENCOUNTER — Encounter: Payer: Self-pay | Admitting: Pharmacist

## 2018-03-16 ENCOUNTER — Other Ambulatory Visit: Payer: Federal, State, Local not specified - PPO

## 2018-03-16 ENCOUNTER — Ambulatory Visit (INDEPENDENT_AMBULATORY_CARE_PROVIDER_SITE_OTHER): Payer: Federal, State, Local not specified - PPO | Admitting: Pharmacist

## 2018-03-16 DIAGNOSIS — E1165 Type 2 diabetes mellitus with hyperglycemia: Secondary | ICD-10-CM

## 2018-03-16 DIAGNOSIS — E11649 Type 2 diabetes mellitus with hypoglycemia without coma: Secondary | ICD-10-CM

## 2018-03-16 NOTE — Progress Notes (Signed)
Patient ID: Destiny ShepherdJannie L Mittal, female   DOB: 06/10/1956, 62 y.o.   MRN: [id number] Reviewed: Agree with Dr. Macky LowerKoval's documentation and management.

## 2018-03-16 NOTE — Patient Instructions (Signed)
Thank you for coming in today, Information for Ozempic was provided, and would be used in addition to your insulin.  Please call back in 1-2 weeks to let us know if you would like a prescription for this new medication. Next Rx visit later in April

## 2018-03-16 NOTE — Progress Notes (Addendum)
   Subjective:    Patient ID: Destiny ShepherdJannie L Dowis, female    DOB: 06/24/1956, 62 y.o.   MRN: [id number]

## 2018-03-16 NOTE — Assessment & Plan Note (Signed)
Diabetes longstanding diagnosed currently not at A1c goal with none recorded at goal since 2008.  Patient denies hypoglycemic events and is able to verbalize appropriate hypoglycemia management plan. Patient reports adherence with medication with the exception of using slightly less insulin than instructed at times d/t cost. Control is suboptimal on current regimen of Humulin N alone, and patient hesitant to start any new medications but willing to read information on Ozempic at this time.    Continue Humulin N 45 units in the AM, and 35 units in the evening. Consider starting Ozempic in addition to insulin, for which information was provided and decision timeframe of 1-2 weeks established.

## 2018-03-16 NOTE — Progress Notes (Signed)
    S:     Chief Complaint  Patient presents with  . Medication Management    DM    Patient arrives ambulating on her own and in a pleasant mood.  Presents for diabetes evaluation, education, and management at the request of Dr. Pollie MeyerMcIntyre and was referred on 03/09/2018.  Patient was last seen by Primary Care Provider on 03/09/2018.   Patient reports Diabetes was diagnosed approximately 33 years ago.   Patient reports adherence with medications.  Current diabetes medications include: Humulin N 45 units in the AM, and 35 units in the evening  Current hypertension medications include: Lopressor 50mg  once daily and amplodipine 10mg  once daily  Patient denies hypoglycemic events.  Patient reported dietary habits: Eats 3 meals/day Breakfast:Cherrios, oatmeal Lunch:hamburger, fish sandwich Dinner:Sweet potatoes, fried chicken Drinks:Tea (50/50), diet sodas   O:  Physical Exam  Constitutional: She appears well-developed and well-nourished.  Vitals reviewed.    Review of Systems  All other systems reviewed and are negative.    Lab Results  Component Value Date   HGBA1C 11.8 03/03/2018   Vitals:   03/16/18 0952  BP: 128/82  Pulse: 82  SpO2: 99%    A/P: Diabetes longstanding diagnosed currently not at A1c goal with none recorded at goal since 2008.  Patient denies hypoglycemic events and is able to verbalize appropriate hypoglycemia management plan. Patient reports adherence with medication with the exception of using slightly less insulin than instructed at times d/t cost. Control is suboptimal on current regimen of Humulin N alone, and patient hesitant to start any new medications but willing to read information on Ozempic at this time.    Continue Humulin N 45 units in the AM, and 35 units in the evening. Consider starting Ozempic in addition to insulin, for which information was provided and decision timeframe of 1-2 weeks established.    Next A1C anticipated June 2019.      Written patient instructions provided.  Total time in face to face counseling 20 minutes.   Follow up in Pharmacist Clinic Visit later in April.   Patient seen with Rodolph Bonghris Wang, PharmD Candidate and Daylene PoseyJonathan Oriet, PharmD, PGY1 Resident.

## 2018-03-17 LAB — CMP14+EGFR
ALT: 18 IU/L (ref 0–32)
AST: 11 IU/L (ref 0–40)
Albumin/Globulin Ratio: 1.3 (ref 1.2–2.2)
Albumin: 3.9 g/dL (ref 3.6–4.8)
Alkaline Phosphatase: 117 IU/L (ref 39–117)
BUN/Creatinine Ratio: 13 (ref 12–28)
BUN: 11 mg/dL (ref 8–27)
Bilirubin Total: 0.4 mg/dL (ref 0.0–1.2)
CO2: 25 mmol/L (ref 20–29)
Calcium: 9.3 mg/dL (ref 8.7–10.3)
Chloride: 103 mmol/L (ref 96–106)
Creatinine, Ser: 0.82 mg/dL (ref 0.57–1.00)
GFR calc Af Amer: 88 mL/min/{1.73_m2} (ref >59)
GFR calc non Af Amer: 76 mL/min/{1.73_m2} (ref >59)
Globulin, Total: 3.1 g/dL (ref 1.5–4.5)
Glucose: 279 mg/dL — ABNORMAL HIGH (ref 65–99)
Potassium: 4.1 mmol/L (ref 3.5–5.2)
Sodium: 140 mmol/L (ref 134–144)
Total Protein: 7 g/dL (ref 6.0–8.5)

## 2018-03-17 LAB — LIPID PANEL
Chol/HDL Ratio: 4.9 ratio — ABNORMAL HIGH (ref 0.0–4.4)
Cholesterol, Total: 176 mg/dL (ref 100–199)
HDL: 36 mg/dL — ABNORMAL LOW (ref >39)
LDL Calculated: 113 mg/dL — ABNORMAL HIGH (ref 0–99)
Triglycerides: 133 mg/dL (ref 0–149)
VLDL Cholesterol Cal: 27 mg/dL (ref 5–40)

## 2018-03-27 ENCOUNTER — Encounter: Payer: Self-pay | Admitting: Family Medicine

## 2018-04-10 ENCOUNTER — Other Ambulatory Visit: Payer: Self-pay | Admitting: Family Medicine

## 2018-05-30 ENCOUNTER — Other Ambulatory Visit: Payer: Self-pay | Admitting: Family Medicine

## 2018-06-01 NOTE — Telephone Encounter (Signed)
Please confirm what dose of insulin patient is currently taking. Also remind her to schedule follow up with Dr. Raymondo BandKoval for diabetes.  Thanks Latrelle DodrillBrittany J Emma Birchler, MD

## 2018-06-02 NOTE — Telephone Encounter (Signed)
Tried calling patient to confirm dose of insulin per Dr. Pollie MeyerMcIntyre. There was no answer and no voice mail. Will try and call later.  Glennie Hawk.Simpson, Michelle R, CMA

## 2018-06-02 NOTE — Telephone Encounter (Signed)
Attempted to call patient and verify her insulin dose. There was no answer.  If patient calls concerning her insulin please get the dosage.  Will try calling again.  Glennie Hawk.Simpson, Michelle R, CMA

## 2018-06-06 ENCOUNTER — Other Ambulatory Visit: Payer: Self-pay | Admitting: Family Medicine

## 2018-07-13 ENCOUNTER — Other Ambulatory Visit: Payer: Self-pay | Admitting: Family Medicine

## 2018-07-19 ENCOUNTER — Other Ambulatory Visit: Payer: Self-pay | Admitting: Family Medicine

## 2018-07-20 NOTE — Telephone Encounter (Signed)
Patient needs follow up appointment - please have her schedule one with any provider since I will be out on leave. Thanks, Latrelle DodrillBrittany J Juneau Doughman, MD

## 2018-07-21 NOTE — Telephone Encounter (Signed)
LMOVM for pt to call back to schedule follow up with any provider. Jason Frisbee Bruna PotterBlount, CMA

## 2018-09-18 ENCOUNTER — Other Ambulatory Visit: Payer: Self-pay | Admitting: Family Medicine

## 2018-09-22 ENCOUNTER — Other Ambulatory Visit: Payer: Self-pay | Admitting: Family Medicine

## 2018-10-18 ENCOUNTER — Other Ambulatory Visit: Payer: Self-pay | Admitting: Family Medicine

## 2018-10-19 NOTE — Telephone Encounter (Signed)
Please ask patient to schedule a follow up visit with me.  Thanks! Malyia Moro J Nerissa Constantin, MD  

## 2018-10-19 NOTE — Telephone Encounter (Signed)
Attempted to call patient and schedule an appointment with Dr. Pollie Meyer. There was no answer or voicemail.  Will attempt to call again.  Glennie Hawk, CMA

## 2018-10-20 NOTE — Telephone Encounter (Signed)
Attempted to call patient to schedule a follow up with Dr. Pollie Meyer. Still no answer or voice mail.  Destiny Morton, CMA

## 2018-10-22 ENCOUNTER — Telehealth: Payer: Self-pay

## 2018-10-22 NOTE — Telephone Encounter (Signed)
Attempted to call patient once again and no answer or voicemail available.  Destiny Morton.Nolberto Cheuvront R, CMA

## 2018-10-22 NOTE — Telephone Encounter (Signed)
Called patient on her cell phone and was able to leave a message to call office to schedule an appointment with Dr. Pollie MeyerMcIntyre.  Glennie Hawk.,  R, CMA

## 2018-12-05 ENCOUNTER — Other Ambulatory Visit: Payer: Self-pay | Admitting: Family Medicine

## 2018-12-07 ENCOUNTER — Telehealth: Payer: Self-pay

## 2018-12-07 NOTE — Telephone Encounter (Signed)
Please have patient schedule follow up appointment with me, thanks Latrelle DodrillBrittany J Calvin Jablonowski, MD

## 2018-12-07 NOTE — Telephone Encounter (Signed)
Attempted to call patient to schedule and appointment. No answer and no voice mail.  Will try again later.  Glennie Hawk.Kiosha Buchan R, CMA

## 2018-12-10 NOTE — Telephone Encounter (Signed)
Attempted to call pt on both numbers, no answer or voicemail set up. Will try again later. Destiny Morton Bruna Potter, CMA

## 2018-12-11 ENCOUNTER — Telehealth: Payer: Self-pay

## 2018-12-11 NOTE — Telephone Encounter (Signed)
Called and left voice mail for patient to call back and schedule a follow up appointment with Dr. Pollie MeyerMcIntyre.  Glennie Hawk.Simpson, Michelle R, CMA

## 2018-12-28 ENCOUNTER — Ambulatory Visit: Payer: Federal, State, Local not specified - PPO | Admitting: Family Medicine

## 2018-12-28 LAB — HM DIABETES EYE EXAM

## 2019-01-01 ENCOUNTER — Other Ambulatory Visit: Payer: Self-pay | Admitting: Family Medicine

## 2019-02-15 ENCOUNTER — Other Ambulatory Visit: Payer: Self-pay | Admitting: Family Medicine

## 2019-02-18 ENCOUNTER — Encounter: Payer: Self-pay | Admitting: Family Medicine

## 2019-02-23 ENCOUNTER — Other Ambulatory Visit: Payer: Self-pay | Admitting: Family Medicine

## 2019-03-04 ENCOUNTER — Other Ambulatory Visit: Payer: Self-pay | Admitting: Family Medicine

## 2019-03-12 ENCOUNTER — Other Ambulatory Visit: Payer: Self-pay | Admitting: Family Medicine

## 2019-03-20 ENCOUNTER — Other Ambulatory Visit: Payer: Self-pay | Admitting: Family Medicine

## 2019-03-21 ENCOUNTER — Other Ambulatory Visit: Payer: Self-pay | Admitting: Family Medicine

## 2019-03-24 ENCOUNTER — Telehealth: Payer: Self-pay | Admitting: Family Medicine

## 2019-03-24 NOTE — Telephone Encounter (Signed)
Called patient to discuss refills. She is well past due for a follow up visit. Want to offer telemedicine visit to her.  She was not available but I left a HIPAA compliant message with a female who answered, asking her to call back.  [name], MD

## 2019-04-03 ENCOUNTER — Other Ambulatory Visit: Payer: Self-pay | Admitting: Family Medicine

## 2019-04-17 ENCOUNTER — Other Ambulatory Visit: Payer: Self-pay | Admitting: Family Medicine

## 2019-04-20 ENCOUNTER — Other Ambulatory Visit: Payer: Self-pay | Admitting: Family Medicine

## 2019-04-21 ENCOUNTER — Other Ambulatory Visit: Payer: Self-pay | Admitting: Family Medicine

## 2019-04-22 ENCOUNTER — Other Ambulatory Visit: Payer: Self-pay | Admitting: *Deleted

## 2019-04-22 NOTE — Telephone Encounter (Signed)
Pt calls to inquire why her meds were not called in. Advised that she needed an appt.  Pt states " well I cant come right now and she will just have to understand that".  When I asked her to elaborate she declined.  She was agreeable to virtual visit (bytelephone only).  Appt made for Monday, but advised that I would send message to PCP to see if that was acceptable.  Pt agreeable. Jone Baseman, CMA

## 2019-04-22 NOTE — Telephone Encounter (Signed)
It is completely fine for her to do a virtual visit. I have been trying for months to reach her to at least discuss her medications on the phone but she has never called back or scheduled an appointment. She hasn't been seen here in over a year, and it is not safe for me to keep prescribing her medications without at least speaking with her.   Please let patient know virtual visit is acceptable and I understand her not wanting to come in to the office right now with COVID. I will speak with her on Monday.  Thanks,  [name], MD

## 2019-04-23 NOTE — Telephone Encounter (Signed)
Attempted to call patient concerning virtual appointment 04/26/2019 and there was no answer and no voice mail.  Glennie Hawk, CMA'

## 2019-04-26 ENCOUNTER — Other Ambulatory Visit: Payer: Self-pay

## 2019-04-26 ENCOUNTER — Telehealth (INDEPENDENT_AMBULATORY_CARE_PROVIDER_SITE_OTHER): Payer: Federal, State, Local not specified - PPO | Admitting: Family Medicine

## 2019-04-26 DIAGNOSIS — I10 Essential (primary) hypertension: Secondary | ICD-10-CM | POA: Diagnosis not present

## 2019-04-26 DIAGNOSIS — E785 Hyperlipidemia, unspecified: Secondary | ICD-10-CM

## 2019-04-26 DIAGNOSIS — E11649 Type 2 diabetes mellitus with hypoglycemia without coma: Secondary | ICD-10-CM

## 2019-04-26 MED ORDER — AMLODIPINE BESYLATE 10 MG PO TABS: 10.0000 mg | ORAL_TABLET | Freq: Every day | ORAL | 0 refills | Status: DC

## 2019-04-26 MED ORDER — METOPROLOL TARTRATE 50 MG PO TABS: ORAL_TABLET | ORAL | 0 refills | Status: DC

## 2019-04-26 MED ORDER — INSULIN NPH (HUMAN) (ISOPHANE) 100 UNIT/ML ~~LOC~~ SUSP: SUBCUTANEOUS | 2 refills | Status: DC

## 2019-04-26 NOTE — Assessment & Plan Note (Signed)
Again stressed to patient the importance of regular follow up, even if it is just by telephone. She is hesitant to make changes to her insulin but did agree to go up slightly by 1 unit per day of her morning dose of insulin, up to a max of 50 units in the AM.  Advised if I do not at least speak with her on the phone (better yet, in office) every 3 months that I will not refill her medications. I cannot safely prescribe them without being able to reach her. After a bit of back and forth, she seemed to understand this.   She will follow up in a week or so for labs. Will check A1c, CMET, lipids. Will call patient when results return to see how her sugars are doing. Patient agreeable.

## 2019-04-26 NOTE — Progress Notes (Signed)
[facility] Telemedicine Visit  Patient consented to have virtual visit. Method of visit: Telephone  Encounter participants: Patient: ALEIGH WITKOWSKI - located at home Provider: Levert Feinstein - located at office Others (if applicable): n/a  Chief Complaint: medication refill  HPI:  Patient seen via virtual visit for medication refill.  Diabetes - taking humulin N 45 units in AM and 30-35 u at night, depending on how her sugars are doing. Fasting sugars are in the 190s. She is well past due for an A1c and has not been seen in our office in over 1 year. She expresses her concern that I did not refill her medications (instead made her schedule appointment), states it is putting her in danger. We discussed the importance of regular follow up in light of her uncontrolled diabetes.  Hypertension - currently taking metoprolol tartrate 50mg  just once daily in the morning (prescribed as twice daily) also taking amlodipine 10mg  daily. Checks blood pressure at home and typically gets 120s/70s. Pulse running in 60s during the day. Feels well.  ROS: per HPI  Pertinent PMHx: history of type 2 diabetes, hyperlipidemia, hypertension, CVA  Exam:  Respiratory: Patient speaking normally in full sentences throughout the encounter, without any respiratory distress evident.   Assessment/Plan:  DM (diabetes mellitus), type 2, uncontrolled (HCC) Again stressed to patient the importance of regular follow up, even if it is just by telephone. She is hesitant to make changes to her insulin but did agree to go up slightly by 1 unit per day of her morning dose of insulin, up to a max of 50 units in the AM.  Advised if I do not at least speak with her on the phone (better yet, in office) every 3 months that I will not refill her medications. I cannot safely prescribe them without being able to reach her. After a bit of back and forth, she seemed to understand this.   She will follow  up in a week or so for labs. Will check A1c, CMET, lipids. Will call patient when results return to see how her sugars are doing. Patient agreeable.  HYPERTENSION, BENIGN SYSTEMIC Controlled by home readings. Continue current medications. Return for fasting lab appointment.   Hyperlipidemia Not taking crestor. Check lipids and revisit idea of statin with patient.    Time spent during visit with patient: 33 minutes

## 2019-04-26 NOTE — Assessment & Plan Note (Signed)
Not taking crestor. Check lipids and revisit idea of statin with patient.

## 2019-04-26 NOTE — Assessment & Plan Note (Signed)
Controlled by home readings. Continue current medications. Return for fasting lab appointment.

## 2019-05-19 ENCOUNTER — Other Ambulatory Visit: Payer: Self-pay | Admitting: Family Medicine

## 2019-05-20 ENCOUNTER — Other Ambulatory Visit: Payer: Self-pay | Admitting: Family Medicine

## 2019-05-20 DIAGNOSIS — E11649 Type 2 diabetes mellitus with hypoglycemia without coma: Secondary | ICD-10-CM

## 2019-05-20 NOTE — Telephone Encounter (Signed)
Attempted to call pt no voicemail set up. Deseree Kennon Holter, CMA

## 2019-05-20 NOTE — Telephone Encounter (Signed)
Please remind patient to schedule a fasting lab appointment - she is past due for labs Leeanne Rio, MD

## 2019-05-20 NOTE — Telephone Encounter (Signed)
Attempted to call patient to schedule a lab visit.  No answer and no voice mail.  Phone rings once and has a busy sign.  Destiny Morton, [place]

## 2019-05-24 NOTE — Telephone Encounter (Signed)
Pt returned call.  She was informed but will need to call back after she checks her schedule. Christen Bame, CMA

## 2019-05-24 NOTE — Telephone Encounter (Signed)
LMOVM for pt to call back and make appt for  Fasting labs. Waldine Zenz Kennon Holter, CMA

## 2019-06-23 ENCOUNTER — Other Ambulatory Visit: Payer: Federal, State, Local not specified - PPO

## 2019-07-22 ENCOUNTER — Other Ambulatory Visit: Payer: Self-pay

## 2019-07-22 MED ORDER — ACCU-CHEK GUIDE VI STRP: ORAL_STRIP | 12 refills | Status: DC

## 2019-07-22 NOTE — Telephone Encounter (Signed)
Patient calls nurse line stating she got a new meter that requires accu chek guide test strips.

## 2019-07-22 NOTE — Telephone Encounter (Signed)
Will send in. Red team, please ask patient to schedule a visit with me. She is also past due on labs as she canceled her lab visit in July. Leeanne Rio, MD

## 2019-07-22 NOTE — Telephone Encounter (Signed)
Pt informed and scheduled her for an appt. Destiny Morton Kennon Holter, CMA

## 2019-07-24 ENCOUNTER — Other Ambulatory Visit: Payer: Self-pay | Admitting: Family Medicine

## 2019-08-08 ENCOUNTER — Other Ambulatory Visit: Payer: Self-pay | Admitting: Family Medicine

## 2019-08-09 ENCOUNTER — Other Ambulatory Visit: Payer: Self-pay | Admitting: Family Medicine

## 2019-08-12 ENCOUNTER — Ambulatory Visit: Payer: Federal, State, Local not specified - PPO | Admitting: Family Medicine

## 2019-08-13 ENCOUNTER — Other Ambulatory Visit: Payer: Self-pay | Admitting: Family Medicine

## 2019-08-28 ENCOUNTER — Other Ambulatory Visit: Payer: Self-pay | Admitting: Family Medicine

## 2019-08-30 NOTE — Telephone Encounter (Signed)
Attempted to call patient to schedule an appointment.  There was no answer and a busy dial tone starts after the first ring.  Destiny Morton, [name]

## 2019-08-30 NOTE — Telephone Encounter (Signed)
Please ask patient to schedule appointment Thanks Leeanne Rio, MD

## 2019-09-01 ENCOUNTER — Telehealth: Payer: Self-pay

## 2019-09-01 NOTE — Telephone Encounter (Signed)
Called patient on secondary number [phone] (M) and left message for her to call office to make appointment.  [name], [place]

## 2019-09-28 ENCOUNTER — Other Ambulatory Visit: Payer: Self-pay | Admitting: Family Medicine

## 2019-10-11 ENCOUNTER — Other Ambulatory Visit: Payer: Self-pay | Admitting: Family Medicine

## 2019-10-26 ENCOUNTER — Other Ambulatory Visit: Payer: Self-pay | Admitting: Family Medicine

## 2019-10-27 NOTE — Telephone Encounter (Signed)
Please ask patient to schedule appointment. Virtual is better than nothing.  Thanks Leeanne Rio, MD

## 2019-10-28 NOTE — Telephone Encounter (Signed)
Attempted to call patient to schedule an  Appointment.  No answer and no ability to leave a message.  Destiny Morton, [place]

## 2019-10-29 NOTE — Telephone Encounter (Signed)
Attempted to call patient again and there was no answer.  There is a continuous dial tone.  [name], [name]

## 2019-11-03 NOTE — Telephone Encounter (Signed)
Finally called patient on cell phone [phone] and was able to leave a message to call office for appointment.  Destiny Morton, [place]

## 2019-11-09 ENCOUNTER — Encounter: Payer: Self-pay | Admitting: Family Medicine

## 2019-11-15 ENCOUNTER — Other Ambulatory Visit: Payer: Self-pay | Admitting: Family Medicine

## 2019-11-17 NOTE — Telephone Encounter (Signed)
Has upcoming virtual visit scheduled on 12/17, will refill Leeanne Rio, MD

## 2019-11-25 ENCOUNTER — Other Ambulatory Visit: Payer: Self-pay

## 2019-11-25 ENCOUNTER — Telehealth (INDEPENDENT_AMBULATORY_CARE_PROVIDER_SITE_OTHER): Payer: Medicare Other | Admitting: Family Medicine

## 2019-11-25 DIAGNOSIS — E11649 Type 2 diabetes mellitus with hypoglycemia without coma: Secondary | ICD-10-CM

## 2019-11-25 DIAGNOSIS — Z8673 Personal history of transient ischemic attack (TIA), and cerebral infarction without residual deficits: Secondary | ICD-10-CM | POA: Diagnosis not present

## 2019-11-25 DIAGNOSIS — Z794 Long term (current) use of insulin: Secondary | ICD-10-CM

## 2019-11-25 DIAGNOSIS — E118 Type 2 diabetes mellitus with unspecified complications: Secondary | ICD-10-CM | POA: Diagnosis not present

## 2019-11-25 DIAGNOSIS — I10 Essential (primary) hypertension: Secondary | ICD-10-CM

## 2019-11-25 DIAGNOSIS — E785 Hyperlipidemia, unspecified: Secondary | ICD-10-CM

## 2019-11-25 NOTE — Progress Notes (Signed)
[place] Telemedicine Visit  Patient consented to have virtual visit. Method of visit: Telephone - declined video  Encounter participants: Patient: Destiny Morton - located at home Provider: Chrisandra Netters - located at office Others (if applicable): n/a  Chief Complaint: follow up   HPI:  Patient presents for follow up via telephone visit.   Diabetes - currently taking NPH 48 units in the morning and 36-37 at night. Morning fasting sugars are running in the 170s. Lowest she's had in the morning was around 90, otherwise mostly are in the high 100s. Sugars are in the 180s before bedtime. Notes sugars are better controlled when she doesn't eat in the evenings. Last saw ophthalmologist in November [facility])  Hypertension - taking metoprolol 50mg  once per day. Blood pressure in AM running 120s/80s.  Hyperlipidemia - not taking a statin presently. Would be willing to take simvastatin, does not want to take lipitor or crestor. Due for lipids.  ROS: per HPI  Pertinent PMHx: history of type 2 diabetes, hyperlipidemia, hypertension, prior CVA  Exam:  Respiratory: Patient speaking normally in full sentences throughout the encounter, without any respiratory distress evident.   Assessment/Plan:  Hyperlipidemia Agreeable with takin gsimvastatin. rx sent in for simvastatin 20mg  daily. Check lipids at upcoming fasting appointment next month.  DM (diabetes mellitus), type 2, uncontrolled ([place]) Sugars sound somewhat uncontrolled based on AM fasting values. Will have her come for visit next month to check A1c, and likely further increase insulin if patient is amenable  HYPERTENSION, BENIGN SYSTEMIC At goal based on home values. Check blood pressure when she comes in next month for in-office visit.   Health maintenance: -Due for flu & pneumonia vaccines, patient unsure if she wants to get these. She will think about it.  -Opts to get one more pap  smear, and if normal will be done with paps. -Will request eye records -Advised patient she is due for mammogram & DEXA scan as well  Time spent during visit with patient: 23 minutes

## 2019-11-26 MED ORDER — SIMVASTATIN 20 MG PO TABS: 20.0000 mg | ORAL_TABLET | Freq: Every day | ORAL | 3 refills | Status: DC

## 2019-11-26 NOTE — Assessment & Plan Note (Signed)
At goal based on home values. Check blood pressure when she comes in next month for in-office visit.

## 2019-11-26 NOTE — Assessment & Plan Note (Signed)
Agreeable with takin gsimvastatin. rx sent in for simvastatin 20mg  daily. Check lipids at upcoming fasting appointment next month.

## 2019-11-26 NOTE — Assessment & Plan Note (Signed)
Sugars sound somewhat uncontrolled based on AM fasting values. Will have her come for visit next month to check A1c, and likely further increase insulin if patient is amenable

## 2019-12-01 ENCOUNTER — Telehealth: Payer: Self-pay | Admitting: *Deleted

## 2019-12-01 NOTE — Telephone Encounter (Signed)
Rx request for rosuvastatin calcium 20mg  tab. Dason Mosley Kennon Holter, CMA

## 2019-12-01 NOTE — Telephone Encounter (Signed)
Will not refill. Patient prefers simvastatin Destiny Rio, MD

## 2019-12-05 ENCOUNTER — Other Ambulatory Visit: Payer: Self-pay | Admitting: Family Medicine

## 2019-12-06 ENCOUNTER — Telehealth: Payer: Self-pay | Admitting: *Deleted

## 2019-12-06 NOTE — Telephone Encounter (Signed)
Patient will not take rosuvastatin. Will not refill Leeanne Rio, MD

## 2019-12-06 NOTE — Telephone Encounter (Signed)
Rx request rosuvastatin 20mg  tab. Gianny Sabino Kennon Holter, CMA

## 2019-12-13 ENCOUNTER — Other Ambulatory Visit: Payer: Federal, State, Local not specified - PPO

## 2019-12-16 ENCOUNTER — Ambulatory Visit: Payer: Federal, State, Local not specified - PPO | Admitting: Family Medicine

## 2020-01-30 ENCOUNTER — Other Ambulatory Visit: Payer: Self-pay | Admitting: Family Medicine

## 2020-01-31 ENCOUNTER — Telehealth: Payer: Self-pay

## 2020-01-31 NOTE — Telephone Encounter (Signed)
Called patient and LVM for her to call and schedule an appointment per Dr.McIntyre.  Glennie Hawk, CMA

## 2020-01-31 NOTE — Telephone Encounter (Signed)
Please ask patient to follow up with me. She no showed to her visit last month   Thanks [name], MD

## 2020-01-31 NOTE — Telephone Encounter (Signed)
Opened in error.  Documented elsewhere.  Glennie Hawk, CMA

## 2020-03-02 ENCOUNTER — Other Ambulatory Visit: Payer: Self-pay | Admitting: Family Medicine

## 2020-03-08 ENCOUNTER — Other Ambulatory Visit: Payer: Federal, State, Local not specified - PPO

## 2020-03-16 ENCOUNTER — Other Ambulatory Visit (INDEPENDENT_AMBULATORY_CARE_PROVIDER_SITE_OTHER): Payer: Medicare HMO

## 2020-03-16 ENCOUNTER — Other Ambulatory Visit: Payer: Self-pay

## 2020-03-16 DIAGNOSIS — E11649 Type 2 diabetes mellitus with hypoglycemia without coma: Secondary | ICD-10-CM | POA: Diagnosis not present

## 2020-03-16 LAB — POCT GLYCOSYLATED HEMOGLOBIN (HGB A1C): HbA1c, POC (controlled diabetic range): 12.1 % — AB (ref 0.0–7.0)

## 2020-03-17 LAB — LIPID PANEL
Chol/HDL Ratio: 5.3 ratio — ABNORMAL HIGH (ref 0.0–4.4)
Cholesterol, Total: 185 mg/dL (ref 100–199)
HDL: 35 mg/dL — ABNORMAL LOW (ref >39)
LDL Chol Calc (NIH): 132 mg/dL — ABNORMAL HIGH (ref 0–99)
Triglycerides: 98 mg/dL (ref 0–149)
VLDL Cholesterol Cal: 18 mg/dL (ref 5–40)

## 2020-03-17 LAB — CMP14+EGFR
ALT: 21 IU/L (ref 0–32)
AST: 15 IU/L (ref 0–40)
Albumin/Globulin Ratio: 1.2 (ref 1.2–2.2)
Albumin: 4 g/dL (ref 3.8–4.8)
Alkaline Phosphatase: 126 IU/L — ABNORMAL HIGH (ref 39–117)
BUN/Creatinine Ratio: 16 (ref 12–28)
BUN: 14 mg/dL (ref 8–27)
Bilirubin Total: 0.3 mg/dL (ref 0.0–1.2)
CO2: 23 mmol/L (ref 20–29)
Calcium: 9.3 mg/dL (ref 8.7–10.3)
Chloride: 100 mmol/L (ref 96–106)
Creatinine, Ser: 0.87 mg/dL (ref 0.57–1.00)
GFR calc Af Amer: 81 mL/min/{1.73_m2} (ref >59)
GFR calc non Af Amer: 70 mL/min/{1.73_m2} (ref >59)
Globulin, Total: 3.3 g/dL (ref 1.5–4.5)
Glucose: 327 mg/dL — ABNORMAL HIGH (ref 65–99)
Potassium: 4 mmol/L (ref 3.5–5.2)
Sodium: 137 mmol/L (ref 134–144)
Total Protein: 7.3 g/dL (ref 6.0–8.5)

## 2020-04-02 ENCOUNTER — Other Ambulatory Visit: Payer: Self-pay | Admitting: Family Medicine

## 2020-04-06 ENCOUNTER — Other Ambulatory Visit: Payer: Self-pay | Admitting: Family Medicine

## 2020-04-07 NOTE — Telephone Encounter (Signed)
Will refill but please ask patient to schedule a follow up visit with me in person to discuss her diabetes Her last A1c was quite elevated Thanks [name], MD

## 2020-04-10 NOTE — Telephone Encounter (Signed)
LVM for patient to call office to schedule follow up appointment for Diabetes.  Glennie Hawk, CMA

## 2020-05-01 ENCOUNTER — Other Ambulatory Visit: Payer: Self-pay | Admitting: Family Medicine

## 2020-05-02 ENCOUNTER — Telehealth: Payer: Self-pay | Admitting: Family Medicine

## 2020-05-02 NOTE — Telephone Encounter (Signed)
Pt is requesting a call back regarding the results of her lab work. Pls call [phone]

## 2020-05-04 ENCOUNTER — Other Ambulatory Visit: Payer: Self-pay | Admitting: Family Medicine

## 2020-05-05 NOTE — Telephone Encounter (Signed)
Returned call to patient. Female answered and said patient was busy and could not answer. Left HIPAA compliant message with him saying her doctor's office was calling her back.. He said he would let her know  [name], MD

## 2020-05-18 ENCOUNTER — Telehealth: Payer: Self-pay

## 2020-05-18 NOTE — Telephone Encounter (Signed)
Patient calls nurse line for two reasons. (1) Patient would like her recent 03/16/2020 labs mailed to her home. I confirmed address and will place them in the mail for her. (2) Patient is wondering if PCP can call in an OTC medication that helps with her nerve pain? Patient states the "vitamin" is called Victrix. Patient reports even though she buys OTC, Collie Siad is still ~45 a bottle. Patient would like to see if any cheaper through a prescription. Please advise.

## 2020-05-19 ENCOUNTER — Encounter: Payer: Self-pay | Admitting: Family Medicine

## 2020-05-19 NOTE — Telephone Encounter (Signed)
See separate phone note.

## 2020-05-19 NOTE — Telephone Encounter (Signed)
Also, if patient calls back and asks, I will not rx this vitamin.  I need to see patient and evaluate her for nerve pain. Suspect it is related to her uncontrolled diabetes, which we need to work on, but it has been very challenging to get her to follow up.   [name], MD

## 2020-05-19 NOTE — Telephone Encounter (Signed)
Have been trying to reach patient repeatedly without success. Called patient again twice on each number, no answer. Left voicemail asking her to call back. I want to speak with her about her labs and overall care. A1c is elevated, cholesterol is elevated, and she is behind on many health maintenance items - stated this on the phone.  I need her to come in to the clinic to see me for a visit or at least speak with me on the phone.  Will send letter stating this as well.  [name], MD

## 2020-05-27 ENCOUNTER — Other Ambulatory Visit: Payer: Self-pay | Admitting: Family Medicine

## 2020-06-07 ENCOUNTER — Telehealth: Payer: Self-pay

## 2020-06-07 ENCOUNTER — Other Ambulatory Visit: Payer: Self-pay | Admitting: Family Medicine

## 2020-06-07 NOTE — Telephone Encounter (Signed)
Please advise patient this is the last time I will prescribe her medication without her scheduling an appointment to see me. I have not seen her in person in over 2 years. I have attempted to call her and even sent her a letter. Please try calling her one last time. I will refuse any further medication refills until she schedules an appointment.   Thanks [name], MD

## 2020-06-07 NOTE — Telephone Encounter (Signed)
Called patient and LVM asking her to make an appointment with Dr. Pollie Meyer for further refills.  Glennie Hawk, CMA

## 2020-06-07 NOTE — Telephone Encounter (Signed)
Patient returns call to nurse line. Scheduled appointment.   Veronda Prude, RN

## 2020-06-29 ENCOUNTER — Other Ambulatory Visit: Payer: Self-pay | Admitting: Family Medicine

## 2020-07-01 ENCOUNTER — Other Ambulatory Visit: Payer: Self-pay | Admitting: Family Medicine

## 2020-07-06 ENCOUNTER — Ambulatory Visit (INDEPENDENT_AMBULATORY_CARE_PROVIDER_SITE_OTHER): Payer: Medicare HMO | Admitting: Family Medicine

## 2020-07-06 ENCOUNTER — Encounter: Payer: Self-pay | Admitting: Family Medicine

## 2020-07-06 ENCOUNTER — Other Ambulatory Visit: Payer: Self-pay

## 2020-07-06 VITALS — BP 142/80 | HR 83 | Ht 67.0 in | Wt 162.4 lb

## 2020-07-06 DIAGNOSIS — R0989 Other specified symptoms and signs involving the circulatory and respiratory systems: Secondary | ICD-10-CM | POA: Diagnosis not present

## 2020-07-06 DIAGNOSIS — E2839 Other primary ovarian failure: Secondary | ICD-10-CM | POA: Diagnosis not present

## 2020-07-06 DIAGNOSIS — E1142 Type 2 diabetes mellitus with diabetic polyneuropathy: Secondary | ICD-10-CM

## 2020-07-06 DIAGNOSIS — I10 Essential (primary) hypertension: Secondary | ICD-10-CM

## 2020-07-06 DIAGNOSIS — Z1231 Encounter for screening mammogram for malignant neoplasm of breast: Secondary | ICD-10-CM

## 2020-07-06 DIAGNOSIS — E11649 Type 2 diabetes mellitus with hypoglycemia without coma: Secondary | ICD-10-CM

## 2020-07-06 DIAGNOSIS — E785 Hyperlipidemia, unspecified: Secondary | ICD-10-CM

## 2020-07-06 LAB — POCT GLYCOSYLATED HEMOGLOBIN (HGB A1C): HbA1c, POC (controlled diabetic range): 11.1 % — AB (ref 0.0–7.0)

## 2020-07-06 MED ORDER — CAPSAICIN 0.075 % EX GEL: CUTANEOUS | 1 refills | Status: DC

## 2020-07-06 MED ORDER — ROSUVASTATIN CALCIUM 20 MG PO TABS: 20.0000 mg | ORAL_TABLET | Freq: Every day | ORAL | 3 refills | Status: DC

## 2020-07-06 MED ORDER — INSULIN NPH (HUMAN) (ISOPHANE) 100 UNIT/ML ~~LOC~~ SUSP: SUBCUTANEOUS | Status: DC

## 2020-07-06 MED ORDER — CLOTRIMAZOLE 1 % EX CREA: 1.0000 "application " | TOPICAL_CREAM | Freq: Two times a day (BID) | CUTANEOUS | 0 refills | Status: DC

## 2020-07-06 NOTE — Patient Instructions (Addendum)
See if you can get the COVID vaccine on Tuesday 8/3 See me in later August, scheduled appointment  Change simvastatin to rosuvastatin 20mg  daily  Sent in clotrimazole for between your toes  Sent in capsaicin for your foot pain  Go up on insulin to 48 units in the morning and 38 units in the evening  Scheduling ABI test for the blood flow to your feet - we will call you with an appointment   Get your mammogram and DEXA scan 2 months after you finish your COVID vaccines.  Be well, Dr. 

## 2020-07-09 LAB — MICROALBUMIN / CREATININE URINE RATIO
Creatinine, Urine: 101.6 mg/dL
Microalb/Creat Ratio: 12 mg/g creat (ref 0–29)
Microalbumin, Urine: 12.4 ug/mL

## 2020-07-10 ENCOUNTER — Encounter: Payer: Self-pay | Admitting: Family Medicine

## 2020-07-10 DIAGNOSIS — E1140 Type 2 diabetes mellitus with diabetic neuropathy, unspecified: Secondary | ICD-10-CM | POA: Insufficient documentation

## 2020-07-10 NOTE — Assessment & Plan Note (Signed)
Change simvastatin to rosuvastatin 20mg  daily given last lipid results and concurrent diabetes.

## 2020-07-10 NOTE — Assessment & Plan Note (Signed)
Uncontrolled with a1c of 11.1. Poor control largely due to inadequate follow up. It has been challenging to get her to come into clinic, but she is always pleasant and reasonable when she is here. Again stressed importance of regular follow up in order to get diabetes under control. Fasting sugars elevated, plenty of room to increase insulin. Increase insulin to NPH 48u in AM and 38 u in PM.  Follow up in a few weeks with me to continue titrating insulin. Offered to help adjust over the phone if patient is willing.

## 2020-07-10 NOTE — Progress Notes (Signed)
  Date of Visit: 07/06/2020   SUBJECTIVE:   HPI:  Destiny Morton presents today for routine follow up.  Diabetes - currently taking NPH 45 u in AM and 35u in PM. Fasting sugars are running in 180s. Lowest she gets is 160s. Has noticed some pain in L foot, cannot describe it more than pain. Due for foot exam today. No preceding injuries.  Hypertension - taking amlodipine 10mg  and metoprolol 50mg  twice daily. Blood pressures at home run 120s/80s.   Hyperlipidemia - taking simvastatin 20mg  daily. Tolerating well.  OBJECTIVE:   BP (!) 142/80   Pulse 83   Ht 5\' 7"  (1.702 m)   Wt 162 lb 6.4 oz (73.7 kg)   SpO2 100%   BMI 25.44 kg/m  Gen: no acute distress, pleasant cooperative, well appearing HEENT: normocephalic, atraumatic Heart: regular rate and rhythm, no murmur Lungs: clear to auscultation bilaterally, normal work of breathing  Neuro: grossly nonfocal, speech normal Ext: No appreciable lower extremity edema bilaterally  Diabetic foot exam: DP pulses challenging to feel bilaterally. Feet are warm without ischemic lesions. Tinea pedis present between toes on bilateral feet. Absent sensation with monofilament testing bilaterally.    ASSESSMENT/PLAN:   Health maintenance:  -foot exam done today -encouraged COVID vaccination, patient prefers to wait a few weeks to get this as she has things to do today -mammogram & DEXA ordered, patient will schedule in a few months (wait on mammo due to upcoming COVID vax, can cause lymphadenopathy temporarily) -declines pneumovax today -urine microalbumin collected today  Hyperlipidemia Change simvastatin to rosuvastatin 20mg  daily given last lipid results and concurrent diabetes.   DM (diabetes mellitus), type 2, uncontrolled (HCC) Uncontrolled with a1c of 11.1. Poor control largely due to inadequate follow up. It has been challenging to get her to come into clinic, but she is always pleasant and reasonable when she is here. Again stressed  importance of regular follow up in order to get diabetes under control. Fasting sugars elevated, plenty of room to increase insulin. Increase insulin to NPH 48u in AM and 38 u in PM.  Follow up in a few weeks with me to continue titrating insulin. Offered to help adjust over the phone if patient is willing.  HYPERTENSION, BENIGN SYSTEMIC Reasonable control here, and well controlled based on home numbers. Continue current medications.   Diabetic neuropathy (HCC) Foot exam concerning for diabetic neuropathy. Discussed option of medication (gabapentin) but patient prefers to try topical. rx capsaicin. Also rx clotrimazole to treat tinea pedis.  Since pedal pulses were challenging to feel, will obtain formal ABIs. Patient is at risk for vasculopathy given poorly controlled diabetes.   FOLLOW UP: Follow up in 1 mo for diabetes   J. , MD [facility]

## 2020-07-10 NOTE — Assessment & Plan Note (Signed)
Reasonable control here, and well controlled based on home numbers. Continue current medications.

## 2020-07-10 NOTE — Assessment & Plan Note (Signed)
Foot exam concerning for diabetic neuropathy. Discussed option of medication (gabapentin) but patient prefers to try topical. rx capsaicin. Also rx clotrimazole to treat tinea pedis.  Since pedal pulses were challenging to feel, will obtain formal ABIs. Patient is at risk for vasculopathy given poorly controlled diabetes.

## 2020-07-13 ENCOUNTER — Ambulatory Visit (HOSPITAL_COMMUNITY): Admission: RE | Admit: 2020-07-13 | Payer: Medicare HMO | Source: Ambulatory Visit

## 2020-07-14 ENCOUNTER — Telehealth: Payer: Self-pay

## 2020-07-14 NOTE — Telephone Encounter (Signed)
Patient LVM on nurse line stating that she never received her pain medication for her foot. Patient states that she received the antifungal but would like rx for pain to be sent into the pharmacy.   To PCP  Veronda Prude, RN

## 2020-07-14 NOTE — Telephone Encounter (Signed)
I did send in the cream for her foot (capsaicin). It says her pharmacy received it. Can you contact pharmacy and inquire?  Thanks [name], MD

## 2020-07-17 ENCOUNTER — Telehealth: Payer: Self-pay | Admitting: *Deleted

## 2020-07-17 NOTE — Telephone Encounter (Signed)
-----   Message from [name], MD sent at 07/10/2020 11:21 PM EDT ----- I ordered a formal ABI on this patient at her recent visit. Can you schedule an appointment for it and contact her with the appointment info?  Thanks! [name], MD

## 2020-07-17 NOTE — Telephone Encounter (Signed)
Spoke with pt and informed her of her appt time. Emsley Custer Bruna Potter, CMA

## 2020-07-18 MED ORDER — CAPSAICIN 0.075 % EX GEL: CUTANEOUS | 1 refills | Status: DC

## 2020-07-18 NOTE — Telephone Encounter (Signed)
Contacted pharmacy, they never received.  Resent. Jone Baseman, CMA

## 2020-07-24 ENCOUNTER — Ambulatory Visit (HOSPITAL_COMMUNITY): Payer: Medicare HMO

## 2020-08-01 ENCOUNTER — Other Ambulatory Visit: Payer: Self-pay | Admitting: Family Medicine

## 2020-08-01 ENCOUNTER — Ambulatory Visit: Payer: Medicare HMO | Admitting: Family Medicine

## 2020-08-07 ENCOUNTER — Other Ambulatory Visit: Payer: Self-pay | Admitting: Family Medicine

## 2020-08-10 NOTE — Telephone Encounter (Signed)
Please ask patient to schedule a follow up visit with me. She no showed to her last visit Thanks [name], MD

## 2020-08-11 NOTE — Telephone Encounter (Signed)
LVM for patient to make an appointment with Dr. Pollie Meyer.  Glennie Hawk, CMA'

## 2020-08-15 ENCOUNTER — Other Ambulatory Visit: Payer: Self-pay | Admitting: Family Medicine

## 2020-10-29 ENCOUNTER — Other Ambulatory Visit: Payer: Self-pay | Admitting: Family Medicine

## 2020-10-30 NOTE — Telephone Encounter (Signed)
Please ask patient to schedule a follow up visit with me, thanks! Destiny Morton J Odaly Peri, MD  

## 2020-10-31 NOTE — Telephone Encounter (Signed)
LMOVM for pt to call back and schedule an appt. Roselee Tayloe, CMA  

## 2020-11-19 ENCOUNTER — Other Ambulatory Visit: Payer: Self-pay | Admitting: Family Medicine

## 2020-12-05 ENCOUNTER — Encounter (HOSPITAL_COMMUNITY): Payer: Medicare HMO

## 2020-12-06 ENCOUNTER — Ambulatory Visit: Payer: Medicare HMO

## 2020-12-12 ENCOUNTER — Other Ambulatory Visit: Payer: Self-pay | Admitting: Family Medicine

## 2020-12-18 NOTE — Telephone Encounter (Signed)
Patient calls nurse line requesting status of rx refills.   Veronda Prude, RN

## 2020-12-28 ENCOUNTER — Ambulatory Visit: Payer: Medicare HMO | Admitting: Family Medicine

## 2021-01-05 DIAGNOSIS — E113532 Type 2 diabetes mellitus with proliferative diabetic retinopathy with traction retinal detachment not involving the macula, left eye: Secondary | ICD-10-CM | POA: Diagnosis not present

## 2021-01-05 DIAGNOSIS — E113591 Type 2 diabetes mellitus with proliferative diabetic retinopathy without macular edema, right eye: Secondary | ICD-10-CM | POA: Diagnosis not present

## 2021-01-25 DIAGNOSIS — T85398A Other mechanical complication of other ocular prosthetic devices, implants and grafts, initial encounter: Secondary | ICD-10-CM | POA: Diagnosis not present

## 2021-01-25 DIAGNOSIS — Z8669 Personal history of other diseases of the nervous system and sense organs: Secondary | ICD-10-CM | POA: Diagnosis not present

## 2021-01-25 DIAGNOSIS — H26492 Other secondary cataract, left eye: Secondary | ICD-10-CM | POA: Diagnosis not present

## 2021-01-25 DIAGNOSIS — Z48810 Encounter for surgical aftercare following surgery on the sense organs: Secondary | ICD-10-CM | POA: Diagnosis not present

## 2021-01-29 ENCOUNTER — Other Ambulatory Visit: Payer: Self-pay | Admitting: Family Medicine

## 2021-02-02 NOTE — Telephone Encounter (Signed)
Please ask patient to schedule a follow up visit with me, thanks! Esbeidy Mclaine J Ouita Nish, MD  

## 2021-02-05 ENCOUNTER — Telehealth: Payer: Self-pay

## 2021-02-05 NOTE — Telephone Encounter (Signed)
LVM for patient to call [facility] to make an appointment with Dr. Pollie Meyer.  Glennie Hawk, CMA

## 2021-02-06 DIAGNOSIS — H35373 Puckering of macula, bilateral: Secondary | ICD-10-CM | POA: Diagnosis not present

## 2021-02-06 DIAGNOSIS — H4313 Vitreous hemorrhage, bilateral: Secondary | ICD-10-CM | POA: Diagnosis not present

## 2021-02-06 DIAGNOSIS — E113532 Type 2 diabetes mellitus with proliferative diabetic retinopathy with traction retinal detachment not involving the macula, left eye: Secondary | ICD-10-CM | POA: Diagnosis not present

## 2021-02-06 DIAGNOSIS — E113511 Type 2 diabetes mellitus with proliferative diabetic retinopathy with macular edema, right eye: Secondary | ICD-10-CM | POA: Diagnosis not present

## 2021-03-01 ENCOUNTER — Telehealth: Payer: Self-pay

## 2021-03-01 NOTE — Telephone Encounter (Signed)
Patient LVM on nurse line asking PCP to call and reschedule her "vascular scan." I see where an order was placed back in July 2021. I attempted to call patient back, however no answer. Will forward to PCP to see if test is still necessary and will be happy to call and reschedule for her.

## 2021-03-05 NOTE — Telephone Encounter (Signed)
It's fine to schedule the ABI for her, but it is more important that she keep her appointment with me in April.  Thanks [name], MD

## 2021-03-06 DIAGNOSIS — E113511 Type 2 diabetes mellitus with proliferative diabetic retinopathy with macular edema, right eye: Secondary | ICD-10-CM | POA: Diagnosis not present

## 2021-03-06 DIAGNOSIS — H4311 Vitreous hemorrhage, right eye: Secondary | ICD-10-CM | POA: Diagnosis not present

## 2021-03-06 DIAGNOSIS — E113592 Type 2 diabetes mellitus with proliferative diabetic retinopathy without macular edema, left eye: Secondary | ICD-10-CM | POA: Diagnosis not present

## 2021-03-09 ENCOUNTER — Other Ambulatory Visit: Payer: Self-pay | Admitting: Family Medicine

## 2021-03-20 ENCOUNTER — Other Ambulatory Visit: Payer: Self-pay

## 2021-03-20 ENCOUNTER — Encounter: Payer: Self-pay | Admitting: Family Medicine

## 2021-03-20 ENCOUNTER — Ambulatory Visit (INDEPENDENT_AMBULATORY_CARE_PROVIDER_SITE_OTHER): Payer: Medicare HMO | Admitting: Family Medicine

## 2021-03-20 VITALS — BP 160/72 | HR 77 | Wt 158.4 lb

## 2021-03-20 DIAGNOSIS — E11649 Type 2 diabetes mellitus with hypoglycemia without coma: Secondary | ICD-10-CM

## 2021-03-20 DIAGNOSIS — E1142 Type 2 diabetes mellitus with diabetic polyneuropathy: Secondary | ICD-10-CM

## 2021-03-20 DIAGNOSIS — I10 Essential (primary) hypertension: Secondary | ICD-10-CM | POA: Diagnosis not present

## 2021-03-20 DIAGNOSIS — R0989 Other specified symptoms and signs involving the circulatory and respiratory systems: Secondary | ICD-10-CM

## 2021-03-20 DIAGNOSIS — E785 Hyperlipidemia, unspecified: Secondary | ICD-10-CM

## 2021-03-20 LAB — POCT GLYCOSYLATED HEMOGLOBIN (HGB A1C): HbA1c, POC (controlled diabetic range): 10 % — AB (ref 0.0–7.0)

## 2021-03-20 MED ORDER — EMPAGLIFLOZIN 10 MG PO TABS: 10.0000 mg | ORAL_TABLET | Freq: Every day | ORAL | 0 refills | Status: DC

## 2021-03-20 MED ORDER — CAPSAICIN 0.075 % EX GEL: CUTANEOUS | 1 refills | Status: DC

## 2021-03-20 NOTE — Progress Notes (Signed)
  Date of Visit: 03/20/2021   SUBJECTIVE:   HPI:  Destiny Morton presents today for routine follow up.   Diabetes - taking NPH 48u in AM and 38 in PM. Fasting sugars run around 170s. Lowest she got was 102 when she had not eaten, typically sugars run much higher though.  Hypertension - taking metoprolol 50mg  twice daily and amlodipine 10mg  daily. Tolerating these well. Blood pressures at home tend to run 120s/80s, she does check with some regularity.  Hyperlipidemia - has been taking simvastatin 20mg  daily, never got rx for crestor but is willing to get this.  Pain in feet - has pain in both feet, L>R, burning type pain. Previously we had set her up for ABIs but she no-showed to that appointment. She would like to set those up again.  Had surgery on her eyes back in the fall, through [facility].  OBJECTIVE:   BP (!) 160/72   Pulse 77   Wt 158 lb 6.4 oz (71.8 kg)   SpO2 99%   BMI 24.81 kg/m  Gen: no acute distress, pleasant, cooperative HEENT: normocephalic, atraumatic  Heart: regular rate and rhythm, no murmur Lungs: clear to auscultation bilaterally, normal work of breathing  Neuro: alert, speech normal, grossly nonfocal Ext: No appreciable lower extremity edema bilaterally. Pedal pulses challenging to palpate bilaterally. No signs of ischemia on distal feet. Thickened darkened toenails bilaterally.  ASSESSMENT/PLAN:   Health maintenance:  -again encouraged COVID vaccine, she declines today but is contemplating it -mammogram ordered, handout given on how to schedule -DEXA ordered, patient to schedule along with mammogram -declines pneumonia vaccine today  DM (diabetes mellitus), type 2, uncontrolled (HCC) A1c today 10, not at goal. Reviewed options of increasing insulin or adding an additional medication like an SGLT2 inhibitor. Patient would like to start jardiance. Sent in rx for this, for 10mg  daily. She will follow up with me in 1 month, at which time we will likely  increase to 25mg  daily if she is tolerating it well. Reviewed she should hold this medication during any period of potential dehydration, should also alert to any yeast infections or UTI symptoms.  Follow up in 1 month.   Hyperlipidemia Update lipids today, along with CMET. Would benefit from higher intensity statin, represcribed crestor 20mg .  HYPERTENSION, BENIGN SYSTEMIC Blood pressure elevated here but reportedly at goal on home readings. Will continue present medications. Recheck in 1 month when I see her back.  Diabetic neuropathy (HCC) rx capsaicin ointment for patient  (she never did get this or try this as previously recommended) Reschedule ABIs given difficulty with palpating pedal pulses  Refer to podiatry for foot care  FOLLOW UP: Follow up in 1 mo for diabetes  Referring to podiatry  J. [facility], MD [facility]

## 2021-03-20 NOTE — Patient Instructions (Addendum)
Rescheduling test for blood flow to your legs  Start jardiance 10mg  daily  Stop simvastatin. Switch to rosuvastatin. I sent this in for you.  Checking labs today - kidneys, liver, cholesterol  Recommend you get the COVID vaccine and pneumonia shot  To schedule your mammogram and bone density test, call the [facility] at [phone]. They are located at [address], [address].    Referring to foot doctor  Follow up with me in 1 month.  Be well, Dr. [address]

## 2021-03-21 ENCOUNTER — Telehealth: Payer: Self-pay

## 2021-03-21 LAB — LIPID PANEL
Chol/HDL Ratio: 5.4 ratio — ABNORMAL HIGH (ref 0.0–4.4)
Cholesterol, Total: 189 mg/dL (ref 100–199)
HDL: 35 mg/dL — ABNORMAL LOW (ref >39)
LDL Chol Calc (NIH): 135 mg/dL — ABNORMAL HIGH (ref 0–99)
Triglycerides: 101 mg/dL (ref 0–149)
VLDL Cholesterol Cal: 19 mg/dL (ref 5–40)

## 2021-03-21 LAB — CMP14+EGFR
ALT: 19 IU/L (ref 0–32)
AST: 15 IU/L (ref 0–40)
Albumin/Globulin Ratio: 1.2 (ref 1.2–2.2)
Albumin: 4 g/dL (ref 3.8–4.8)
Alkaline Phosphatase: 103 IU/L (ref 44–121)
BUN/Creatinine Ratio: 14 (ref 12–28)
BUN: 13 mg/dL (ref 8–27)
Bilirubin Total: 0.7 mg/dL (ref 0.0–1.2)
CO2: 22 mmol/L (ref 20–29)
Calcium: 9.2 mg/dL (ref 8.7–10.3)
Chloride: 103 mmol/L (ref 96–106)
Creatinine, Ser: 0.93 mg/dL (ref 0.57–1.00)
Globulin, Total: 3.3 g/dL (ref 1.5–4.5)
Glucose: 167 mg/dL — ABNORMAL HIGH (ref 65–99)
Potassium: 4 mmol/L (ref 3.5–5.2)
Sodium: 141 mmol/L (ref 134–144)
Total Protein: 7.3 g/dL (ref 6.0–8.5)
eGFR: 68 mL/min/{1.73_m2} (ref >59)

## 2021-03-21 NOTE — Telephone Encounter (Signed)
Please see below for PA determination.       PA approved from 02/19/21-03/21/22. Called and informed pharmacist. Pharmacist ran medication through for $68 for 3 month supply. Called to updated patient. Patient did not answer, left HIPAA compliant VM to return call to office if she has further questions.   Veronda Prude, RN

## 2021-03-21 NOTE — Telephone Encounter (Signed)
Received fax from pharmacy regarding insurance not covering Jardiance rx. Please see below formulary for insurance preferred medications.   -Glimepiride -Glipizide -Glipizide ER -Glipizide- Metformin Hcl -Metformin HCL -Metformin HCL ER - Pioglitazone HCL  Please advise if alternative is appropriate or if PA should be initiated. Please route back to RN team if you would like PA.

## 2021-03-21 NOTE — Telephone Encounter (Signed)
I would like to do a PA so she can get jardiance  Thanks [name], MD

## 2021-03-22 ENCOUNTER — Ambulatory Visit (HOSPITAL_COMMUNITY): Admission: RE | Admit: 2021-03-22 | Payer: Medicare HMO | Source: Ambulatory Visit

## 2021-03-25 ENCOUNTER — Encounter: Payer: Self-pay | Admitting: Family Medicine

## 2021-03-25 MED ORDER — ROSUVASTATIN CALCIUM 20 MG PO TABS: 20.0000 mg | ORAL_TABLET | Freq: Every day | ORAL | 3 refills | Status: DC

## 2021-03-25 NOTE — Assessment & Plan Note (Signed)
Update lipids today, along with CMET. Would benefit from higher intensity statin, represcribed crestor 20mg .

## 2021-03-25 NOTE — Assessment & Plan Note (Signed)
rx capsaicin ointment for patient  (she never did get this or try this as previously recommended) Reschedule ABIs given difficulty with palpating pedal pulses

## 2021-03-25 NOTE — Assessment & Plan Note (Signed)
Blood pressure elevated here but reportedly at goal on home readings. Will continue present medications. Recheck in 1 month when I see her back.

## 2021-03-25 NOTE — Assessment & Plan Note (Signed)
A1c today 10, not at goal. Reviewed options of increasing insulin or adding an additional medication like an SGLT2 inhibitor. Patient would like to start jardiance. Sent in rx for this, for 10mg  daily. She will follow up with me in 1 month, at which time we will likely increase to 25mg  daily if she is tolerating it well. Reviewed she should hold this medication during any period of potential dehydration, should also alert to any yeast infections or UTI symptoms.  Follow up in 1 month.

## 2021-04-03 ENCOUNTER — Ambulatory Visit: Payer: Medicare HMO | Admitting: Podiatry

## 2021-04-04 ENCOUNTER — Ambulatory Visit (HOSPITAL_COMMUNITY): Payer: Medicare HMO

## 2021-04-09 ENCOUNTER — Other Ambulatory Visit: Payer: Self-pay | Admitting: Family Medicine

## 2021-04-11 ENCOUNTER — Ambulatory Visit (HOSPITAL_COMMUNITY)
Admission: RE | Admit: 2021-04-11 | Discharge: 2021-04-11 | Disposition: A | Discharge status: Home / Self Care | Payer: Medicare HMO | Source: Ambulatory Visit | Attending: Family Medicine | Admitting: Family Medicine

## 2021-04-11 ENCOUNTER — Other Ambulatory Visit: Payer: Self-pay

## 2021-04-11 DIAGNOSIS — R0989 Other specified symptoms and signs involving the circulatory and respiratory systems: Secondary | ICD-10-CM | POA: Diagnosis not present

## 2021-04-11 NOTE — Progress Notes (Signed)
ABI exam has been completed.   Results can be found under chart review under CV PROC. 04/11/2021 2:05 PM Ebone Alcivar RVT, RDMS

## 2021-04-12 ENCOUNTER — Telehealth: Payer: Self-pay | Admitting: Family Medicine

## 2021-04-12 DIAGNOSIS — R6889 Other general symptoms and signs: Secondary | ICD-10-CM

## 2021-04-12 NOTE — Telephone Encounter (Addendum)
Called patient to discuss ABI results, which demonstrate moderate PAD. She does admit to pain in her legs when she walks long distances.  She is on a high intensity statin (crestor 20mg ) and aspirin 81mg  daily.   Stressed importance to her of getting diabetes under control in order to prevent progression of PAD. She admits she did not get the jardiance as it was too expensive ($68 copay).  Recommend that we send her to see a vascular specialist for the PAD, to determine what the best next steps are. She is very hesitant to schedule with another doctor because she has a lot going on in her life. I explained that I am worried she could eventually lose her legs if we don't act to treat her PAD and prevent progression. She seemed to understand this.  Plan: - will send message to [facility] pharmacy team to see if there is a way to make jardiance more affordable for her - refer to vascular surgery - scheduled follow up visit with me in office on 5/17  [facility], MD

## 2021-04-13 NOTE — Telephone Encounter (Signed)
I will reach out to patient about possible patient assistance with [facility] for the Jardiance.

## 2021-04-18 ENCOUNTER — Telehealth: Payer: Self-pay

## 2021-04-18 NOTE — Telephone Encounter (Signed)
Left voicemail regarding possible patient assistance thru [facility] [employer] for medication (jardiance).  Call back #[phone]

## 2021-04-24 ENCOUNTER — Ambulatory Visit: Payer: Medicare HMO | Admitting: Family Medicine

## 2021-05-08 ENCOUNTER — Other Ambulatory Visit: Payer: Self-pay

## 2021-05-08 ENCOUNTER — Encounter: Payer: Self-pay | Admitting: Family Medicine

## 2021-05-08 ENCOUNTER — Ambulatory Visit (INDEPENDENT_AMBULATORY_CARE_PROVIDER_SITE_OTHER): Payer: Medicare HMO | Admitting: Family Medicine

## 2021-05-08 DIAGNOSIS — I10 Essential (primary) hypertension: Secondary | ICD-10-CM

## 2021-05-08 DIAGNOSIS — E785 Hyperlipidemia, unspecified: Secondary | ICD-10-CM | POA: Diagnosis not present

## 2021-05-08 DIAGNOSIS — I739 Peripheral vascular disease, unspecified: Secondary | ICD-10-CM

## 2021-05-08 DIAGNOSIS — E11649 Type 2 diabetes mellitus with hypoglycemia without coma: Secondary | ICD-10-CM

## 2021-05-08 MED ORDER — POLYETHYLENE GLYCOL 3350 17 GM/SCOOP PO POWD: 17.0000 g | Freq: Every day | ORAL | 2 refills | Status: AC | PRN

## 2021-05-08 NOTE — Assessment & Plan Note (Signed)
Moderately controlled after recent insulin increase with good fastings at 109, though some higher evening checks up to 200s. Continue 50u NPH morning and 38 NPH evening. Begin checking sugars in the afternoon and bring records to next visit Advised reducing her substantial daily juice intake and provided education about sugar content Gave phone number to our pharmacy team for pt to follow up on possible assistance for Jardiance. RTC on 06/19/21 at 11:10am

## 2021-05-08 NOTE — Assessment & Plan Note (Addendum)
04/11/21 ABI revealed BLE moderate artery disease. Referred pt to Vasc surg for further management, who has tried to contact the pt several times. (Pt's phone having problems) Gave pt number for vascular surgery.

## 2021-05-08 NOTE — Assessment & Plan Note (Signed)
Possible white coat hypertension as home BP readings are at goal of <135/90. Has not previously tolerated thiazides, ACEs or ARBs. Asked pt to bring home BP cuff to next appointment Continue 50mg  Metoprolol and 10mg  Norvasc

## 2021-05-08 NOTE — Progress Notes (Signed)
SUBJECTIVE:   CHIEF COMPLAINT / HPI:   Destiny Morton returns today for follow up.  T2DM Destiny Morton wasn't able to afford Jardiance after our last visit, so she was increased from 48u to 50u morning NPH 3 weeks ago, and has continued on 38u evening NPH. She checks her sugars twice a day, reports no lows, and reports fastings around 109 in the last 3 weeks. During discussion about diet, pt noted that she drinks 3-4 16oz cups of juice daily. Our pharmacy team did reach out for assistance with Jardiance but pt has not returned the call.  HTN Last visit on 03/20/21 had BP at 160/72, and today had 148/75. Reports BP checks at home consistently in 120s/80s. Continues on 10mg  Norvasc and 50mg  Metoprolol.   HLD Increased to 20mg  Crestor at last visit after lipid panel on 03/20/21 revealed continued dyslipidemia despite previously taking Simvastatin. Denies any muscle aches or difficulties tolerating Crestor.  Pedal Claudication Had Vascular doppler on 04/11/21 which revealed bilateral lower extremity, moderate arterial disease. Pt notes that if she walks far her "legs feel tired, and achy". Denies these symptoms at baseline. Have referred pt to Vasc surgery, but pt has missed phone calls from their office. She is agreeable to calling for a follow up appointment    OBJECTIVE:   BP (!) 148/75   Pulse 76   Ht 5\' 7"  (1.702 m)   Wt 153 lb (69.4 kg)   SpO2 99%   BMI 23.96 kg/m   Physical Exam Constitutional:      Appearance: Normal appearance.  Cardiovascular:     Rate and Rhythm: Normal rate and regular rhythm.     Heart sounds: Normal heart sounds.  Pulmonary:     Effort: Pulmonary effort is normal.     Breath sounds: Normal breath sounds.  Skin:    General: Skin is warm and dry.  Neurological:     Mental Status: She is alert.  Psychiatric:        Mood and Affect: Mood normal.        Behavior: Behavior normal.      ASSESSMENT/PLAN:   HYPERTENSION, BENIGN  SYSTEMIC Possible white coat hypertension as home BP readings are at goal of <135/90. Has not previously tolerated thiazides, ACEs or ARBs. Asked pt to bring home BP cuff to next appointment Continue 50mg  Metoprolol and 10mg  Norvasc  DM (diabetes mellitus), type 2, uncontrolled (HCC) Moderately controlled after recent insulin increase with good fastings at 109, though some higher evening checks up to 200s. Continue 50u NPH morning and 38 NPH evening. Begin checking sugars in the afternoon and bring records to next visit Advised reducing her substantial daily juice intake and provided education about sugar content Gave phone number to our pharmacy team for pt to follow up on possible assistance for Jardiance. RTC on 06/19/21 at 11:10am  Hyperlipidemia Continue 20mg  Crestor  Peripheral artery disease (HCC) 04/11/21 ABI revealed BLE moderate artery disease. Referred pt to Vasc surg for further management, who has tried to contact the pt several times. (Pt's phone having problems) Gave pt number for vascular surgery.     06/11/21, Medical Student [facility]    Patient seen along with MS3 student . I personally evaluated this patient along with the student, and verified all aspects of the history, physical exam, and medical decision making as documented by the student. I agree with the student's documentation and have made all necessary edits.  Levert Feinstein, MD  [facility]

## 2021-05-08 NOTE — Assessment & Plan Note (Signed)
Continue 20mg  Crestor

## 2021-05-08 NOTE — Patient Instructions (Addendum)
Call the Vascular Surgery office to schedule an appointment.  [phone]  Call back Jannette Spanner at [phone] - she can try to help with the cost of Jardiance.  Decrease how much juice you are drinking  Stay on current insulin amount  Get capsaicin over the counter  Follow up with me in July as scheduled.  Be well, Dr. Pollie Meyer

## 2021-05-11 DIAGNOSIS — H2511 Age-related nuclear cataract, right eye: Secondary | ICD-10-CM | POA: Diagnosis not present

## 2021-05-11 DIAGNOSIS — H4313 Vitreous hemorrhage, bilateral: Secondary | ICD-10-CM | POA: Diagnosis not present

## 2021-05-11 DIAGNOSIS — Z961 Presence of intraocular lens: Secondary | ICD-10-CM | POA: Diagnosis not present

## 2021-05-11 DIAGNOSIS — H35373 Puckering of macula, bilateral: Secondary | ICD-10-CM | POA: Diagnosis not present

## 2021-05-11 DIAGNOSIS — E113511 Type 2 diabetes mellitus with proliferative diabetic retinopathy with macular edema, right eye: Secondary | ICD-10-CM | POA: Diagnosis not present

## 2021-05-24 DIAGNOSIS — H2511 Age-related nuclear cataract, right eye: Secondary | ICD-10-CM | POA: Diagnosis not present

## 2021-05-24 DIAGNOSIS — H35371 Puckering of macula, right eye: Secondary | ICD-10-CM | POA: Diagnosis not present

## 2021-05-24 DIAGNOSIS — E113511 Type 2 diabetes mellitus with proliferative diabetic retinopathy with macular edema, right eye: Secondary | ICD-10-CM | POA: Diagnosis not present

## 2021-05-24 DIAGNOSIS — H4311 Vitreous hemorrhage, right eye: Secondary | ICD-10-CM | POA: Diagnosis not present

## 2021-05-24 DIAGNOSIS — E113521 Type 2 diabetes mellitus with proliferative diabetic retinopathy with traction retinal detachment involving the macula, right eye: Secondary | ICD-10-CM | POA: Diagnosis not present

## 2021-05-25 DIAGNOSIS — H35371 Puckering of macula, right eye: Secondary | ICD-10-CM | POA: Diagnosis not present

## 2021-05-25 DIAGNOSIS — H4311 Vitreous hemorrhage, right eye: Secondary | ICD-10-CM | POA: Diagnosis not present

## 2021-05-25 DIAGNOSIS — E113511 Type 2 diabetes mellitus with proliferative diabetic retinopathy with macular edema, right eye: Secondary | ICD-10-CM | POA: Diagnosis not present

## 2021-06-01 DIAGNOSIS — E113511 Type 2 diabetes mellitus with proliferative diabetic retinopathy with macular edema, right eye: Secondary | ICD-10-CM | POA: Diagnosis not present

## 2021-06-02 ENCOUNTER — Other Ambulatory Visit: Payer: Self-pay | Admitting: Family Medicine

## 2021-06-05 ENCOUNTER — Other Ambulatory Visit: Payer: Self-pay | Admitting: Family Medicine

## 2021-06-19 ENCOUNTER — Ambulatory Visit: Payer: Medicare HMO | Admitting: Family Medicine

## 2021-06-22 DIAGNOSIS — E113592 Type 2 diabetes mellitus with proliferative diabetic retinopathy without macular edema, left eye: Secondary | ICD-10-CM | POA: Diagnosis not present

## 2021-06-22 DIAGNOSIS — E113511 Type 2 diabetes mellitus with proliferative diabetic retinopathy with macular edema, right eye: Secondary | ICD-10-CM | POA: Diagnosis not present

## 2021-07-07 ENCOUNTER — Other Ambulatory Visit: Payer: Self-pay | Admitting: Family Medicine

## 2021-07-09 NOTE — Telephone Encounter (Signed)
Please let patient know I am refilling this medication, but she needs to schedule an appointment with me.   Thanks, Jaliah Foody J Azriella Mattia, MD  

## 2021-07-09 NOTE — Telephone Encounter (Signed)
Called patient to inform her of scheduling a follow up with PCP.  There was no answer, LVM.  Marland KitchenGlennie Hawk, CMA

## 2021-08-04 ENCOUNTER — Other Ambulatory Visit: Payer: Self-pay | Admitting: Family Medicine

## 2021-08-07 ENCOUNTER — Other Ambulatory Visit: Payer: Self-pay

## 2021-08-07 ENCOUNTER — Ambulatory Visit (INDEPENDENT_AMBULATORY_CARE_PROVIDER_SITE_OTHER): Payer: Medicare HMO | Admitting: Family Medicine

## 2021-08-07 ENCOUNTER — Encounter: Payer: Self-pay | Admitting: Family Medicine

## 2021-08-07 VITALS — BP 142/85 | HR 83 | Wt 154.2 lb

## 2021-08-07 DIAGNOSIS — E11649 Type 2 diabetes mellitus with hypoglycemia without coma: Secondary | ICD-10-CM | POA: Diagnosis not present

## 2021-08-07 DIAGNOSIS — E2839 Other primary ovarian failure: Secondary | ICD-10-CM

## 2021-08-07 DIAGNOSIS — I739 Peripheral vascular disease, unspecified: Secondary | ICD-10-CM | POA: Diagnosis not present

## 2021-08-07 DIAGNOSIS — L910 Hypertrophic scar: Secondary | ICD-10-CM | POA: Diagnosis not present

## 2021-08-07 DIAGNOSIS — I10 Essential (primary) hypertension: Secondary | ICD-10-CM | POA: Diagnosis not present

## 2021-08-07 DIAGNOSIS — Z1231 Encounter for screening mammogram for malignant neoplasm of breast: Secondary | ICD-10-CM

## 2021-08-07 DIAGNOSIS — N898 Other specified noninflammatory disorders of vagina: Secondary | ICD-10-CM

## 2021-08-07 DIAGNOSIS — E785 Hyperlipidemia, unspecified: Secondary | ICD-10-CM

## 2021-08-07 LAB — POCT GLYCOSYLATED HEMOGLOBIN (HGB A1C): HbA1c, POC (controlled diabetic range): 10.5 % — AB (ref 0.0–7.0)

## 2021-08-07 LAB — POCT URINALYSIS DIP (MANUAL ENTRY)
Bilirubin, UA: NEGATIVE
Blood, UA: NEGATIVE
Glucose, UA: 100 mg/dL — AB
Ketones, POC UA: NEGATIVE mg/dL
Leukocytes, UA: NEGATIVE
Nitrite, UA: NEGATIVE
Protein Ur, POC: NEGATIVE mg/dL
Spec Grav, UA: 1.02 (ref 1.010–1.025)
Urobilinogen, UA: 0.2 E.U./dL
pH, UA: 7 (ref 5.0–8.0)

## 2021-08-07 MED ORDER — MICONAZOLE NITRATE 2 % VA CREA: 1.0000 | TOPICAL_CREAM | Freq: Every day | VAGINAL | 0 refills | Status: DC

## 2021-08-07 MED ORDER — INSULIN NPH (HUMAN) (ISOPHANE) 100 UNIT/ML ~~LOC~~ SUSP: SUBCUTANEOUS | 3 refills | Status: DC

## 2021-08-07 NOTE — Assessment & Plan Note (Signed)
Well controlled on current medication via lipid panel obtained in April 2022.

## 2021-08-07 NOTE — Assessment & Plan Note (Signed)
Discussed referral to [facility] Dermatology clinic for evaluation. Advised that removal will likely be difficult but steroid injections may help reduce size of keloid.

## 2021-08-07 NOTE — Assessment & Plan Note (Addendum)
Patient does not recall discussion updating her on ABI. Discussed results again today and stressed importance of follow up with vascular surgery. Provided phone # so she can schedule with vascular surgery office.

## 2021-08-07 NOTE — Assessment & Plan Note (Addendum)
Uncontrolled on daily 108 units of NPH insulin, 50 units in the morning and 38 units at night. HbA1C today was 10.5, increased from 10.0 in May of 2022. Will increase night time insulin from 38 units to 40 units to work towards controlling morning blood sugars Scheduled for pharmacy visit on 08/15/21 to assess blood sugar and adjust dosing. Requested eye exam records from her ophthalmologist. Destiny Morton is committed to making dietary changes and we discussed finding diabetic friendly protein beverages to minimize sugar intake.

## 2021-08-07 NOTE — Progress Notes (Signed)
SUBJECTIVE:   CHIEF COMPLAINT / HPI:   Ms. Livengood presents today for follow up for chronic conditio  Diabetes - Is taking 50 units of NPH in the morning and 38 units at night. At her last visit on 05/01/21, her HbA1c was 10.0.  - She has cut out fruit juices since then and has been trying to exercise, however she is drinking other protein beverages that have high sugar content. She finds that on days she does laundry and is walking about she gets dizzy and notices her blood sugars are low. She knows to take a sugary snack.  - Blood sugars are typically 170s in the morning and can go as low as 93 sometimes.  Hypertension - Is taking amlodipine 10mg  daily and metoprolol 50mg  twice daily as prescribed.  Keloid - has area under R armpit concerning for keloid. Ms. Louderback would like to know if there is anything she can do to reduce the scar. She initially thought it was persistent rash from singles. It does not itch, burn or bother her, but she would rather not have it.  UTI vs yeast infection - She has had burning when peeing and intense itchiness near her vagina/vulva for the past 2 weeks. She tried monistat without any relief. Declines pelvic exam today.  Retinopathy - She has been visiting two separate eye surgeons for cataracts and retinopathy and has had many surgeries done over the past few months. She feels she can see better now but still needs far sighted glasses to see small print.  - She has requested getting a prescription to make sure she can see better. She is following up with her eye doctors on 08/20/21.  - She temporarily stopped taking aspirin in preparation for surgery. She will discuss restarting aspirin at 08/20/21 visit.  Social - Ms. Nusbaum has had many challenging life stressors in the past few months. Her sister in law recently passed which has been difficult. Her brother had a stroke and was wheelchair bound in the hospital, and her mother was also recently in the  hospital and got discharged. In the context of these events and Ms. Summer's own eye surgeries, it has been difficult for her to dedicate attention to her own health.  OBJECTIVE:   BP (!) 142/85   Pulse 83   Wt 154 lb 3.2 oz (69.9 kg)   SpO2 100%   BMI 24.15 kg/m   GEN: Well appearing, in no acute distress, pleasant, cooperative CARD: Normal rate and rhythm. No murmurs or gallops. PULM: Clear to auscultation, normal work of breathing GI: Soft-nontender SKIN: Warm, dry. Hyperpigmented, raised patch under right arm about 2 cm by 4 cm consistent with keloid   ASSESSMENT/PLAN:   Peripheral artery disease (HCC) Patient does not recall discussion updating her on ABI. Discussed results again today and stressed importance of follow up with vascular surgery. Provided phone # so she can schedule with vascular surgery office.   HYPERTENSION, BENIGN SYSTEMIC Moderately controlled on current medication regimen of 50 mg lopressor twice daily and amlodipine 10mg  daily. Slightly elevated in office today. Patient is adherent to medication. No medication changes today. Offered education/counseling that her heart rate will still increase when she exercise on lopressor. She does not need to be concerned that it will prevent her from getting the benefits of exercising.  DM (diabetes mellitus), type 2, uncontrolled (HCC) Uncontrolled on daily 108 units of NPH insulin, 50 units in the morning and 38 units at night. HbA1C today  was 10.5, increased from 10.0 in May of 2022. Will increase night time insulin from 38 units to 40 units to work towards controlling morning blood sugars Scheduled for pharmacy visit on 08/15/21 to assess blood sugar and adjust dosing. Requested eye exam records from her ophthalmologist. Ms. Zavalza is committed to making dietary changes and we discussed finding diabetic friendly protein beverages to minimize sugar intake.  Hyperlipidemia Well controlled on current medication via  lipid panel obtained in April 2022.  Keloid Discussed referral to [facility] Dermatology clinic for evaluation. Advised that removal will likely be difficult but steroid injections may help reduce size of keloid.   Vulvar itching/burning Suspect yeast infection. Rx miconazole. Advised she will need to come in for pelvic exam if symptoms not improved.  UA without signs of infection  Health Maintenance -Sent referrals for mammogram and DEXA scan. Patient knows she will need to call to schedule an appointment. -Obtaining urine microalbumin today - appears was never sent, will cancel order -Deferring all other health maintenance at this time given the breadth of health challenges in her and her family's life. Scheduled a follow up visit for October 11th to put together plan to address other health maintenance items. Ms. Komar is deferring Pneumovax, COVID-19 vaccine series and Shingrix until next visit.  Provided extensive counseling on the benefit of COVID-19 vaccine to Ms. Grennan in the context of her uncontrolled chronic medication conditions. Ms. Chaudhari is aware and would like to think on the decision some more.  Alroy Bailiff, Medical Student [facility]   Patient seen along with MS3 student Alease Medina. I personally evaluated this patient along with the student, and verified all aspects of the history, physical exam, and medical decision making as documented by the student. I agree with the student's documentation and have made all necessary edits.  Levert Feinstein, MD  [facility]

## 2021-08-07 NOTE — Assessment & Plan Note (Addendum)
Moderately controlled on current medication regimen of 50 mg lopressor twice daily and amlodipine 10mg  daily. Slightly elevated in office today. Patient is adherent to medication. No medication changes today. Offered education/counseling that her heart rate will still increase when she exercise on lopressor. She does not need to be concerned that it will prevent her from getting the benefits of exercising.

## 2021-08-07 NOTE — Patient Instructions (Addendum)
Schedule with dermatology clinic here for your keloid  Sent in cream for yeast infection Checking urine  To schedule your mammogram and bone density test, call the [facility] at [phone]. They are located at [address], [address].         Call the Vascular Surgery office to schedule an appointment.  [phone]

## 2021-08-09 LAB — HM DIABETES EYE EXAM

## 2021-08-15 ENCOUNTER — Ambulatory Visit: Payer: Medicare HMO | Admitting: Pharmacist

## 2021-08-29 DIAGNOSIS — E113593 Type 2 diabetes mellitus with proliferative diabetic retinopathy without macular edema, bilateral: Secondary | ICD-10-CM | POA: Diagnosis not present

## 2021-08-29 DIAGNOSIS — H31093 Other chorioretinal scars, bilateral: Secondary | ICD-10-CM | POA: Diagnosis not present

## 2021-09-18 ENCOUNTER — Ambulatory Visit: Payer: Medicare HMO | Admitting: Family Medicine

## 2021-09-25 DIAGNOSIS — H524 Presbyopia: Secondary | ICD-10-CM | POA: Diagnosis not present

## 2021-09-25 DIAGNOSIS — Z0100 Encounter for examination of eyes and vision without abnormal findings: Secondary | ICD-10-CM | POA: Diagnosis not present

## 2021-11-26 ENCOUNTER — Other Ambulatory Visit: Payer: Self-pay | Admitting: Family Medicine

## 2021-12-18 ENCOUNTER — Other Ambulatory Visit: Payer: Self-pay | Admitting: Family Medicine

## 2021-12-26 DIAGNOSIS — H31093 Other chorioretinal scars, bilateral: Secondary | ICD-10-CM | POA: Diagnosis not present

## 2021-12-26 DIAGNOSIS — E113593 Type 2 diabetes mellitus with proliferative diabetic retinopathy without macular edema, bilateral: Secondary | ICD-10-CM | POA: Diagnosis not present

## 2021-12-26 DIAGNOSIS — H35372 Puckering of macula, left eye: Secondary | ICD-10-CM | POA: Diagnosis not present

## 2021-12-26 LAB — HM DIABETES EYE EXAM

## 2021-12-29 ENCOUNTER — Other Ambulatory Visit: Payer: Self-pay | Admitting: Family Medicine

## 2022-01-07 ENCOUNTER — Ambulatory Visit: Payer: Medicare HMO | Admitting: Family Medicine

## 2022-02-05 ENCOUNTER — Ambulatory Visit: Payer: Medicare HMO | Admitting: Family Medicine

## 2022-02-23 ENCOUNTER — Other Ambulatory Visit: Payer: Self-pay | Admitting: Family Medicine

## 2022-02-28 ENCOUNTER — Ambulatory Visit: Payer: Medicare HMO | Admitting: Family Medicine

## 2022-04-04 ENCOUNTER — Ambulatory Visit: Payer: Medicare HMO | Admitting: Family Medicine

## 2022-04-05 DIAGNOSIS — H4313 Vitreous hemorrhage, bilateral: Secondary | ICD-10-CM | POA: Diagnosis not present

## 2022-04-05 DIAGNOSIS — H40023 Open angle with borderline findings, high risk, bilateral: Secondary | ICD-10-CM | POA: Diagnosis not present

## 2022-04-05 DIAGNOSIS — Z961 Presence of intraocular lens: Secondary | ICD-10-CM | POA: Diagnosis not present

## 2022-04-05 DIAGNOSIS — E113511 Type 2 diabetes mellitus with proliferative diabetic retinopathy with macular edema, right eye: Secondary | ICD-10-CM | POA: Diagnosis not present

## 2022-04-05 DIAGNOSIS — H35373 Puckering of macula, bilateral: Secondary | ICD-10-CM | POA: Diagnosis not present

## 2022-05-19 ENCOUNTER — Other Ambulatory Visit: Payer: Self-pay | Admitting: Family Medicine

## 2022-05-21 NOTE — Telephone Encounter (Signed)
Please let patient know I am refilling this medication, but she needs to schedule AND ATTEND an appointment with me. She has canceled or no showed to many, many appointments in the last six months.  Thanks, [name], MD

## 2022-06-01 ENCOUNTER — Other Ambulatory Visit: Payer: Self-pay | Admitting: Family Medicine

## 2022-06-26 DIAGNOSIS — H31093 Other chorioretinal scars, bilateral: Secondary | ICD-10-CM | POA: Diagnosis not present

## 2022-06-26 DIAGNOSIS — H26491 Other secondary cataract, right eye: Secondary | ICD-10-CM | POA: Diagnosis not present

## 2022-06-26 DIAGNOSIS — E113593 Type 2 diabetes mellitus with proliferative diabetic retinopathy without macular edema, bilateral: Secondary | ICD-10-CM | POA: Diagnosis not present

## 2022-06-26 DIAGNOSIS — H35372 Puckering of macula, left eye: Secondary | ICD-10-CM | POA: Diagnosis not present

## 2022-07-04 ENCOUNTER — Ambulatory Visit (INDEPENDENT_AMBULATORY_CARE_PROVIDER_SITE_OTHER): Payer: Medicare HMO | Admitting: Family Medicine

## 2022-07-04 VITALS — BP 127/78 | HR 75 | Wt 161.8 lb

## 2022-07-04 DIAGNOSIS — E785 Hyperlipidemia, unspecified: Secondary | ICD-10-CM

## 2022-07-04 DIAGNOSIS — E1151 Type 2 diabetes mellitus with diabetic peripheral angiopathy without gangrene: Secondary | ICD-10-CM

## 2022-07-04 DIAGNOSIS — E11649 Type 2 diabetes mellitus with hypoglycemia without coma: Secondary | ICD-10-CM | POA: Diagnosis not present

## 2022-07-04 DIAGNOSIS — Z91199 Patient's noncompliance with other medical treatment and regimen due to unspecified reason: Secondary | ICD-10-CM | POA: Diagnosis not present

## 2022-07-04 DIAGNOSIS — I739 Peripheral vascular disease, unspecified: Secondary | ICD-10-CM | POA: Diagnosis not present

## 2022-07-04 DIAGNOSIS — L910 Hypertrophic scar: Secondary | ICD-10-CM | POA: Diagnosis not present

## 2022-07-04 DIAGNOSIS — I10 Essential (primary) hypertension: Secondary | ICD-10-CM

## 2022-07-04 LAB — POCT GLYCOSYLATED HEMOGLOBIN (HGB A1C): HbA1c, POC (controlled diabetic range): 10.5 % — AB (ref 0.0–7.0)

## 2022-07-04 MED ORDER — ACCU-CHEK SOFTCLIX LANCETS MISC: 12 refills | Status: DC

## 2022-07-04 MED ORDER — CLOTRIMAZOLE 1 % EX CREA: 1.0000 | TOPICAL_CREAM | Freq: Two times a day (BID) | CUTANEOUS | 0 refills | Status: DC

## 2022-07-04 NOTE — Assessment & Plan Note (Addendum)
Concern for worsening PAD given swelling, skin changes, and pain with ambulation. Have discussed with patient multiple times in the past the imperative nature of seeing vascular surgery, as she is at risk of limb loss if this issue is not addressed. She does not recall these discussions in detail - says she was busy in the fall getting her eyes taken care of (had eye procedures done).   Stressed importance of seeing vascular surgery. I personally called [facility] office the day following this appointment (7/28) to get her an appointment. They needed a new referral placed, which I did (designated as urgent). Per [facility] representative, they will send to their referral RN to review and will contact patient for an appointment. I asked to be notified if they cannot reach her.  Follow up visit scheduled with me in September to follow up on this and other items, so this does not fall through the cracks.

## 2022-07-04 NOTE — Progress Notes (Signed)
Date of Visit: 07/04/2022   HPI:  Destiny Morton is a 66 y.o. female who presents to clinic for follow-up:  Leg swelling, rash, and pain Patient reports her ankles have been swollen for 3 months. It is not better or worse with elevation. It is worse at night. Leg and foot rash began 1 month ago. It is not itchy. She has tried baby oil and lotion without improvement. Leg pain began 1-2 months ago. Pain is worse with walking long distances. She denies SOB, orthopnea, or PND.  Diabetes FBGs are 160s-170s. She takes NPH 50u in the AM before meals and 40u in the PM before bedtime. She reports one episode of dizziness when FBG was 99. She has decreased her intake of fruit juices. Exercise has been difficult due to leg pain.  Hypertension No headaches or CP. She is taking Amlodipine 10mg  daily and Metoprolol 50mg  twice daily without side effects.  Foot Fungus She ran out of Clotrimazole cream a few days ago. She reports cream helps with toenail fungus and in between her toes. She requests a refill.  Keloid Present for 5 years in right armpit chest area. She reports it has recently started to hurt and bother her. She denies change in size or color. Is interested in having this area treated with steroid injections.  PHYSICAL EXAM: BP 127/78 (BP Location: Right Arm, Patient Position: Sitting, Cuff Size: Large)   Pulse 75   Wt 161 lb 12.8 oz (73.4 kg)   SpO2 100%   BMI 25.34 kg/m  Gen: Well appearing female in no distress CV: Normal rate and rhythm. No murmurs or rubs. Pitting edema +1 on bilateral feet and shins.  Pulm: No increased work of breathing. No wheezes or crackles. Ext: mildly cool feet bilaterally. Shiny, smooth, red, and dry bilateral feet and ankles. Unable to palpate pedal pulses bilaterally. Hyperpigmented poorly defined rash present over bilateral feet and ankles (?lipodermatosclerosis?). Thickened hyperpigmented toenails present without skin breakdown between toes. Neuro:  Alert and oriented. Skin: 2cm keloid in right axillary area.      ASSESSMENT/PLAN:  HYPERTENSION, BENIGN SYSTEMIC Initially elevated at 170/87. At goal on recheck 127/78. Home BPs are 130s/80s. HTN at goal. Continue to check home BPs. Continue current regimen of Amlodipine 10mg  daily and Metoprolol Tartate 50mg  daily.  Peripheral artery disease (HCC) Concern for worsening PAD given swelling, skin changes, and pain with ambulation. Have discussed with patient multiple times in the past the imperative nature of seeing vascular surgery, as she is at risk of limb loss if this issue is not addressed. She does not recall these discussions in detail - says she was busy in the fall getting her eyes taken care of (had eye procedures done).   Stressed importance of seeing vascular surgery. I personally called [facility] office the day following this appointment (7/28) to get her an appointment. They needed a new referral placed, which I did (designated as urgent). Per [facility] representative, they will send to their referral RN to review and will contact patient for an appointment. I asked to be notified if they cannot reach her.  Follow up visit scheduled with me in September to follow up on this and other items, so this does not fall through the cracks.  Type 2 diabetes mellitus with peripheral artery disease (HCC) Uncontrolled with A1C today 10.5, unchanged from A1C 07/2021. Increase NPH to 53u AM and 43u in PM before meals. Continue to work on lifestyle changes. CMP and urine microalbumin today. Refilled Lancets.  Follow up in 6 weeks to see how she is doing. Stressed again the importance of regular follow up with me, even if by phone.  Keloid Ongoing irritation, desires steroid injections to treat. Refer to [facility] derm clinic for management.  Hyperlipidemia Lipid panel today. Continue Rosuvastatin.  Frequent No-show for appointment Discussed with patient the importance of regular follow-up to manage chronic  conditions and improve health outcomes. Also reviewed our clinic's trend towards dismissing patients who frequently no-show or cancel appointments same day, and that she is at risk of this due to her frequent no shows/same day cancellations. Patient stated her understanding.  Toenail fungus Stable. Refilled Clotrimazole cream. Advised onychomycosis requires oral medication to treat, though more pressing issue is her PAD so hold on any oral rx for onychomycosis at this time.  Health Maintenance Unable to discuss due to multiple other issues - address at follow up visit in Sept.  Follow-up Return to clinic on 08/15/22 for routine follow-up.  Cecilie Kicks, MS3 [facility]  Patient seen along with MS3 student Cecilie Kicks. I personally evaluated this patient along with the student, and verified all aspects of the history, physical exam, and medical decision making as documented by the student. I agree with the student's documentation and have made all necessary edits.  Levert Feinstein, MD  [facility]

## 2022-07-04 NOTE — Assessment & Plan Note (Signed)
Initially elevated at 170/87. At goal on recheck 127/78. Home BPs are 130s/80s. HTN at goal. Continue to check home BPs. Continue current regimen of Amlodipine 10mg  daily and Metoprolol Tartate 50mg  daily.

## 2022-07-04 NOTE — Assessment & Plan Note (Signed)
Lipid panel today. Continue Rosuvastatin.

## 2022-07-04 NOTE — Assessment & Plan Note (Addendum)
Ongoing irritation, desires steroid injections to treat. Refer to [facility] derm clinic for management.

## 2022-07-04 NOTE — Assessment & Plan Note (Addendum)
Uncontrolled with A1C today 10.5, unchanged from A1C 07/2021. Increase NPH to 53u AM and 43u in PM before meals. Continue to work on lifestyle changes. CMP and urine microalbumin today. Refilled Lancets. Follow up in 6 weeks to see how she is doing. Stressed again the importance of regular follow up with me, even if by phone.

## 2022-07-04 NOTE — Assessment & Plan Note (Addendum)
Discussed with patient the importance of regular follow-up to manage chronic conditions and improve health outcomes. Also reviewed our clinic's trend towards dismissing patients who frequently no-show or cancel appointments same day, and that she is at risk of this due to her frequent no shows/same day cancellations. Patient stated her understanding.

## 2022-07-04 NOTE — Patient Instructions (Signed)
It was great to see you again today!  Go up to 53u in AM and 43u in PM  Will schedule with vascular surgeon and call you with appointment.  Someone will call about dermatology clinic  Refilled clotrimazole and lancets  Follow up in Sept as scheduled. Call with questions sooner  Be well, Dr. Pollie Meyer

## 2022-07-05 LAB — BASIC METABOLIC PANEL
BUN/Creatinine Ratio: 16 (ref 12–28)
BUN: 16 mg/dL (ref 8–27)
CO2: 21 mmol/L (ref 20–29)
Calcium: 9.7 mg/dL (ref 8.7–10.3)
Chloride: 105 mmol/L (ref 96–106)
Creatinine, Ser: 0.98 mg/dL (ref 0.57–1.00)
Glucose: 71 mg/dL (ref 70–99)
Potassium: 3.8 mmol/L (ref 3.5–5.2)
Sodium: 144 mmol/L (ref 134–144)
eGFR: 63 mL/min/{1.73_m2} (ref >59)

## 2022-07-05 LAB — LIPID PANEL
Chol/HDL Ratio: 5.3 ratio — ABNORMAL HIGH (ref 0.0–4.4)
Cholesterol, Total: 189 mg/dL (ref 100–199)
HDL: 36 mg/dL — ABNORMAL LOW (ref >39)
LDL Chol Calc (NIH): 135 mg/dL — ABNORMAL HIGH (ref 0–99)
Triglycerides: 98 mg/dL (ref 0–149)
VLDL Cholesterol Cal: 18 mg/dL (ref 5–40)

## 2022-07-05 LAB — MICROALBUMIN / CREATININE URINE RATIO
Creatinine, Urine: 218.5 mg/dL
Microalb/Creat Ratio: 7 mg/g creat (ref 0–29)
Microalbumin, Urine: 14.5 ug/mL

## 2022-07-08 ENCOUNTER — Encounter: Payer: Self-pay | Admitting: Family Medicine

## 2022-07-08 ENCOUNTER — Telehealth: Payer: Self-pay | Admitting: Family Medicine

## 2022-07-08 MED ORDER — ROSUVASTATIN CALCIUM 40 MG PO TABS: 40.0000 mg | ORAL_TABLET | Freq: Every day | ORAL | 3 refills | Status: DC

## 2022-07-08 NOTE — Telephone Encounter (Signed)
Attempted to reach patient to discuss lab results. No answer, LVM asking her to call back.  When she calls back please tell her:  - cholesterol numbers are still high. I would recommend we increase her cholesterol medication (rosuvastatin) to 40mg  daily. I am sending in a new prescription.  - the vascular surgery office should be calling her, as should our dermatology clinic.  If she has questions I am happy to speak with her. I will be on vacation the rest of this week and Dr. will be covering my inbox. She can either choose to speak with him or wait until I am back in the office next week.  Deirdre Priest, MD

## 2022-07-08 NOTE — Telephone Encounter (Signed)
Patient returns call to nurse line. Advised of message per Dr. Pollie Meyer.   Veronda Prude, RN

## 2022-07-19 ENCOUNTER — Other Ambulatory Visit: Payer: Self-pay | Admitting: Family Medicine

## 2022-07-25 ENCOUNTER — Ambulatory Visit (INDEPENDENT_AMBULATORY_CARE_PROVIDER_SITE_OTHER): Payer: Medicare HMO | Admitting: Student

## 2022-07-25 VITALS — BP 142/70 | HR 74 | Ht 67.0 in | Wt 161.0 lb

## 2022-07-25 DIAGNOSIS — L910 Hypertrophic scar: Secondary | ICD-10-CM | POA: Diagnosis not present

## 2022-07-25 MED ORDER — TRIAMCINOLONE ACETONIDE 40 MG/ML IJ SUSP
40.0000 mg | Freq: Once | INTRAMUSCULAR | Status: AC
  Administered 2022-07-25: 40 mg via INTRADERMAL

## 2022-07-25 MED ORDER — TRIAMCINOLONE ACETONIDE 40 MG/ML IJ SUSP: 40.0000 mg | Freq: Once | INTRAMUSCULAR | Status: DC

## 2022-07-25 NOTE — Patient Instructions (Addendum)
  It was nice seeing you today. We completed steroid injection of your keloid without any complication. Please, keep the area clean and dry and contact us soon if you have persistent pain or discharge from this area. Otherwise, we will see you back in 4 weeks.

## 2022-07-25 NOTE — Progress Notes (Signed)
    SUBJECTIVE:   CHIEF COMPLAINT / HPI: Keloid under right armpit  Patient has keloid under right armpit that has slowly gotten bigger for a few years. She says she originally got shignles and then this area got more inflamed. She has had a possible keloid on her ear before which she says she ahs gotten injections for which has now resolved.  OBJECTIVE:   BP (!) 142/70   Pulse 74   Ht 5\' 7"  (1.702 m)   Wt 161 lb (73 kg)   SpO2 98%   BMI 25.22 kg/m     PRE-OP DIAGNOSIS: Right armpit keloid POST-OP DIAGNOSIS: Same  PROCEDURE: intralesional injection Performing Physician: MD  Supervising Physician (if applicable): Eniola MD  Steroid 40mg /mL Triamcinolone acetonide   Procedure: The area was prepped in the usual sterile manner with alcohol swabs. The needle was inserted into the affected area and the steroid was injected along the entire lesion. Cold spray used for anesthetic. There were no complications during this procedure.  Followup: The patient tolerated the procedure well without  complications.  Standard post-procedure care is explained and return  precautions are given.  Likely will need additional intralesional steroid injections

## 2022-08-01 ENCOUNTER — Other Ambulatory Visit: Payer: Self-pay | Admitting: *Deleted

## 2022-08-01 DIAGNOSIS — R0989 Other specified symptoms and signs involving the circulatory and respiratory systems: Secondary | ICD-10-CM

## 2022-08-12 NOTE — Progress Notes (Deleted)
[facility] [place]  ASSESSMENT / PLAN: Destiny Morton is a 66 y.o. female with atherosclerosis of *** native arteries of *** causing {Chronic PAD levels:25303}.  Patient counseled {pad risk2:26283}  WIfI score calculated based on clinical exam and non-invasive measurements. {WIFIvascular:26096}  Recommend the following which can slow the progression of atherosclerosis and reduce the risk of major adverse cardiac / limb events:  Complete cessation from all tobacco products. Blood glucose control with goal A1c < 7%. Blood pressure control with goal blood pressure < 140/90 mmHg. Lipid reduction therapy with goal LDL-C <100 mg/dL (<93 if symptomatic from PAD).  Aspirin 81mg  PO QD.  *** Clopidogrel 75mg  PO QD. *** Rivaroxaban 2.5mg  PO BID. *** Cilostozal 100mg  PO BID for intermittent claudication without evidence of heart failure. Atorvastatin 40-80mg  PO QD (or other "high intensity" statin therapy). *** Daily walking to and past the point of discomfort. Patient counseled to keep a log of exercise distance. *** Adequate hydration (at least 2 liters / day) if patient's heart and kidney function is adequate.  Plan *** lower extremity angiogram with possible intervention via *** approach in cath lab ***.    CHIEF COMPLAINT: ***  HISTORY OF PRESENT ILLNESS: Destiny Morton is a 66 y.o. female ***  VASCULAR SURGICAL HISTORY: ***  VASCULAR RISK FACTORS: {FINDINGS; POSITIVE [phone]} history of stroke / transient ischemic attack. {FINDINGS; POSITIVE [phone]} history of coronary artery disease. *** history of PCI. *** history of CABG.  {FINDINGS; POSITIVE [phone]} history of diabetes mellitus. Last A1c ***. {FINDINGS; POSITIVE [phone]} history of smoking. *** actively smoking. {FINDINGS; POSITIVE [phone]} history of hypertension. *** drug regimen with *** control. {FINDINGS; POSITIVE [phone]}  history of chronic kidney disease.  Last GFR ***. CKD {stage:30421363}. {FINDINGS; POSITIVE [phone]} history of chronic obstructive pulmonary disease, treated with ***.  FUNCTIONAL STATUS: ECOG performance status: {findings; ecog performance status:31780} Ambulatory status: {TNHAmbulation:25868}  Past Medical History:  Diagnosis Date   Diabetes mellitus    Hypertension     No past surgical history on file.  No family history on file.  Social History   Socioeconomic History   Marital status: Married    Spouse name: Not on file   Number of children: Not on file   Years of education: Not on file   Highest education level: Not on file  Occupational History   Not on file  Tobacco Use   Smoking status: Never   Smokeless tobacco: Never  Substance and Sexual Activity   Alcohol use: No   Drug use: No   Sexual activity: Not on file  Other Topics Concern   Not on file  Social History Narrative   Not on file   Social Determinants of Health   Financial Resource Strain: Not on file  Food Insecurity: Not on file  Transportation Needs: Not on file  Physical Activity: Not on file  Stress: Not on file  Social Connections: Not on file  Intimate Partner Violence: Not on file    Allergies  Allergen Reactions   Metformin And Related Diarrhea and Nausea And Vomiting   Losartan Potassium-Hctz Other (See Comments)    Dizziness with this medication - Headache Felt like she was going to pass out.   Hydrochlorothiazide Other (See Comments)    dizziness   Valsartan Other (See Comments)    we believe patient might have a drug intolerance to ARBs.  Symptoms listed in HPI    Current Outpatient Medications  Medication Sig Dispense Refill   ACCU-CHEK  GUIDE test strip USE AS INSTRUCTED 100 strip 12   Accu-Chek Softclix Lancets lancets Use as instructed 100 each 12   amLODipine (NORVASC) 10 MG tablet TAKE 1 TABLET BY MOUTH EVERY DAY 90 tablet 0   aspirin 81 MG EC tablet Take  81 mg by mouth daily.     b complex vitamins tablet Take 1 tablet by mouth daily.     B-D INS SYR ULTRAFINE 1CC/30G 30G X 1/2" 1 ML MISC USE AS DIRECTED TWICE A DAY 100 each 3   Calcium Carbonate (CVS CALCIUM-600 PO) Take by mouth.     Capsaicin 0.075 % GEL Apply topically four times a day 30 g 1   Cholecalciferol (VITAMIN D3) 25 MCG (1000 UT) CAPS Take 2,000 Units by mouth daily.     clotrimazole (LOTRIMIN) 1 % cream Apply 1 Application topically 2 (two) times daily. Between toes 60 g 0   empagliflozin (JARDIANCE) 10 MG TABS tablet Take 1 tablet (10 mg total) by mouth daily. (Patient not taking: Reported on 05/08/2021) 90 tablet 0   fish oil-omega-3 fatty acids 1000 MG capsule Take 1 g by mouth daily.     insulin NPH Human (HUMULIN N) 100 UNIT/ML injection INJECT 50 UNITS UNDER THE SKIN IN THE MORNING AND 40 UNITS IN THE EVENING BEFORE MEALS 90 mL 0   MAGNESIUM PO Take 1 tablet by mouth daily.     metoprolol tartrate (LOPRESSOR) 50 MG tablet TAKE 1 TABLET BY MOUTH TWICE A DAY 180 tablet 0   miconazole (MICONAZOLE 7) 2 % vaginal cream Place 1 Applicatorful vaginally at bedtime. 45 g 0   Multiple Vitamin (MULTIVITAMIN WITH MINERALS) TABS Take 1 tablet by mouth daily.     polyethylene glycol powder (MIRALAX) 17 GM/SCOOP powder Take 17 g by mouth daily as needed. 507 g 2   rosuvastatin (CRESTOR) 40 MG tablet Take 1 tablet (40 mg total) by mouth daily. 90 tablet 3   No current facility-administered medications for this visit.    PHYSICAL EXAM There were no vitals filed for this visit.  Constitutional: *** appearing. *** distress. Appears *** nourished.  Neurologic: CN ***. *** focal findings. *** sensory loss. Psychiatric: *** Mood and affect symmetric and appropriate. Eyes: *** No icterus. No conjunctival pallor. Ears, nose, throat: *** mucous membranes moist. Midline trachea.  Cardiac: *** rate and rhythm.  Respiratory: *** unlabored. Abdominal: *** soft, non-tender, non-distended.   Peripheral vascular: *** Extremity: *** edema. *** cyanosis. *** pallor.  Skin: *** gangrene. *** ulceration.  Lymphatic: *** Stemmer's sign. *** palpable lymphadenopathy.    PERTINENT LABORATORY AND RADIOLOGIC DATA  Most recent CBC    Latest Ref Rng & Units 05/26/2015    9:48 AM 07/12/2013   10:28 AM 04/16/2013    9:13 AM  CBC  WBC 4.0 - 10.5 K/uL 5.1  8.0  5.2   Hemoglobin 12.0 - 15.0 g/dL 16.0  73.7  10.6   Hematocrit 36.0 - 46.0 % 40.1  31.8  33.1   Platelets 150 - 400 K/uL 280  383  329      Most recent CMP    Latest Ref Rng & Units 07/04/2022    3:25 PM 03/20/2021   11:14 AM 03/16/2020    9:39 AM  CMP  Glucose 70 - 99 mg/dL 71  269  485   BUN 8 - 27 mg/dL 16  13  14    Creatinine 0.57 - 1.00 mg/dL  4.62  7.03   Sodium 134 - 144  mmol/L 144  141  137   Potassium 3.5 - 5.2 mmol/L 3.8  4.0  4.0   Chloride 96 - 106 mmol/L 105  103  100   CO2 20 - 29 mmol/L 21  22  23    Calcium 8.7 - 10.3 mg/dL 9.7  9.2  9.3   Total Protein 6.0 - 8.5 g/dL  7.3  7.3   Total Bilirubin 0.0 - 1.2 mg/dL  0.7  0.3   Alkaline Phos 44 - 121 IU/L  103  126   AST 0 - 40 IU/L  15  15   ALT 0 - 32 IU/L  19  21     Renal function CrCl cannot be calculated (Patient's most recent lab result is older than the maximum 21 days allowed.).  HbA1c, POC (controlled diabetic range) (%)  Date Value  07/04/2022 10.5 (A)    LDL Chol Calc (NIH)  Date Value Ref Range Status  07/04/2022 135 (H) 0 - 99 mg/dL Final     Vascular Imaging: ***  Destiny Morton N. 07/06/2022, MD [facility] Phone Number: [phone] 08/12/2022 5:04 PM  Total time spent on preparing this encounter including chart review, data review, collecting history, examining the patient, coordinating care for this {tnhtimebilling:26202}  Portions of this report may have been transcribed using voice recognition software.  Every effort has been made to ensure accuracy; however, inadvertent computerized  transcription errors may still be present.

## 2022-08-13 ENCOUNTER — Encounter: Payer: Medicare HMO | Admitting: Vascular Surgery

## 2022-08-13 ENCOUNTER — Ambulatory Visit (HOSPITAL_COMMUNITY): Payer: Medicare HMO

## 2022-08-14 ENCOUNTER — Other Ambulatory Visit: Payer: Self-pay | Admitting: Family Medicine

## 2022-08-15 ENCOUNTER — Ambulatory Visit: Payer: Medicare HMO | Admitting: Family Medicine

## 2022-08-26 ENCOUNTER — Other Ambulatory Visit: Payer: Self-pay | Admitting: Family Medicine

## 2022-09-02 ENCOUNTER — Ambulatory Visit (INDEPENDENT_AMBULATORY_CARE_PROVIDER_SITE_OTHER): Payer: Medicare HMO | Admitting: Surgery

## 2022-09-02 ENCOUNTER — Ambulatory Visit (HOSPITAL_COMMUNITY)
Admission: RE | Admit: 2022-09-02 | Discharge: 2022-09-02 | Disposition: A | Discharge status: Home / Self Care | Payer: Medicare HMO | Source: Ambulatory Visit | Attending: Vascular Surgery | Admitting: Vascular Surgery

## 2022-09-02 ENCOUNTER — Encounter: Payer: Self-pay | Admitting: Surgery

## 2022-09-02 VITALS — BP 150/80 | HR 71 | Temp 97.9°F | Resp 20 | Ht 67.0 in | Wt 162.8 lb

## 2022-09-02 DIAGNOSIS — R0989 Other specified symptoms and signs involving the circulatory and respiratory systems: Secondary | ICD-10-CM | POA: Insufficient documentation

## 2022-09-02 DIAGNOSIS — I70213 Atherosclerosis of native arteries of extremities with intermittent claudication, bilateral legs: Secondary | ICD-10-CM | POA: Diagnosis not present

## 2022-09-02 MED ORDER — CILOSTAZOL 100 MG PO TABS: 100.0000 mg | ORAL_TABLET | Freq: Two times a day (BID) | ORAL | 11 refills | Status: DC

## 2022-09-02 NOTE — Progress Notes (Signed)
[facility]ist of [place]  Patient name: Destiny Morton MRN: [id number] DOB: 03-Aug-1956 Sex: female   REQUESTING PROVIDER:    Chrisandra Netters   REASON FOR CONSULT:    PAD  HISTORY OF PRESENT ILLNESS:   Destiny Morton is a 66 y.o. female, who is referred for evaluation of leg pain.  She states that for the past 2 months she has been having pain in her legs.  This begins at approximately a few 100 feet of walking.  Both legs bother her the same.  She gets calf cramping bilaterally.  Resting helps alleviate her symptoms.  She denies rest pain or ulceration.  She does complain of skin peeling and mild swelling   The patient suffers from diabetes.  Blood sugars range in the 1 60-1 70 range.  She is medically managed for hypertension.  She is a non-smoker.  she takes a statin for hypercholesterolemia  PAST MEDICAL HISTORY    Past Medical History:  Diagnosis Date   Diabetes mellitus    Hypertension      FAMILY HISTORY   History reviewed. No pertinent family history.  SOCIAL HISTORY:   Social History   Socioeconomic History   Marital status: Married    Spouse name: Not on file   Number of children: Not on file   Years of education: Not on file   Highest education level: Not on file  Occupational History   Not on file  Tobacco Use   Smoking status: Never   Smokeless tobacco: Never  Substance and Sexual Activity   Alcohol use: No   Drug use: No   Sexual activity: Not on file  Other Topics Concern   Not on file  Social History Narrative   Not on file   Social Determinants of Health   Financial Resource Strain: Not on file  Food Insecurity: Not on file  Transportation Needs: Not on file  Physical Activity: Not on file  Stress: Not on file  Social Connections: Not on file  Intimate Partner Violence: Not on file    ALLERGIES:    Allergies  Allergen Reactions   Metformin And Related Diarrhea and  Nausea And Vomiting   Losartan Potassium-Hctz Other (See Comments)    Dizziness with this medication - Headache Felt like she was going to pass out.   Hydrochlorothiazide Other (See Comments)    dizziness   Valsartan Other (See Comments)    we believe patient might have a drug intolerance to ARBs.  Symptoms listed in HPI    CURRENT MEDICATIONS:    Current Outpatient Medications  Medication Sig Dispense Refill   ACCU-CHEK GUIDE test strip USE AS INSTRUCTED 100 strip 12   Accu-Chek Softclix Lancets lancets Use as instructed 100 each 12   amLODipine (NORVASC) 10 MG tablet TAKE 1 TABLET BY MOUTH EVERY DAY 90 tablet 0   aspirin 81 MG EC tablet Take 81 mg by mouth daily.     b complex vitamins tablet Take 1 tablet by mouth daily.     B-D INS SYR ULTRAFINE 1CC/30G 30G X 1/2" 1 ML MISC USE AS DIRECTED TWICE A DAY 100 each 3   Calcium Carbonate (CVS CALCIUM-600 PO) Take by mouth.     Capsaicin 0.075 % GEL Apply topically four times a day 30 g 1   Cholecalciferol (VITAMIN D3) 25 MCG (1000 UT) CAPS Take 2,000 Units by mouth daily.     clotrimazole (LOTRIMIN) 1 % cream Apply 1 Application topically 2 (  two) times daily. Between toes 60 g 0   empagliflozin (JARDIANCE) 10 MG TABS tablet Take 1 tablet (10 mg total) by mouth daily. (Patient not taking: Reported on 05/08/2021) 90 tablet 0   fish oil-omega-3 fatty acids 1000 MG capsule Take 1 g by mouth daily.     insulin NPH Human (HUMULIN N) 100 UNIT/ML injection INJECT 50 UNITS UNDER THE SKIN IN THE MORNING AND 40 UNITS IN THE EVENING BEFORE MEALS 90 mL 0   MAGNESIUM PO Take 1 tablet by mouth daily.     metoprolol tartrate (LOPRESSOR) 50 MG tablet TAKE 1 TABLET BY MOUTH TWICE A DAY 180 tablet 0   miconazole (MICONAZOLE 7) 2 % vaginal cream Place 1 Applicatorful vaginally at bedtime. (Patient not taking: Reported on 09/02/2022) 45 g 0   Multiple Vitamin (MULTIVITAMIN WITH MINERALS) TABS Take 1 tablet by mouth daily.     polyethylene glycol powder  (MIRALAX) 17 GM/SCOOP powder Take 17 g by mouth daily as needed. 507 g 2   rosuvastatin (CRESTOR) 40 MG tablet Take 1 tablet (40 mg total) by mouth daily. 90 tablet 3   No current facility-administered medications for this visit.    REVIEW OF SYSTEMS:   [X]  denotes positive finding, [ ]  denotes negative finding Cardiac  Comments:  Chest pain or chest pressure:    Shortness of breath upon exertion:    Short of breath when lying flat:    Irregular heart rhythm:        Vascular    Pain in calf, thigh, or hip brought on by ambulation: X   Pain in feet at night that wakes you up from your sleep:     Blood clot in your veins:    Leg swelling:         Pulmonary    Oxygen at home:    Productive cough:     Wheezing:         Neurologic    Sudden weakness in arms or legs:     Sudden numbness in arms or legs:     Sudden onset of difficulty speaking or slurred speech:    Temporary loss of vision in one eye:     Problems with dizziness:         Gastrointestinal    Blood in stool:      Vomited blood:         Genitourinary    Burning when urinating:     Blood in urine:        Psychiatric    Major depression:         Hematologic    Bleeding problems:    Problems with blood clotting too easily:        Skin    Rashes or ulcers:        Constitutional    Fever or chills:     PHYSICAL EXAM:   Vitals:   09/02/22 1410  BP: (!) 150/80  Pulse: 71  Resp: 20  Temp: 97.9 F (36.6 C)  SpO2: 98%  Weight: 162 lb 12.8 oz (73.8 kg)  Height: 5\' 7"  (1.702 m)    GENERAL: The patient is a well-nourished female, in no acute distress. The vital signs are documented above. CARDIAC: There is a regular rate and rhythm.  VASCULAR: Palpable femoral pulses bilaterally.  Nonpalpable pedal pulses PULMONARY: Nonlabored respirations ABDOMEN: Soft and non-tender .  MUSCULOSKELETAL: There are no major deformities or cyanosis. NEUROLOGIC: No focal weakness or paresthesias are detected. SKIN:  There are no ulcers or rashes noted. PSYCHIATRIC: The patient has a normal affect.  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  I discussed with her that the most important thing is to treat her appropriately from a medical perspective.  This involves strict diabetes control, management of her cholesterol and hypertension and abstinence from tobacco.  In addition I would like for her to begin an exercise program.  We talked about how to start a walking program today.  I am starting her on cilostazol to see if this helps alleviate any of her symptoms.  I plan on seeing her back in 3 months for follow-up.  She knows that if she develops a nonhealing wound or infection on her foot to contact me.   Leia Alf, MD, FACS [facility] [phone] Pager [phone]

## 2022-09-07 ENCOUNTER — Other Ambulatory Visit: Payer: Self-pay | Admitting: Family Medicine

## 2022-09-09 ENCOUNTER — Ambulatory Visit: Payer: Medicare HMO | Admitting: Family Medicine

## 2022-09-10 NOTE — Telephone Encounter (Signed)
Please let patient know I am refilling this medication, but she needs to schedule an appointment with me.   Thanks, Barnell Shieh J Kirbie Stodghill, MD  

## 2022-10-02 DIAGNOSIS — H4313 Vitreous hemorrhage, bilateral: Secondary | ICD-10-CM | POA: Diagnosis not present

## 2022-10-02 DIAGNOSIS — H35373 Puckering of macula, bilateral: Secondary | ICD-10-CM | POA: Diagnosis not present

## 2022-10-02 DIAGNOSIS — H40023 Open angle with borderline findings, high risk, bilateral: Secondary | ICD-10-CM | POA: Diagnosis not present

## 2022-10-02 DIAGNOSIS — Z961 Presence of intraocular lens: Secondary | ICD-10-CM | POA: Diagnosis not present

## 2022-10-02 DIAGNOSIS — E113511 Type 2 diabetes mellitus with proliferative diabetic retinopathy with macular edema, right eye: Secondary | ICD-10-CM | POA: Diagnosis not present

## 2022-10-02 LAB — HM DIABETES EYE EXAM

## 2022-11-18 ENCOUNTER — Ambulatory Visit: Payer: Medicare HMO | Admitting: Surgery

## 2022-11-25 DIAGNOSIS — Z961 Presence of intraocular lens: Secondary | ICD-10-CM | POA: Diagnosis not present

## 2022-11-25 DIAGNOSIS — E113511 Type 2 diabetes mellitus with proliferative diabetic retinopathy with macular edema, right eye: Secondary | ICD-10-CM | POA: Diagnosis not present

## 2022-11-25 DIAGNOSIS — H35373 Puckering of macula, bilateral: Secondary | ICD-10-CM | POA: Diagnosis not present

## 2022-11-25 DIAGNOSIS — H40023 Open angle with borderline findings, high risk, bilateral: Secondary | ICD-10-CM | POA: Diagnosis not present

## 2022-11-25 DIAGNOSIS — H4313 Vitreous hemorrhage, bilateral: Secondary | ICD-10-CM | POA: Diagnosis not present

## 2022-11-30 ENCOUNTER — Other Ambulatory Visit: Payer: Self-pay | Admitting: Family Medicine

## 2022-12-12 ENCOUNTER — Telehealth: Payer: Self-pay | Admitting: Family Medicine

## 2022-12-16 ENCOUNTER — Ambulatory Visit: Payer: Medicare HMO | Admitting: Surgery

## 2022-12-16 NOTE — Telephone Encounter (Signed)
Please let patient know I am refilling this medication, but she needs to schedule an appointment with me.   Thanks, Kaylei Frink J Bilaal Leib, MD  

## 2022-12-16 NOTE — Telephone Encounter (Signed)
Called patient, and left voicemail for her to schedule an appointment.

## 2022-12-24 ENCOUNTER — Other Ambulatory Visit: Payer: Self-pay | Admitting: Family Medicine

## 2023-01-13 ENCOUNTER — Telehealth: Payer: Self-pay | Admitting: *Deleted

## 2023-01-13 NOTE — Telephone Encounter (Signed)
Needs to be seen. She is well past due for follow up with me - has canceled or no showed to her last 2 appts.  Thanks Leeanne Rio, MD

## 2023-01-13 NOTE — Telephone Encounter (Signed)
Pt reports that she has a cough / Fatigue x 2 weeks ( denies fever, SOB or other symptoms) she wants to know if Dr. Ardelia Mems will send her in a medication.  Advised that she will likely need an appt but declines appts with any other provider and states " I really do not want to come in at all".  She request that I send a message to PCP "just to ask"  Christen Bame, CMA

## 2023-01-14 NOTE — Telephone Encounter (Signed)
LMOVM informing patient. Christen Bame, CMA

## 2023-01-21 DIAGNOSIS — H401123 Primary open-angle glaucoma, left eye, severe stage: Secondary | ICD-10-CM | POA: Diagnosis not present

## 2023-01-21 DIAGNOSIS — H35373 Puckering of macula, bilateral: Secondary | ICD-10-CM | POA: Diagnosis not present

## 2023-01-21 DIAGNOSIS — Z961 Presence of intraocular lens: Secondary | ICD-10-CM | POA: Diagnosis not present

## 2023-01-21 DIAGNOSIS — H401112 Primary open-angle glaucoma, right eye, moderate stage: Secondary | ICD-10-CM | POA: Diagnosis not present

## 2023-01-21 DIAGNOSIS — E113511 Type 2 diabetes mellitus with proliferative diabetic retinopathy with macular edema, right eye: Secondary | ICD-10-CM | POA: Diagnosis not present

## 2023-01-21 DIAGNOSIS — H4313 Vitreous hemorrhage, bilateral: Secondary | ICD-10-CM | POA: Diagnosis not present

## 2023-01-27 ENCOUNTER — Other Ambulatory Visit: Payer: Self-pay

## 2023-01-27 ENCOUNTER — Encounter: Payer: Self-pay | Admitting: Family Medicine

## 2023-01-27 ENCOUNTER — Ambulatory Visit (INDEPENDENT_AMBULATORY_CARE_PROVIDER_SITE_OTHER): Payer: Medicare HMO | Admitting: Family Medicine

## 2023-01-27 VITALS — BP 134/83 | HR 81 | Ht 67.0 in | Wt 165.2 lb

## 2023-01-27 DIAGNOSIS — I739 Peripheral vascular disease, unspecified: Secondary | ICD-10-CM

## 2023-01-27 DIAGNOSIS — Z1231 Encounter for screening mammogram for malignant neoplasm of breast: Secondary | ICD-10-CM | POA: Diagnosis not present

## 2023-01-27 DIAGNOSIS — E1151 Type 2 diabetes mellitus with diabetic peripheral angiopathy without gangrene: Secondary | ICD-10-CM

## 2023-01-27 DIAGNOSIS — R5383 Other fatigue: Secondary | ICD-10-CM | POA: Diagnosis not present

## 2023-01-27 LAB — POCT GLYCOSYLATED HEMOGLOBIN (HGB A1C): HbA1c, POC (controlled diabetic range): 10.7 % — AB (ref 0.0–7.0)

## 2023-01-27 MED ORDER — INSULIN NPH (HUMAN) (ISOPHANE) 100 UNIT/ML ~~LOC~~ SUSP: SUBCUTANEOUS | 3 refills | Status: DC

## 2023-01-27 NOTE — Progress Notes (Unsigned)
  Date of Visit: 01/27/2023   SUBJECTIVE:   HPI:  Destiny Morton presents today for follow-up.  Diabetes: Currently taking NPH 53 units in the morning and 43 at night.  Reports fasting sugars are in the 130s to 140s.  Does not consistently check sugars.  A1c today is 10.7.  She reports being overwhelmed with multiple medical visits since I last saw her.  Had surgery on her eyes.  Cough: For 2 weeks reports having a constant cough.  Has vomited after coughing.  Has decreased energy.  No fever, just feels tired.  Cough is productive without blood.  She has been taking Coricidin which has helped.  Reports being around her grandchildren and thinks she may have caught something from them.  Overall this is getting better over time.  PAD: Was started on Pletal by vascular surgery.  She has not followed up with them yet.   OBJECTIVE:   BP 134/83   Pulse 81   Ht 5' 7"$  (1.702 m)   Wt 165 lb 3.2 oz (74.9 kg)   SpO2 100%   BMI 25.87 kg/m  Gen: No acute distress, pleasant, cooperative HEENT: Normocephalic, atraumatic Heart: Regular rate and rhythm, no murmur Lungs: Clear to auscultation bilaterally, normal effort.  No coughing during entirety of visit. 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  Yet again discussed at length the importance of regular follow-up in order to get diabetes under control.  I have offered repeatedly to discuss her sugars with her on the phone and adjust her insulin outside of regular office visits, but she goes months without contacting us or coming into the office.  She seems overwhelmed with all of her medical visits.  Discussed that  without getting diabetes under control, has the potential to worsen her PAD, renal function, eye issues etc.  For now we will increase her NPH to 55 units in the morning and 45 at night.  She says she will contact me by phone in a few weeks to let me know how her sugars are doing.  I hope she will follow through with this.  Peripheral artery disease ([place]) Now on Pletal, past due for follow-up with vascular surgery.  Advised to schedule appointment with them, provided information on how to schedule.  Cough Lungs are clear today.  Suspect postviral cough.  Discussed with patient this can continue for weeks.  She did not cough at all during today's visit.  As she reports feeling very fatigued, will check CBC and CMET.   FOLLOW UP: Follow up in a few weeks by phone for diabetes  [place] J. Ardelia Mems, [place]

## 2023-01-27 NOTE — Patient Instructions (Addendum)
It was great to see you again today.  Please call in 2 weeks with what your sugars are running on the increased dose of insulin (55 units in AM & 45 units in PM).  Call vascular office  [facility] [address], [place] [address] [phone]  Checking labs today - kidneys, liver, blood counts  Be well, Dr. Ardelia Mems

## 2023-01-28 LAB — CMP14+EGFR
ALT: 24 IU/L (ref 0–32)
AST: 18 IU/L (ref 0–40)
Albumin/Globulin Ratio: 1.2 (ref 1.2–2.2)
Albumin: 4 g/dL (ref 3.9–4.9)
Alkaline Phosphatase: 150 IU/L — ABNORMAL HIGH (ref 44–121)
BUN/Creatinine Ratio: 16 (ref 12–28)
BUN: 16 mg/dL (ref 8–27)
Bilirubin Total: 0.3 mg/dL (ref 0.0–1.2)
CO2: 24 mmol/L (ref 20–29)
Calcium: 9.2 mg/dL (ref 8.7–10.3)
Chloride: 101 mmol/L (ref 96–106)
Creatinine, Ser: 0.99 mg/dL (ref 0.57–1.00)
Globulin, Total: 3.3 g/dL (ref 1.5–4.5)
Glucose: 161 mg/dL — ABNORMAL HIGH (ref 70–99)
Potassium: 3.9 mmol/L (ref 3.5–5.2)
Sodium: 139 mmol/L (ref 134–144)
Total Protein: 7.3 g/dL (ref 6.0–8.5)
eGFR: 62 mL/min/{1.73_m2} (ref >59)

## 2023-01-28 LAB — CBC
Hematocrit: 40.9 % (ref 34.0–46.6)
Hemoglobin: 13.1 g/dL (ref 11.1–15.9)
MCH: 26.7 pg (ref 26.6–33.0)
MCHC: 32 g/dL (ref 31.5–35.7)
MCV: 83 fL (ref 79–97)
Platelets: 231 10*3/uL (ref 150–450)
RBC: 4.91 x10E6/uL (ref 3.77–5.28)
RDW: 12 % (ref 11.7–15.4)
WBC: 6.8 10*3/uL (ref 3.4–10.8)

## 2023-01-29 DIAGNOSIS — H31093 Other chorioretinal scars, bilateral: Secondary | ICD-10-CM | POA: Diagnosis not present

## 2023-01-29 DIAGNOSIS — H35372 Puckering of macula, left eye: Secondary | ICD-10-CM | POA: Diagnosis not present

## 2023-01-29 DIAGNOSIS — E113593 Type 2 diabetes mellitus with proliferative diabetic retinopathy without macular edema, bilateral: Secondary | ICD-10-CM | POA: Diagnosis not present

## 2023-01-29 DIAGNOSIS — H40053 Ocular hypertension, bilateral: Secondary | ICD-10-CM | POA: Diagnosis not present

## 2023-01-29 DIAGNOSIS — H26491 Other secondary cataract, right eye: Secondary | ICD-10-CM | POA: Diagnosis not present

## 2023-01-29 NOTE — Assessment & Plan Note (Signed)
Remains uncontrolled.  Yet again discussed at length the importance of regular follow-up in order to get diabetes under control.  I have offered repeatedly to discuss her sugars with her on the phone and adjust her insulin outside of regular office visits, but she goes months without contacting us or coming into the office.  She seems overwhelmed with all of her medical visits.  Discussed that without getting diabetes under control, has the potential to worsen her PAD, renal function, eye issues etc.  For now we will increase her NPH to 55 units in the morning and 45 at night.  She says she will contact me by phone in a few weeks to let me know how her sugars are doing.  I hope she will follow through with this.

## 2023-01-29 NOTE — Assessment & Plan Note (Signed)
Now on Pletal, past due for follow-up with vascular surgery.  Advised to schedule appointment with them, provided information on how to schedule.

## 2023-01-31 ENCOUNTER — Telehealth: Payer: Self-pay

## 2023-01-31 ENCOUNTER — Other Ambulatory Visit: Payer: Self-pay | Admitting: Family Medicine

## 2023-01-31 MED ORDER — INSULIN NPH (HUMAN) (ISOPHANE) 100 UNIT/ML ~~LOC~~ SUSP: SUBCUTANEOUS | 3 refills | Status: DC

## 2023-01-31 NOTE — Telephone Encounter (Signed)
Patient has been updated 

## 2023-01-31 NOTE — Telephone Encounter (Signed)
Patient calls nurse line in regards to Humulin prescription.   She reports insurance is not covering prescription.   I called pharmacy to get more clarification. Pharmacy reports her insurance will cover Novolog and not Humulin at this time.   Will forward to PCP.

## 2023-01-31 NOTE — Telephone Encounter (Signed)
Sent in rx for Novolin N. Please inform patient  Leeanne Rio, MD

## 2023-02-07 ENCOUNTER — Other Ambulatory Visit (HOSPITAL_COMMUNITY): Payer: Self-pay

## 2023-03-03 ENCOUNTER — Other Ambulatory Visit: Payer: Self-pay | Admitting: Family Medicine

## 2023-03-06 ENCOUNTER — Telehealth: Payer: Self-pay

## 2023-03-06 DIAGNOSIS — R053 Chronic cough: Secondary | ICD-10-CM

## 2023-03-06 NOTE — Telephone Encounter (Signed)
Called patient and discussed results.  Labs unremarkable except for mildly elevated alkaline phosphatase.  We will plan to check a GGT and repeat a CMET when I see her in May.  Appointment scheduled.  She is still having a cough.  Chest x-ray ordered, she was instructed go to [facility] imaging to get this done.  No specific cough medications recommended, she can continue taking Coricidin if it is helping.  She is compliant with her insulin and reports her fasting sugars have been in the 130s to 140s.  This seems an improvement over prior values.  Lowest sugar she's gotten was 90. Recheck A1c in May as scheduled.  Leeanne Rio, MD

## 2023-03-06 NOTE — Telephone Encounter (Signed)
Patient calls nurse line requesting lab results from office visit last month.   She is also reporting a cough she has had for 1.5 months. She reports this was discussed at last office visit. She denies any fevers, congestion or bloody sputum.   She reports she has been taking Coricidin, however reports she feels it is not working as well anymore. She is requesting something from PCP or if she should just continue taking Coricidin.   Will forward to PCP.

## 2023-04-09 ENCOUNTER — Ambulatory Visit (HOSPITAL_COMMUNITY)
Admission: RE | Admit: 2023-04-09 | Discharge: 2023-04-09 | Disposition: A | Discharge status: Home / Self Care | Payer: Medicare HMO | Source: Ambulatory Visit | Attending: Family Medicine | Admitting: Family Medicine

## 2023-04-09 DIAGNOSIS — R053 Chronic cough: Secondary | ICD-10-CM | POA: Insufficient documentation

## 2023-04-15 ENCOUNTER — Other Ambulatory Visit: Payer: Self-pay | Admitting: Family Medicine

## 2023-04-23 ENCOUNTER — Other Ambulatory Visit: Payer: Self-pay | Admitting: Surgery

## 2023-04-25 ENCOUNTER — Encounter: Payer: Self-pay | Admitting: Family Medicine

## 2023-04-28 ENCOUNTER — Ambulatory Visit (INDEPENDENT_AMBULATORY_CARE_PROVIDER_SITE_OTHER): Payer: Medicare HMO | Admitting: Family Medicine

## 2023-04-28 ENCOUNTER — Other Ambulatory Visit: Payer: Self-pay

## 2023-04-28 ENCOUNTER — Encounter: Payer: Self-pay | Admitting: Family Medicine

## 2023-04-28 VITALS — BP 134/74 | HR 73 | Ht 67.0 in | Wt 164.0 lb

## 2023-04-28 DIAGNOSIS — R053 Chronic cough: Secondary | ICD-10-CM | POA: Diagnosis not present

## 2023-04-28 DIAGNOSIS — E2839 Other primary ovarian failure: Secondary | ICD-10-CM

## 2023-04-28 DIAGNOSIS — I10 Essential (primary) hypertension: Secondary | ICD-10-CM | POA: Diagnosis not present

## 2023-04-28 DIAGNOSIS — E1151 Type 2 diabetes mellitus with diabetic peripheral angiopathy without gangrene: Secondary | ICD-10-CM

## 2023-04-28 LAB — POCT GLYCOSYLATED HEMOGLOBIN (HGB A1C): HbA1c, POC (controlled diabetic range): 9.9 % — AB (ref 0.0–7.0)

## 2023-04-28 MED ORDER — INSULIN NPH (HUMAN) (ISOPHANE) 100 UNIT/ML ~~LOC~~ SUSP: SUBCUTANEOUS | Status: DC

## 2023-04-28 MED ORDER — OMEPRAZOLE 20 MG PO CPDR: 20.0000 mg | DELAYED_RELEASE_CAPSULE | Freq: Every day | ORAL | 3 refills | Status: DC

## 2023-04-28 NOTE — Assessment & Plan Note (Addendum)
Patient has had a cough for 3 months. CXR on 04/09/23 was negative. Lungs are clear today. Originally believed to be post viral URI cough but has not resolved. Given that the cough is worse at night when the patient lays down, this could be from GERD. Starting omeprazole to see if symptoms improve. Follow up in 3 months.

## 2023-04-28 NOTE — Assessment & Plan Note (Signed)
Taking Norvasc 10 mg and lopressor 50 mg. BP is well controlled to 134/74. Continue current regimen and follow up in 3 months.

## 2023-04-28 NOTE — Patient Instructions (Addendum)
It was great to see you again today.  Increase insulin to 58units in morning, continue 45 units in evening  Try omeprazole 20mg  daily - sent this in to your pharmacy. This is for acid reflux to help treat the nighttime cough.  Follow up with me in 3 months, sooner if needed.  Be well, Dr. Pollie Meyer

## 2023-04-28 NOTE — Progress Notes (Unsigned)
    SUBJECTIVE:   CHIEF COMPLAINT / HPI:   Destiny Morton is a 67 yo female who present to the clinic for follow up on diabetes and HTN. She also complains of an ongoing cough.  Cough Cough has been present for 3 months now. It is worse when she lays down at night. Sometimes will cough up clear mucus. She denies shortness of breath or chest pain or fevers. Has taken coricidin and says that helps. Had a CXR on 04/09/23 which was normal.  HTN BP today was 134/74 on Amlodipine 10 mg daily and Lopressor 50 mg bid.  DM2 Compliant with meds on Novolin N 55 am and 45 at night. A1c today was 9.9, which is the lowest recorded. She stopped drinking juice or eating sweets. At home sugars: in mornings usually around 100 but no higher than 130, very rare/occasional lows in 60s. At night before bed is 200s.   OBJECTIVE:   BP 134/74   Pulse 73   Ht 5\' 7"  (1.702 m)   Wt 164 lb (74.4 kg)   SpO2 100%   BMI 25.69 kg/m    Gen: alert, well appearing, in no acute distress HEENT: normocephalic, atraumatic CV: RRR, no MRG Pulm: normal WOB, clear to auscultation bilaterally Ext: no lower extremity edema  ASSESSMENT/PLAN:   Type 2 diabetes mellitus with peripheral artery disease (HCC) Was taking NPH 55 u in the morning and 45 units at night. Her sugars are in 200s at night and usually ~100 in the mornings. A1c is down to 9.9. Increasing morning NPH to 58 units and stay at 45 units at night. Continue to avoid sugary drinks and foods. Follow up in 3 months.  HYPERTENSION, BENIGN SYSTEMIC Taking Norvasc 10 mg and lopressor 50 mg. BP is well controlled to 134/74. Continue current regimen and follow up in 3 months.  Chronic cough Patient has had a cough for 3 months. CXR on 04/09/23 was negative. Lungs are clear today. Originally believed to be post viral URI cough but has not resolved. Given that the cough is worse at night when the patient lays down, this could be from GERD. Starting omeprazole to see if  symptoms improve. Follow up in 3 months.   Health Maintenance -Patient agreed to schedule her mammogram and DEXA scan, ordered, given phone # to schedule -Discussed she is due for colonoscopy, however patient prefers to discuss this more next visit  Elevated alkaline phosphatase In February alkaline phosphatase was 150.  Had recommended we recheck it along with GGT at this visit.  Patient strongly prefers not to recheck it right now as she is worried about feeling stressed by it.  Given it was only mildly elevated, I think this is reasonable.  Will continue to readdress with patient in the future.  Barrett Shell, Medical Student [facility]   Patient seen along with medical student Will Dabbs. I personally evaluated this patient along with the student, and verified all aspects of the history, physical exam, and medical decision making as documented by the student. I agree with the student's documentation and have made all necessary edits.  Levert Feinstein, MD  [facility]

## 2023-04-28 NOTE — Assessment & Plan Note (Addendum)
Was taking NPH 55 u in the morning and 45 units at night. Her sugars are in 200s at night and usually ~100 in the mornings. A1c is down to 9.9. Increasing morning NPH to 58 units and stay at 45 units at night. Continue to avoid sugary drinks and foods. Follow up in 3 months.

## 2023-04-29 NOTE — Progress Notes (Unsigned)
  Date of Visit: 04/28/2023   SUBJECTIVE:   HPI:  Cookie presents today for ***   OBJECTIVE:   BP 134/74   Pulse 73   Ht 5\' 7"  (1.702 m)   Wt 164 lb (74.4 kg)   SpO2 100%   BMI 25.69 kg/m  Gen: *** HEENT: *** Heart: *** Lungs: *** Neuro: *** Ext: ***  ASSESSMENT/PLAN:   Health maintenance:  -***       FOLLOW UP: Follow up in *** for ***  [place] J. Pollie Meyer, MD [facility]

## 2023-05-15 ENCOUNTER — Other Ambulatory Visit: Payer: Self-pay | Admitting: Family Medicine

## 2023-06-09 ENCOUNTER — Other Ambulatory Visit: Payer: Self-pay | Admitting: Family Medicine

## 2023-07-15 ENCOUNTER — Other Ambulatory Visit: Payer: Self-pay | Admitting: Surgery

## 2023-07-25 ENCOUNTER — Other Ambulatory Visit: Payer: Self-pay | Admitting: Family Medicine

## 2023-08-02 ENCOUNTER — Other Ambulatory Visit: Payer: Self-pay | Admitting: Family Medicine

## 2023-08-06 DIAGNOSIS — H26491 Other secondary cataract, right eye: Secondary | ICD-10-CM | POA: Diagnosis not present

## 2023-08-06 DIAGNOSIS — H31093 Other chorioretinal scars, bilateral: Secondary | ICD-10-CM | POA: Diagnosis not present

## 2023-08-06 DIAGNOSIS — H35372 Puckering of macula, left eye: Secondary | ICD-10-CM | POA: Diagnosis not present

## 2023-08-06 DIAGNOSIS — H40053 Ocular hypertension, bilateral: Secondary | ICD-10-CM | POA: Diagnosis not present

## 2023-08-06 DIAGNOSIS — E113593 Type 2 diabetes mellitus with proliferative diabetic retinopathy without macular edema, bilateral: Secondary | ICD-10-CM | POA: Diagnosis not present

## 2023-08-06 LAB — HM DIABETES EYE EXAM

## 2023-08-08 ENCOUNTER — Ambulatory Visit: Payer: Medicare HMO

## 2023-08-26 ENCOUNTER — Other Ambulatory Visit: Payer: Self-pay

## 2023-08-26 MED ORDER — CILOSTAZOL 100 MG PO TABS: 100.0000 mg | ORAL_TABLET | Freq: Two times a day (BID) | ORAL | 5 refills | Status: AC

## 2023-09-02 DIAGNOSIS — H4313 Vitreous hemorrhage, bilateral: Secondary | ICD-10-CM | POA: Diagnosis not present

## 2023-09-02 DIAGNOSIS — H401123 Primary open-angle glaucoma, left eye, severe stage: Secondary | ICD-10-CM | POA: Diagnosis not present

## 2023-09-02 DIAGNOSIS — E113511 Type 2 diabetes mellitus with proliferative diabetic retinopathy with macular edema, right eye: Secondary | ICD-10-CM | POA: Diagnosis not present

## 2023-09-02 DIAGNOSIS — H35373 Puckering of macula, bilateral: Secondary | ICD-10-CM | POA: Diagnosis not present

## 2023-09-02 DIAGNOSIS — Z961 Presence of intraocular lens: Secondary | ICD-10-CM | POA: Diagnosis not present

## 2023-09-02 DIAGNOSIS — H26491 Other secondary cataract, right eye: Secondary | ICD-10-CM | POA: Diagnosis not present

## 2023-09-02 DIAGNOSIS — H35033 Hypertensive retinopathy, bilateral: Secondary | ICD-10-CM | POA: Diagnosis not present

## 2023-09-02 DIAGNOSIS — H401112 Primary open-angle glaucoma, right eye, moderate stage: Secondary | ICD-10-CM | POA: Diagnosis not present

## 2023-09-17 ENCOUNTER — Other Ambulatory Visit: Payer: Self-pay | Admitting: Family Medicine

## 2023-09-19 ENCOUNTER — Encounter: Payer: Self-pay | Admitting: Family Medicine

## 2023-09-19 ENCOUNTER — Ambulatory Visit: Payer: Medicare HMO

## 2023-09-19 NOTE — Telephone Encounter (Signed)
Please let patient know I am refilling her diabetes supplies, but she needs to schedule an appointment with me.   Thanks, [name], MD

## 2023-09-25 ENCOUNTER — Other Ambulatory Visit: Payer: Self-pay | Admitting: Family Medicine

## 2023-09-26 NOTE — Telephone Encounter (Signed)
Called and lvm for patient to call back and schedule appointment.   Thanks [employer]

## 2023-10-03 ENCOUNTER — Ambulatory Visit: Payer: Medicare HMO

## 2023-10-03 VITALS — Ht 67.0 in | Wt 164.0 lb

## 2023-10-03 DIAGNOSIS — Z0000 Encounter for general adult medical examination without abnormal findings: Secondary | ICD-10-CM

## 2023-10-03 NOTE — Progress Notes (Signed)
Subjective:   Destiny Morton is a 67 y.o. female who presents for an Initial Medicare Annual Wellness Visit.  Visit Complete: Virtual I connected with  [name] on 10/03/23 by a audio enabled telemedicine application and verified that I am speaking with the correct person using two identifiers.  Patient Location: Home  Provider Location: Home Office  I discussed the limitations of evaluation and management by telemedicine. The patient expressed understanding and agreed to proceed.  Vital Signs: Because this visit was a virtual/telehealth visit, some criteria may be missing or patient reported. Any vitals not documented were not able to be obtained and vitals that have been documented are patient reported.  Cardiac Risk Factors include: advanced age (>39men, >40 women);diabetes mellitus;dyslipidemia;hypertension;sedentary lifestyle     Objective:    Today's Vitals   10/03/23 1515  Weight: 164 lb (74.4 kg)  Height: 5\' 7"  (1.702 m)   Body mass index is 25.69 kg/m.     10/03/2023    3:32 PM 04/28/2023   10:47 AM 01/27/2023   11:13 AM 07/25/2022    1:26 PM 07/04/2022   10:50 AM 08/07/2021   10:00 AM 03/03/2018   10:00 AM  Advanced Directives  Does Patient Have a Medical Advance Directive? No No No No No No No  Would patient like information on creating a medical advance directive? Yes (MAU/Ambulatory/Procedural Areas - Information given) No - Patient declined No - Patient declined No - Patient declined  No - Patient declined No - Patient declined    Current Medications (verified) Outpatient Encounter Medications as of 10/03/2023  Medication Sig   ACCU-CHEK GUIDE test strip USE AS INSTRUCTED   Accu-Chek Softclix Lancets lancets USE AS INSTRUCTED   amLODipine (NORVASC) 10 MG tablet TAKE 1 TABLET BY MOUTH EVERY DAY   aspirin 81 MG EC tablet Take 81 mg by mouth daily.   b complex vitamins tablet Take 1 tablet by mouth daily.   B-D INS SYR ULTRAFINE 1CC/30G 30G X 1/2" 1  ML MISC USE AS DIRECTED TWICE A DAY   Cholecalciferol (VITAMIN D3) 25 MCG (1000 UT) CAPS Take 2,000 Units by mouth daily.   cilostazol (PLETAL) 100 MG tablet Take 1 tablet (100 mg total) by mouth 2 (two) times daily before a meal.   clotrimazole (LOTRIMIN) 1 % cream APPLY TOPICALLY 2 (TWO) TIMES DAILY. BETWEEN TOES   dorzolamide-timolol (COSOPT) 2-0.5 % ophthalmic solution 1 drop 2 (two) times daily.   fish oil-omega-3 fatty acids 1000 MG capsule Take 1 g by mouth daily.   insulin NPH Human (NOVOLIN N) 100 UNIT/ML injection INJECT 58 UNITS UNDER THE SKIN IN THE MORNING AND 45 UNITS IN THE EVENING BEFORE MEAL   MAGNESIUM PO Take 1 tablet by mouth daily.   metoprolol tartrate (LOPRESSOR) 50 MG tablet TAKE 1 TABLET BY MOUTH TWICE A DAY (Patient taking differently: Take 50 mg by mouth daily. TAKE 1 TABLET BY MOUTH TWICE A DAY)   Multiple Vitamin (MULTIVITAMIN WITH MINERALS) TABS Take 1 tablet by mouth daily.   omeprazole (PRILOSEC) 20 MG capsule TAKE 1 CAPSULE BY MOUTH EVERY DAY   polyethylene glycol powder (MIRALAX) 17 GM/SCOOP powder Take 17 g by mouth daily as needed.   rosuvastatin (CRESTOR) 40 MG tablet TAKE 1 TABLET BY MOUTH EVERY DAY   No facility-administered encounter medications on file as of 10/03/2023.    Allergies (verified) Metformin and related, Losartan potassium-hctz, Hydrochlorothiazide, and Valsartan   History: Past Medical History:  Diagnosis Date   Diabetes  mellitus    Hypertension    Past Surgical History:  Procedure Laterality Date   EYE SURGERY     History reviewed. No pertinent family history. Social History   Socioeconomic History   Marital status: Married    Spouse name: Not on file   Number of children: Not on file   Years of education: Not on file   Highest education level: Not on file  Occupational History   Not on file  Tobacco Use   Smoking status: Never   Smokeless tobacco: Never  Substance and Sexual Activity   Alcohol use: No   Drug use: No    Sexual activity: Not on file  Other Topics Concern   Not on file  Social History Narrative   Not on file   Social Determinants of Health   Financial Resource Strain: Low Risk  (10/03/2023)   Overall Financial Resource Strain (CARDIA)    Difficulty of Paying Living Expenses: Not hard at all  Food Insecurity: No Food Insecurity (10/03/2023)   Hunger Vital Sign    Worried About Running Out of Food in the Last Year: Never true    Ran Out of Food in the Last Year: Never true  Transportation Needs: No Transportation Needs (10/03/2023)   PRAPARE - Administrator, Civil Service (Medical): No    Lack of Transportation (Non-Medical): No  Physical Activity: Insufficiently Active (10/03/2023)   Exercise Vital Sign    Days of Exercise per Week: 3 days    Minutes of Exercise per Session: 30 min  Stress: No Stress Concern Present (10/03/2023)   [employer] of Occupational Health - Occupational Stress Questionnaire    Feeling of Stress : Not at all  Social Connections: Moderately Integrated (10/03/2023)   Social Connection and Isolation Panel [NHANES]    Frequency of Communication with Friends and Family: More than three times a week    Frequency of Social Gatherings with Friends and Family: Three times a week    Attends Religious Services: More than 4 times per year    Active Member of Clubs or Organizations: No    Attends Banker Meetings: Never    Marital Status: Married    Tobacco Counseling Counseling given: Not Answered   Clinical Intake:  Pre-visit preparation completed: Yes  Pain : No/denies pain     Diabetes: Yes CBG done?: No Did pt. bring in CBG monitor from home?: No  How often do you need to have someone help you when you read instructions, pamphlets, or other written materials from your doctor or pharmacy?: 1 - Never  Interpreter Needed?: No  Information entered by :: Kandis Fantasia LPN   Activities of Daily Living     10/03/2023    3:32 PM  In your present state of health, do you have any difficulty performing the following activities:  Hearing? 0  Vision? 0  Difficulty concentrating or making decisions? 0  Walking or climbing stairs? 0  Dressing or bathing? 0  Doing errands, shopping? 0  Preparing Food and eating ? N  Using the Toilet? N  In the past six months, have you accidently leaked urine? N  Do you have problems with loss of bowel control? N  Managing your Medications? N  Managing your Finances? N  Housekeeping or managing your Housekeeping? N    Patient Care Team: [name], MD as PCP - General (Family Medicine)  Indicate any recent Medical Services you may have received from other than  [facility] providers in the past year (date may be approximate).     Assessment:   This is a routine wellness examination for Destiny Morton.  Hearing/Vision screen Hearing Screening - Comments:: Denies hearing difficulties   Vision Screening - Comments:: Wears rx glasses - up to date with routine eye exams with Dr. Allyne Gee and Dr. Dione Booze     Goals Addressed             This Visit's Progress    Increase activity and remain independent        Depression Screen    10/03/2023    3:19 PM 04/28/2023   10:47 AM 07/25/2022    1:26 PM 08/07/2021   10:00 AM 05/08/2021   11:08 AM 07/06/2020   10:05 AM 03/03/2018   10:00 AM  PHQ 2/9 Scores  PHQ - 2 Score 4 5 0 2 0 0 0  PHQ- 9 Score 8 15 0 3 0 1     Fall Risk    10/03/2023    3:31 PM 04/28/2023   10:47 AM 01/27/2023   11:13 AM 07/25/2022    1:26 PM 07/04/2022   10:50 AM  Fall Risk   Falls in the past year? 0 0 0 0 0  Number falls in past yr: 0 0 0 0   Injury with Fall? 0 0 0 0   Risk for fall due to : No Fall Risks      Follow up Falls prevention discussed;Education provided;Falls evaluation completed   Falls evaluation completed     MEDICARE RISK AT HOME: Medicare Risk at Home Any stairs in or around the home?: No If so, are there any  without handrails?: No Home free of loose throw rugs in walkways, pet beds, electrical cords, etc?: Yes Adequate lighting in your home to reduce risk of falls?: Yes Life alert?: No Use of a cane, walker or w/c?: No Grab bars in the bathroom?: Yes Shower chair or bench in shower?: No Elevated toilet seat or a handicapped toilet?: Yes  TIMED UP AND GO:  Was the test performed? No    Cognitive Function:        10/03/2023    3:32 PM  6CIT Screen  What Year? 0 points  What month? 0 points  What time? 0 points  Count back from 20 0 points  Months in reverse 0 points  Repeat phrase 0 points  Total Score 0 points    Immunizations Immunization History  Administered Date(s) Administered   PPD Test 12/14/2012   Td 04/08/1993    TDAP status: Due, Education has been provided regarding the importance of this vaccine. Advised may receive this vaccine at local pharmacy or Health Dept. Aware to provide a copy of the vaccination record if obtained from local pharmacy or Health Dept. Verbalized acceptance and understanding.  Flu Vaccine status: Declined, Education has been provided regarding the importance of this vaccine but patient still declined. Advised may receive this vaccine at local pharmacy or Health Dept. Aware to provide a copy of the vaccination record if obtained from local pharmacy or Health Dept. Verbalized acceptance and understanding.  Pneumococcal vaccine status: Declined,  Education has been provided regarding the importance of this vaccine but patient still declined. Advised may receive this vaccine at local pharmacy or Health Dept. Aware to provide a copy of the vaccination record if obtained from local pharmacy or Health Dept. Verbalized acceptance and understanding.   Covid-19 vaccine status: Declined, Education has been provided regarding the importance  of this vaccine but patient still declined. Advised may receive this vaccine at local pharmacy or Health Dept.or  vaccine clinic. Aware to provide a copy of the vaccination record if obtained from local pharmacy or Health Dept. Verbalized acceptance and understanding.  Qualifies for Shingles Vaccine? Yes   Zostavax completed No   Shingrix Completed?: No.    Education has been provided regarding the importance of this vaccine. Patient has been advised to call insurance company to determine out of pocket expense if they have not yet received this vaccine. Advised may also receive vaccine at local pharmacy or Health Dept. Verbalized acceptance and understanding.  Screening Tests Health Maintenance  Topic Date Due   DTaP/Tdap/Td (2 - Tdap) 04/09/2003   MAMMOGRAM  Never done   Zoster Vaccines- Shingrix (1 of 2) Never done   Pneumonia Vaccine 43+ Years old (1 of 1 - PCV) Never done   DEXA SCAN  Never done   FOOT EXAM  07/06/2021   Colonoscopy  04/17/2023   Diabetic kidney evaluation - Urine ACR  07/05/2023   COVID-19 Vaccine (1 - 2023-24 season) Never done   INFLUENZA VACCINE  03/08/2024 (Originally 07/10/2023)   HEMOGLOBIN A1C  10/29/2023   Diabetic kidney evaluation - eGFR measurement  01/28/2024   OPHTHALMOLOGY EXAM  08/05/2024   Medicare Annual Wellness (AWV)  10/02/2024   Hepatitis C Screening  Completed   HPV VACCINES  Aged Out    Health Maintenance  Health Maintenance Due  Topic Date Due   DTaP/Tdap/Td (2 - Tdap) 04/09/2003   MAMMOGRAM  Never done   Zoster Vaccines- Shingrix (1 of 2) Never done   Pneumonia Vaccine 85+ Years old (1 of 1 - PCV) Never done   DEXA SCAN  Never done   FOOT EXAM  07/06/2021   Colonoscopy  04/17/2023   Diabetic kidney evaluation - Urine ACR  07/05/2023   COVID-19 Vaccine (1 - 2023-24 season) Never done    Colorectal cancer screening:  Patient declines at this time   Mammogram status: Ordered  . Pt provided with contact info and advised to call to schedule appt.   Bone Density status: Ordered  . Pt provided with contact info and advised to call to schedule  appt.  Lung Cancer Screening: (Low Dose CT Chest recommended if Age 71-80 years, 20 pack-year currently smoking OR have quit w/in 15years.) does not qualify.   Lung Cancer Screening Referral: n/a  Additional Screening:  Hepatitis C Screening: does qualify; Completed 12/17/16  Vision Screening: Recommended annual ophthalmology exams for early detection of glaucoma and other disorders of the eye. Is the patient up to date with their annual eye exam?  Yes  Who is the provider or what is the name of the office in which the patient attends annual eye exams? Dr. Dione Booze and Dr. Allyne Gee  If pt is not established with a provider, would they like to be referred to a provider to establish care? No .   Dental Screening: Recommended annual dental exams for proper oral hygiene  Diabetic Foot Exam: Diabetic Foot Exam: Overdue, Pt has been advised about the importance in completing this exam. Pt is scheduled for diabetic foot exam on at next office visit.  Community Resource Referral / Chronic Care Management: CRR required this visit?  No   CCM required this visit?  No     Plan:     I have personally reviewed and noted the following in the patient's chart:   Medical and social history Use  of alcohol, tobacco or illicit drugs  Current medications and supplements including opioid prescriptions. Patient is not currently taking opioid prescriptions. Functional ability and status Nutritional status Physical activity Advanced directives List of other physicians Hospitalizations, surgeries, and ER visits in previous 12 months Vitals Screenings to include cognitive, depression, and falls Referrals and appointments  In addition, I have reviewed and discussed with patient certain preventive protocols, quality metrics, and best practice recommendations. A written personalized care plan for preventive services as well as general preventive health recommendations were provided to patient.     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

## 2023-10-03 NOTE — Patient Instructions (Signed)
Destiny Morton , Thank you for taking time to come for your Medicare Wellness Visit. I appreciate your ongoing commitment to your health goals. Please review the following plan we discussed and let me know if I can assist you in the future.   Referrals/Orders/Follow-Ups/Clinician Recommendations: Vaccinations: Aim for 30 minutes of exercise or brisk walking, 6-8 glasses of water, and 5 servings of fruits and vegetables each day.  You have an order for:  []   2D Mammogram  [x]   3D Mammogram  []   Bone Density     Please call for appointment:   [facility] [address] [place], [place] [address] [phone]    Make sure to wear two-piece clothing.  No lotions, powders, or deodorants the day of the appointment. Make sure to bring picture ID and insurance card.  Bring list of medications you are currently taking including any supplements.   Schedule your Angus screening mammogram through MyChart!   Log into your MyChart account.  Go to 'Visit' (or 'Appointments' if on mobile App) --> Schedule an Appointment  Under 'Select a Reason for Visit' choose the Mammogram Screening option.  Complete the pre-visit questions and select the time and place that best fits your schedule.    This is a list of the screening recommended for you and due dates:  Health Maintenance  Topic Date Due   DTaP/Tdap/Td vaccine (2 - Tdap) 04/09/2003   Mammogram  Never done   Zoster (Shingles) Vaccine (1 of 2) Never done   Pneumonia Vaccine (1 of 1 - PCV) Never done   DEXA scan (bone density measurement)  Never done   Complete foot exam   07/06/2021   Colon Cancer Screening  04/17/2023   Yearly kidney health urinalysis for diabetes  07/05/2023   COVID-19 Vaccine (1 - 2023-24 season) Never done   Flu Shot  03/08/2024*   Hemoglobin A1C  10/29/2023   Yearly kidney function blood test for diabetes  01/28/2024   Eye exam for diabetics  08/05/2024   Medicare Annual Wellness  Visit  10/02/2024   Hepatitis C Screening  Completed   HPV Vaccine  Aged Out  *Topic was postponed. The date shown is not the original due date.    Advanced directives: (ACP Link)Information on Advanced Care Planning can be found at [facility] Advance Health Care Directives Advance Health Care Directives ([email or web address]   Next Medicare Annual Wellness Visit scheduled for next year: Yes

## 2023-10-27 DIAGNOSIS — Z961 Presence of intraocular lens: Secondary | ICD-10-CM | POA: Diagnosis not present

## 2023-10-27 DIAGNOSIS — H401133 Primary open-angle glaucoma, bilateral, severe stage: Secondary | ICD-10-CM | POA: Diagnosis not present

## 2023-12-23 ENCOUNTER — Other Ambulatory Visit: Payer: Self-pay | Admitting: Family Medicine

## 2023-12-24 ENCOUNTER — Other Ambulatory Visit: Payer: Self-pay | Admitting: Family Medicine

## 2023-12-24 NOTE — Telephone Encounter (Signed)
Please let patient know I am refilling this medication, but she needs to schedule an appointment with me.   Thanks, Leeanne Rio, MD

## 2024-01-05 ENCOUNTER — Other Ambulatory Visit: Payer: Self-pay | Admitting: Family Medicine

## 2024-01-13 MED ORDER — ACCU-CHEK GUIDE TEST VI STRP: ORAL_STRIP | 12 refills | Status: AC

## 2024-01-13 NOTE — Telephone Encounter (Signed)
Patient calls nurse line regarding glucometer testing strips.   Rx from 01/07/24, transmission failed to pharmacy.   Resent this morning.   Veronda Prude, RN

## 2024-01-13 NOTE — Addendum Note (Signed)
Addended by: Veronda Prude on: 01/13/2024 11:07 AM   Modules accepted: Orders

## 2024-01-22 ENCOUNTER — Ambulatory Visit: Payer: Medicare HMO | Admitting: Family Medicine

## 2024-01-28 ENCOUNTER — Other Ambulatory Visit: Payer: Self-pay | Admitting: Family Medicine

## 2024-01-29 ENCOUNTER — Telehealth: Payer: Self-pay | Admitting: *Deleted

## 2024-01-29 NOTE — Telephone Encounter (Signed)
 Patient was identified as falling into the [place] Measure - Diabetes.   Patient was: Left voicemail to schedule with primary care provider.

## 2024-02-02 NOTE — Telephone Encounter (Signed)
Please let patient know I am refilling this medication, but she needs to schedule an appointment with me.   Thanks, Leeanne Rio, MD

## 2024-02-13 ENCOUNTER — Ambulatory Visit (INDEPENDENT_AMBULATORY_CARE_PROVIDER_SITE_OTHER): Payer: Medicare HMO | Admitting: Family Medicine

## 2024-02-13 VITALS — BP 159/84 | HR 73 | Ht 67.0 in | Wt 170.4 lb

## 2024-02-13 DIAGNOSIS — L910 Hypertrophic scar: Secondary | ICD-10-CM | POA: Diagnosis not present

## 2024-02-13 DIAGNOSIS — I10 Essential (primary) hypertension: Secondary | ICD-10-CM

## 2024-02-13 DIAGNOSIS — E1151 Type 2 diabetes mellitus with diabetic peripheral angiopathy without gangrene: Secondary | ICD-10-CM | POA: Diagnosis not present

## 2024-02-13 DIAGNOSIS — Z1231 Encounter for screening mammogram for malignant neoplasm of breast: Secondary | ICD-10-CM | POA: Diagnosis not present

## 2024-02-13 DIAGNOSIS — Z0000 Encounter for general adult medical examination without abnormal findings: Secondary | ICD-10-CM | POA: Diagnosis not present

## 2024-02-13 DIAGNOSIS — E785 Hyperlipidemia, unspecified: Secondary | ICD-10-CM | POA: Diagnosis not present

## 2024-02-13 DIAGNOSIS — R748 Abnormal levels of other serum enzymes: Secondary | ICD-10-CM | POA: Diagnosis not present

## 2024-02-13 DIAGNOSIS — R5383 Other fatigue: Secondary | ICD-10-CM

## 2024-02-13 LAB — POCT GLYCOSYLATED HEMOGLOBIN (HGB A1C): HbA1c, POC (controlled diabetic range): 10.4 % — AB (ref 0.0–7.0)

## 2024-02-13 MED ORDER — INSULIN NPH (HUMAN) (ISOPHANE) 100 UNIT/ML ~~LOC~~ SUSP
SUBCUTANEOUS | Status: DC
Start: 2024-02-13 — End: 2024-03-24

## 2024-02-13 MED ORDER — CLOTRIMAZOLE 1 % EX CREA: TOPICAL_CREAM | Freq: Two times a day (BID) | CUTANEOUS | 1 refills | Status: AC

## 2024-02-13 NOTE — Patient Instructions (Signed)
 It was great to see you again today.  Refilled clotrimazole Checking labs today Change insulin to 60 units in AM and 40 units in PM Schedule in dermatology procedures clinic  To schedule your mammogram, call the [facility] at [phone]. They are located at [address].        Be well, Dr. Pollie Meyer

## 2024-02-13 NOTE — Progress Notes (Signed)
  Date of Visit: 02/13/2024   SUBJECTIVE:   HPI:  Amazing presents today for follow-up.  This is her first visit since May 2024.  Diabetes: Currently taking insulin NPH 58 units in the morning and 45 units in the evening.  Reports fasting sugars are running low, often in 60s to 70s.  Sugars get up to 130-140 before lunch.  A1c today is 10.4.  Keloid: Present on right aspect of chest wall, midaxillary line.  Previously had triamcinolone injection which helped.  She would like to have this done again.  Fatigue: Feels decreased energy for about a month.  No fever or congestion.  Did have a cough which went away.  Requests refill of clotrimazole  OBJECTIVE:   BP (!) 159/84   Pulse 73   Ht 5\' 7"  (1.702 m)   Wt 170 lb 6.4 oz (77.3 kg)   SpO2 100%   BMI 26.69 kg/m  Gen: No acute distress, pleasant, cooperative HEENT: Normocephalic, atraumatic.  No lymphadenopathy present Heart: Regular rate and rhythm, no murmur Lungs: Clear bilaterally, normal effort Neuro: Grossly nonfocal, speech normal Skin: Keloid present along right chest wall mid axillary line  ASSESSMENT/PLAN:   Assessment & Plan Type 2 diabetes mellitus with peripheral artery disease (HCC) Remains uncontrolled with A1c of 10.4.  Again discussed importance of regular follow-up.  We have had this discussion many, many times, that she needs to be seen at least every 3 months until her diabetes is under better control in order to prevent life, limb, and organ damage resulting from uncontrolled diabetes.  Unfortunately, she seems very overwhelmed and gets fairly defensive, stating she has many appointments to attend.  She estimates she attends 4-5 appointments per month.  Attempted to brainstorm solutions with patient today: - Offered to set her up with MyChart so she can message me but she says she does not trust the Internet - Advised she can leave a message with nursing staff about her sugars and I will call her back so we can  titrate her insulin.   - Offered her follow-up with Dr. Raymondo Band but she does not want to be seen in person for additional appointments  For now, elected to adjust insulin such that she is taking less in the evening and more in the morning, with the goal of lessening her fasting hypoglycemia.  Will do 60 units in the morning and 40 in the evening.  Hopefully she will call me so we can adjust her insulin further. Other fatigue Nonspecific symptoms.  Check labs today: CBC, TSH, CMP Elevated alkaline phosphatase level Update CMP and GGT today. Keloid Advised to schedule in dermatology procedures clinic Routine adult health maintenance Past due for many health maintenance items, difficult to address health maintenance and all her many chronic medical issues when she follows up so sparingly and also wants to address acute complaints in her visits.  Patient gets overwhelmed very easily when I suggest the need for any further testing or appointments, so I attempted to address health maintenance gently. - Discussed importance of mammogram.  She has never had a mammogram, this has been ordered and she was given instructions on how to schedule. - UACR today HYPERTENSION, BENIGN SYSTEMIC BP elevated, patient had only taken medication at home just before her appointment.  Reports better numbers at home.  No changes today.    [place] J. Pollie Meyer, MD [facility]

## 2024-02-14 LAB — LIPID PANEL
Chol/HDL Ratio: 5.2 ratio — ABNORMAL HIGH (ref 0.0–4.4)
Cholesterol, Total: 186 mg/dL (ref 100–199)
HDL: 36 mg/dL — ABNORMAL LOW
LDL Chol Calc (NIH): 132 mg/dL — ABNORMAL HIGH (ref 0–99)
Triglycerides: 96 mg/dL (ref 0–149)
VLDL Cholesterol Cal: 18 mg/dL (ref 5–40)

## 2024-02-14 LAB — CMP14+EGFR
ALT: 22 IU/L (ref 0–32)
AST: 18 IU/L (ref 0–40)
Albumin: 4.4 g/dL (ref 3.9–4.9)
Alkaline Phosphatase: 151 IU/L — ABNORMAL HIGH (ref 44–121)
BUN/Creatinine Ratio: 13 (ref 12–28)
BUN: 12 mg/dL (ref 8–27)
Bilirubin Total: 0.4 mg/dL (ref 0.0–1.2)
CO2: 23 mmol/L (ref 20–29)
Calcium: 9.8 mg/dL (ref 8.7–10.3)
Chloride: 103 mmol/L (ref 96–106)
Creatinine, Ser: 0.96 mg/dL (ref 0.57–1.00)
Globulin, Total: 3.5 g/dL (ref 1.5–4.5)
Glucose: 71 mg/dL (ref 70–99)
Potassium: 3.6 mmol/L (ref 3.5–5.2)
Sodium: 142 mmol/L (ref 134–144)
Total Protein: 7.9 g/dL (ref 6.0–8.5)
eGFR: 64 mL/min/{1.73_m2} (ref >59)

## 2024-02-14 LAB — CBC WITH DIFFERENTIAL/PLATELET
Basophils Absolute: 0 10*3/uL (ref 0.0–0.2)
Basos: 1 %
EOS (ABSOLUTE): 0.1 10*3/uL (ref 0.0–0.4)
Eos: 1 %
Hematocrit: 42 % (ref 34.0–46.6)
Hemoglobin: 13.8 g/dL (ref 11.1–15.9)
Immature Grans (Abs): 0 10*3/uL (ref 0.0–0.1)
Immature Granulocytes: 0 %
Lymphocytes Absolute: 3.6 10*3/uL — ABNORMAL HIGH (ref 0.7–3.1)
Lymphs: 44 %
MCH: 27.4 pg (ref 26.6–33.0)
MCHC: 32.9 g/dL (ref 31.5–35.7)
MCV: 84 fL (ref 79–97)
Monocytes Absolute: 0.9 10*3/uL (ref 0.1–0.9)
Monocytes: 11 %
Neutrophils Absolute: 3.5 10*3/uL (ref 1.4–7.0)
Neutrophils: 43 %
Platelets: 250 10*3/uL (ref 150–450)
RBC: 5.03 x10E6/uL (ref 3.77–5.28)
RDW: 12.8 % (ref 11.7–15.4)
WBC: 8.2 10*3/uL (ref 3.4–10.8)

## 2024-02-14 LAB — GAMMA GT: GGT: 46 IU/L (ref 0–60)

## 2024-02-14 LAB — TSH: TSH: 2.53 u[IU]/mL (ref 0.450–4.500)

## 2024-02-15 DIAGNOSIS — Z0000 Encounter for general adult medical examination without abnormal findings: Secondary | ICD-10-CM | POA: Insufficient documentation

## 2024-02-15 LAB — MICROALBUMIN / CREATININE URINE RATIO
Creatinine, Urine: 53.2 mg/dL
Microalb/Creat Ratio: 24 mg/g{creat} (ref 0–29)
Microalbumin, Urine: 13 ug/mL

## 2024-02-15 NOTE — Assessment & Plan Note (Signed)
 Advised to schedule in dermatology procedures clinic

## 2024-02-15 NOTE — Assessment & Plan Note (Signed)
 Past due for many health maintenance items, difficult to address health maintenance and all her many chronic medical issues when she follows up so sparingly and also wants to address acute complaints in her visits.  Patient gets overwhelmed very easily when I suggest the need for any further testing or appointments, so I attempted to address health maintenance gently. - Discussed importance of mammogram.  She has never had a mammogram, this has been ordered and she was given instructions on how to schedule. - UACR today

## 2024-02-15 NOTE — Assessment & Plan Note (Signed)
 Nonspecific symptoms.  Check labs today: CBC, TSH, CMP

## 2024-02-15 NOTE — Assessment & Plan Note (Signed)
 Remains uncontrolled with A1c of 10.4.  Again discussed importance of regular follow-up.  We have had this discussion many, many times, that she needs to be seen at least every 3 months until her diabetes is under better control in order to prevent life, limb, and organ damage resulting from uncontrolled diabetes.  Unfortunately, she seems very overwhelmed and gets fairly defensive, stating she has many appointments to attend.  She estimates she attends 4-5 appointments per month.  Attempted to brainstorm solutions with patient today: - Offered to set her up with MyChart so she can message me but she says she does not trust the Internet - Advised she can leave a message with nursing staff about her sugars and I will call her back so we can titrate her insulin.   - Offered her follow-up with Dr. Raymondo Band but she does not want to be seen in person for additional appointments  For now, elected to adjust insulin such that she is taking less in the evening and more in the morning, with the goal of lessening her fasting hypoglycemia.  Will do 60 units in the morning and 40 in the evening.  Hopefully she will call me so we can adjust her insulin further.

## 2024-02-15 NOTE — Assessment & Plan Note (Signed)
 BP elevated, patient had only taken medication at home just before her appointment.  Reports better numbers at home.  No changes today.

## 2024-03-19 ENCOUNTER — Telehealth: Payer: Self-pay

## 2024-03-19 ENCOUNTER — Encounter: Payer: Self-pay | Admitting: Family Medicine

## 2024-03-19 NOTE — Telephone Encounter (Signed)
 Patient calls nurse line requesting results from 3/7 visit.   She reports a letter mailed to her with a brief summary would be alright.   Confirmed patients address.   Will forward to PCP.

## 2024-03-19 NOTE — Telephone Encounter (Signed)
 Attempted to reach patient, no answer. Will send letter  [name], MD

## 2024-03-24 ENCOUNTER — Other Ambulatory Visit: Payer: Self-pay | Admitting: Family Medicine

## 2024-04-05 ENCOUNTER — Other Ambulatory Visit: Payer: Self-pay | Admitting: Family Medicine

## 2024-04-23 DIAGNOSIS — H31093 Other chorioretinal scars, bilateral: Secondary | ICD-10-CM | POA: Diagnosis not present

## 2024-04-23 DIAGNOSIS — H35372 Puckering of macula, left eye: Secondary | ICD-10-CM | POA: Diagnosis not present

## 2024-04-23 DIAGNOSIS — E113593 Type 2 diabetes mellitus with proliferative diabetic retinopathy without macular edema, bilateral: Secondary | ICD-10-CM | POA: Diagnosis not present

## 2024-04-23 DIAGNOSIS — H26491 Other secondary cataract, right eye: Secondary | ICD-10-CM | POA: Diagnosis not present

## 2024-04-23 DIAGNOSIS — H40053 Ocular hypertension, bilateral: Secondary | ICD-10-CM | POA: Diagnosis not present

## 2024-04-23 LAB — HM DIABETES EYE EXAM

## 2024-04-26 ENCOUNTER — Other Ambulatory Visit: Payer: Self-pay | Admitting: Family Medicine

## 2024-06-19 ENCOUNTER — Other Ambulatory Visit: Payer: Self-pay | Admitting: Family Medicine

## 2024-06-19 DIAGNOSIS — E1151 Type 2 diabetes mellitus with diabetic peripheral angiopathy without gangrene: Secondary | ICD-10-CM

## 2024-06-22 MED ORDER — ACCU-CHEK GUIDE W/DEVICE KIT: PACK | 0 refills | Status: DC

## 2024-06-22 NOTE — Telephone Encounter (Signed)
 Please let patient know I am refilling her insulin  but she needs to schedule an appointment with me.   Please also clarify if she is taking the omeprazole  20mg  daily - it is not current on her medication list so I will not refill at this time. If she confirms she is taking it I will be happy to send it in.  Thanks, Laymon JINNY Legions, MD

## 2024-06-22 NOTE — Addendum Note (Signed)
 Addended by: DONAH LAYMON PARAS on: 06/22/2024 02:05 PM   Modules accepted: Orders

## 2024-07-21 ENCOUNTER — Other Ambulatory Visit: Payer: Self-pay | Admitting: Family Medicine

## 2024-07-21 DIAGNOSIS — E1151 Type 2 diabetes mellitus with diabetic peripheral angiopathy without gangrene: Secondary | ICD-10-CM

## 2024-07-26 ENCOUNTER — Ambulatory Visit: Admitting: Family Medicine

## 2024-07-30 ENCOUNTER — Telehealth: Payer: Self-pay | Admitting: Family Medicine

## 2024-07-30 NOTE — Telephone Encounter (Signed)
 Patient was identified as falling into the [place] Measure - Diabetes.   Patient was: Appointment scheduled with primary care provider in the next 30 days.

## 2024-08-10 ENCOUNTER — Ambulatory Visit: Admitting: Family Medicine

## 2024-08-19 ENCOUNTER — Other Ambulatory Visit: Payer: Self-pay

## 2024-08-21 MED ORDER — METOPROLOL TARTRATE 50 MG PO TABS: ORAL_TABLET | ORAL | 0 refills | Status: DC

## 2024-09-08 NOTE — Progress Notes (Signed)
 Destiny Morton                                          MRN: [id number]   09/08/2024   The [facility] Quality Team Specialist reviewed this patient medical record for the purposes of chart review for care gap closure. The following were reviewed: chart review for care gap closure-controlling blood pressure and glycemic status assessment.    [facility]BC[facility] Quality Team

## 2024-09-09 ENCOUNTER — Ambulatory Visit: Admitting: Family Medicine

## 2024-09-12 ENCOUNTER — Other Ambulatory Visit: Payer: Self-pay | Admitting: Family Medicine

## 2024-09-15 ENCOUNTER — Telehealth: Payer: Self-pay | Admitting: Pharmacist

## 2024-09-15 NOTE — Telephone Encounter (Signed)
 Attempted to contact patient for discussion of diabetes control and potential options for care, unfortunately patient not available.  Left HIPAA compliant voice mail requesting call back to direct phone: [phone] - encouraged her to keep upcoming scheduled visit.   Suggestions/thoughts for upcoming PCP visit 10/27 for Diabetes Gap closure A1C Direct LDL Change from NPH to alternative Basal insulin  Statin nonadherence also present based on refill hx  Total time with patient call and documentation of interaction: 8 minutes.

## 2024-09-17 NOTE — Telephone Encounter (Signed)
 Reviewed and agree with Dr Rennis plan.

## 2024-10-06 ENCOUNTER — Telehealth: Payer: Self-pay | Admitting: Family Medicine

## 2024-10-06 NOTE — Telephone Encounter (Signed)
-----   Message from Brittany McIntyre sent at 10/05/2024 11:37 AM EDT ----- Can you call patient and have her schedule follow up for her diabetes? She is well past due.  Thanks! Laymon JINNY Legions, MD

## 2024-10-06 NOTE — Telephone Encounter (Signed)
 LVM to call back.

## 2024-10-08 ENCOUNTER — Telehealth: Payer: Self-pay | Admitting: Family Medicine

## 2024-10-08 NOTE — Telephone Encounter (Signed)
 Patient was identified as falling into the [place] Measure - Diabetes.   Patient was: Left voicemail to schedule with primary care provider.

## 2024-10-11 ENCOUNTER — Telehealth: Payer: Self-pay | Admitting: Pharmacist

## 2024-10-11 NOTE — Telephone Encounter (Signed)
 Attempted to contact patient for follow-up of missed Appointments and need for PCP/Lab follow-up visit.   Left HIPAA compliant voice mail requesting call back to phone: [phone] to reschedule PCP appointment.   Total time with patient call and documentation of interaction: 7 minutes.

## 2024-10-18 ENCOUNTER — Other Ambulatory Visit: Payer: Self-pay | Admitting: Family Medicine

## 2024-10-21 DIAGNOSIS — Z0100 Encounter for examination of eyes and vision without abnormal findings: Secondary | ICD-10-CM | POA: Diagnosis not present

## 2024-10-22 ENCOUNTER — Telehealth: Payer: Self-pay | Admitting: Family Medicine

## 2024-10-22 NOTE — Telephone Encounter (Signed)
 Patient was identified as falling into the [place] Measure - Diabetes.   Patient was: Attempted to call patient, VM was full

## 2024-10-22 NOTE — Progress Notes (Signed)
 Destiny Morton                                          MRN: [id number]   10/22/2024   The [facility]BC[facility] Quality Team Specialist reviewed this patient medical record for the purposes of chart review for care gap closure. The following were reviewed: chart review for care gap closure-controlling blood pressure.    [facility]BC[facility] Quality Team

## 2024-10-22 NOTE — Progress Notes (Signed)
 ADALIZ DOBIS                                          MRN: [id number]   10/22/2024   The VBCI Quality Team Specialist reviewed this patient medical record for the purposes of chart review for care gap closure. The following were reviewed: chart review for care gap closure-glycemic status assessment.    [facility]BCI Quality Team

## 2024-11-09 ENCOUNTER — Telehealth: Payer: Self-pay

## 2024-11-09 NOTE — Telephone Encounter (Signed)
 Patient calls nurse line in regards to syringe/needle combo.  She reports she went to refill her April prescription, however the pharmacy reported the manufacturer no longer makes this brand anymore.   I called the pharmacy for alternatives, however was unable to get helpful information.   Will forward to PCP,

## 2024-11-10 NOTE — Telephone Encounter (Signed)
 Attempted to contact patient for follow-up of request for syringes with needle attached.    Left HIPAA compliant voice mail requesting call back to direct phone: [phone] I desire a discussion around use of pens with needle tips vs syringes and vials.   Patient has appt with PCP on 12/15 Perhaps this issue can be resolved sooner with pharmacist visit if patient willing.   Total time with patient call and documentation of interaction: 8 minutes.

## 2024-11-10 NOTE — Telephone Encounter (Signed)
 Hi Pete, Do you know what this is referring to? Thanks! Deshannon Seide

## 2024-11-11 ENCOUNTER — Other Ambulatory Visit: Payer: Self-pay | Admitting: Family Medicine

## 2024-11-17 ENCOUNTER — Encounter: Payer: Self-pay | Admitting: Family Medicine

## 2024-11-19 ENCOUNTER — Telehealth: Payer: Self-pay | Admitting: *Deleted

## 2024-11-19 NOTE — Telephone Encounter (Signed)
 Patient was identified as falling into the [place] Measure - Diabetes.   Patient was: Appointment already scheduled for:  11/22/2024. With PCP

## 2024-11-19 NOTE — Progress Notes (Signed)
 Destiny Morton                                          MRN: [id number]   11/19/2024   The VBCI Quality Team Specialist reviewed this patient medical record for the purposes of chart review for care gap closure. The following were reviewed: chart review for care gap closure-controlling blood pressure.    [facility]BCI Quality Team

## 2024-11-22 ENCOUNTER — Ambulatory Visit: Admitting: Family Medicine

## 2024-11-25 NOTE — Progress Notes (Signed)
 Destiny Morton                                          MRN: [id number]   11/25/2024   The VBCI Quality Team Specialist reviewed this patient medical record for the purposes of chart review for care gap closure. The following were reviewed: chart review for care gap closure-glycemic status assessment.    [facility]BC[facility] Quality Team

## 2024-12-27 ENCOUNTER — Other Ambulatory Visit: Payer: Self-pay | Admitting: Family Medicine

## 2024-12-27 NOTE — Telephone Encounter (Signed)
 Please let patient know I am refilling this medication, but she needs to schedule an appointment with me.   If she does not keep that appointment we will start the dismissal process - she missed 5 appointments with me in 2025 alone, and completed only one appointment here in the [facility] in March 2025. Her diabetes is poorly controlled due to her lack of follow up.  Thanks, Laymon JINNY Legions, MD

## 2024-12-29 NOTE — Progress Notes (Signed)
 Destiny Morton                                          MRN: [id number]   12/29/2024   The VBCI Quality Team Specialist reviewed this patient medical record for the purposes of chart review for care gap closure. The following were reviewed: chart review for care gap closure-glycemic status assessment.    [facility]BCI Quality Team

## 2024-12-29 NOTE — Progress Notes (Deleted)
 Destiny Morton                                          MRN: [id number]   12/29/2024   The VBCI Quality Team Specialist reviewed this patient medical record for the purposes of chart review for care gap closure. The following were reviewed: chart review for care gap closure-diabetic eye exam.    VBCI Quality Team

## 2024-12-29 NOTE — Progress Notes (Signed)
 Destiny Morton                                          MRN: [id number]   12/29/2024   The VBCI Quality Team Specialist reviewed this patient medical record for the purposes of chart review for care gap closure. The following were reviewed: chart review for care gap closure-controlling blood pressure.    VBCI Quality Team

## 2025-01-03 NOTE — Progress Notes (Signed)
 Destiny Morton                                          MRN: [id number]   01/03/2025   The VBCI Quality Team Specialist reviewed this patient medical record for the purposes of chart review for care gap closure. The following were reviewed: chart review for care gap closure-controlling blood pressure.    VBCI Quality Team

## 2025-01-05 ENCOUNTER — Other Ambulatory Visit: Payer: Self-pay | Admitting: Family Medicine

## 2025-01-27 ENCOUNTER — Ambulatory Visit: Admitting: Family Medicine
